# Patient Record
Sex: Female | Born: 1995 | Race: White | Hispanic: No | Smoking: Never smoker
Health system: Southern US, Community
[De-identification: ages and names within clinical notes are randomized; demographics above are authoritative.]

## PROBLEM LIST (undated history)

## (undated) ENCOUNTER — Inpatient Hospital Stay (HOSPITAL_COMMUNITY): Payer: Self-pay

## (undated) DIAGNOSIS — M1990 Unspecified osteoarthritis, unspecified site: Secondary | ICD-10-CM

## (undated) DIAGNOSIS — F32A Depression, unspecified: Secondary | ICD-10-CM

## (undated) DIAGNOSIS — F329 Major depressive disorder, single episode, unspecified: Secondary | ICD-10-CM

## (undated) DIAGNOSIS — F419 Anxiety disorder, unspecified: Secondary | ICD-10-CM

## (undated) DIAGNOSIS — F209 Schizophrenia, unspecified: Secondary | ICD-10-CM

## (undated) HISTORY — PX: ABDOMINAL HYSTERECTOMY: SHX81

## (undated) HISTORY — PX: TUBAL LIGATION: SHX77

## (undated) HISTORY — PX: HERNIA REPAIR: SHX51

---

## 2016-10-08 ENCOUNTER — Encounter: Payer: Self-pay | Admitting: Obstetrics and Gynecology

## 2016-10-22 ENCOUNTER — Encounter: Payer: Self-pay | Admitting: *Deleted

## 2016-10-25 ENCOUNTER — Ambulatory Visit (INDEPENDENT_AMBULATORY_CARE_PROVIDER_SITE_OTHER): Payer: Managed Care, Other (non HMO) | Admitting: Obstetrics and Gynecology

## 2016-10-25 ENCOUNTER — Encounter: Payer: Self-pay | Admitting: Obstetrics and Gynecology

## 2016-10-25 VITALS — BP 100/68 | HR 68 | Ht 60.0 in | Wt 99.2 lb

## 2016-10-25 DIAGNOSIS — G8929 Other chronic pain: Secondary | ICD-10-CM | POA: Diagnosis not present

## 2016-10-25 DIAGNOSIS — R102 Pelvic and perineal pain: Secondary | ICD-10-CM | POA: Diagnosis not present

## 2016-10-25 DIAGNOSIS — Z3202 Encounter for pregnancy test, result negative: Secondary | ICD-10-CM | POA: Diagnosis not present

## 2016-10-25 MED ORDER — METRONIDAZOLE 500 MG PO TABS: 500.0000 mg | ORAL_TABLET | Freq: Two times a day (BID) | ORAL | 0 refills | Status: AC

## 2016-10-25 MED ORDER — POLYETHYLENE GLYCOL 3350 17 GM/SCOOP PO POWD: 17.0000 g | Freq: Every day | ORAL | 99 refills | Status: DC

## 2016-10-25 NOTE — Progress Notes (Addendum)
Patient ID: Jessica Poole, female   DOB: 05/03/1995, 21 y.o.   MRN: [id number]   [facility]  @DATE @            Patient name: Jessica Poole MRN [id number]  Date of birth: 09/24/1995  CC & HPI:  Jessica Poole is a 21 y.o. female presenting today for an annual exam and gradually worsening, constant, left sided, pelvic pain for the last year. Pt also endorses abdominal cramping and dyspareunia. Her pain is different than when she first started her periods at age 21. She has one child who is almost two. She never had pain like this before and reports getting the Mirena after giving birth to her child. She had the Mirena for ~ 3 months before having it removed. She had a regular period last month and then had intermittent bleeding last week. She is currently in pain. She notes her pain with intercourse is mainly with insertion. Her pain is exacerbated closer to her menstrual cycle.   She is in the [employer] and states her pain affects her ability to complete certain tasksThat are essential to her work as an [employer]  ROS:  ROS (+) pelvic pain (+) abdominal cramping (+) dyspareunia All systems are negative except as noted in the HPI and PMH.   Pertinent History Reviewed:   Reviewed: Significant for cesarean section Medical        History reviewed. No pertinent past medical history.                            Surgical Hx:    Past Surgical History:  Procedure Laterality Date  . CESAREAN SECTION    . HERNIA REPAIR     21 yrs old   Medications: Reviewed & Updated - see associated section                       Current Outpatient Prescriptions:  .  norgestrel-ethinyl estradiol (LO/OVRAL,CRYSELLE) 0.3-30 MG-MCG tablet, Take 1 tablet by mouth daily., Disp: , Rfl:    Social History: Reviewed -  reports that she has never smoked. She has never used smokeless tobacco.  Objective Findings:  Vitals: Blood pressure 100/68, pulse 68, height 5' (1.524 m), weight 99 lb  3.2 oz (45 kg), last menstrual period 09/18/2016. Body mass index is 19.37 kg/m.   Physical Examination: General appearance - alert, well appearing, and in no distress, oriented to person, place, and time and normal appearing weight Mental status - alert, oriented to person, place, and time, normal mood, behavior, speech, dress, motor activity, and thought processes, affect appropriate to mood Abdomen - soft, nondistended, no masses or organomegaly, Pain inside her left anterior superior iliac crest upon palpation there is tenderness that's at least several centimeters above the incision. It is hard for me to believe that the the surgical scar from the old C-section reach this far up on the fascia Pelvic - normal external genitalia, vulva, vagina, cervix, uterus and adnexa,  VULVA: normal appearing vulva with no masses, tenderness or lesions,  VAGINA: normal appearing vagina with normal color and discharge, no lesions,  CERVIX: normal appearing cervix with moderate amount of white ,"bubbly" secretions, wet prep and KOH were done UTERUS: uterus is normal size, shape, consistency and nontender, very anteflexed uterus, mobile the pain was significant on initial exam but the uterus gradually eased into a is uncomfortable position during the  exam ADNEXA: normal adnexa in size, right nontender and no masses, left adnexa tenderness  Positive whiff test. Negative trichomoniasis and negative yeast.   Assessment & Plan:   A:  1. Chronic pelvic pain  2. BV mild  P:  1. Pt is to keep a pain journal  2. Will discuss further options after monitoring pain 3. She is to take a stool softener and use MiraLAX 4. Was given an ACOG brochure on pelvic pain 5. Antibiotic metronidazole oral 7 days 6 patient may be considered for laparoscopy  By signing my name below, I, Diona Browner, attest that this documentation has been prepared under the direction and in the presence of Tilda Burrow,  MD. Electronically Signed: Diona Browner, Medical Scribe. 10/25/16. 2:02 PM.  I personally performed the services described in this documentation, which was SCRIBED in my presence. The recorded information has been reviewed and considered accurate. It has been edited as necessary during review. Tilda Burrow, MD

## 2016-10-27 DIAGNOSIS — R102 Pelvic and perineal pain: Principal | ICD-10-CM

## 2016-10-27 DIAGNOSIS — G8929 Other chronic pain: Secondary | ICD-10-CM | POA: Insufficient documentation

## 2016-10-27 LAB — POCT URINE PREGNANCY: PREG TEST UR: NEGATIVE

## 2016-11-22 ENCOUNTER — Ambulatory Visit (INDEPENDENT_AMBULATORY_CARE_PROVIDER_SITE_OTHER): Payer: Managed Care, Other (non HMO) | Admitting: Obstetrics and Gynecology

## 2016-11-22 ENCOUNTER — Encounter: Payer: Self-pay | Admitting: Obstetrics and Gynecology

## 2016-11-22 VITALS — BP 90/48 | HR 86 | Ht 60.0 in | Wt 100.6 lb

## 2016-11-22 DIAGNOSIS — G8929 Other chronic pain: Secondary | ICD-10-CM

## 2016-11-22 DIAGNOSIS — R102 Pelvic and perineal pain: Secondary | ICD-10-CM | POA: Diagnosis not present

## 2016-11-22 NOTE — Progress Notes (Addendum)
[facility] Visit  11/22/2016            Patient name: Jessica Poole MRN [id number]  Date of birth: Mar 22, 1995  CC & HPI:  Jessica Poole is a 21 y.o. female presenting today for f/u of chronic left-sided  pelvic pain occurring for 1 year. Pt has associated symptoms of abdominal cramping and dyspareunia (per chart review). Pt was seen in office for initial visit on 10/25/16, and advised to keep a pain journal, begin taking MiraLAX, and tx for mild BV: metronidazole. PT has taken ibuprofen with no relief. Pt kept pain journal for 2 weeks, beginning on 10/25/16. Pain Journal Entries: On 9/12, pain increased by urination and abd. wall tightening, pain goes into the night 15-17 minimal pain, returned upon awakening on 9/18 9/20 pt felt pulling sensation on left side  Pt rates pain 10/10, and says she was unable to leave her couch on the first day of her period due to the pain. The pain is the same during her period as when she  is not on her period. Pt says her period has been very heavy, which is unusual for her. Note patient on oral contraceptive  She went through 18 tampons in 3 days. Today her pain is mild. Her bowel movements ave improved, and increased in frequency (once a day since beginning MiraLAX). Pt denies diarrhea, and says her BM comes out easily.   Pt had Mirena inserted 10 wks after giving birth, and noticed her pain right after. IUD was in for 3 months before being removed.  Pt takes Cryselle for birth control. She has tried taking active pills for 4 weeks, but states she still bled heavily. Her last menstrual period began on 11/09/16.  Pt states the pain is still affecting her work with the [employer]. 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 Patient has an evasive tendency with regards to answering questions that is difficult to interpret All systems are negative except as noted in the  HPI and PMH.    Pertinent History Reviewed:   Reviewed: Significant for C-section, right sided inguinal hernia repair Medical        History reviewed. No pertinent past medical history.                            Surgical Hx:    Past Surgical History:  Procedure Laterality Date  . CESAREAN SECTION    . HERNIA REPAIR     21 yrs old   Medications: Reviewed & Updated - see associated section                       Current Outpatient Prescriptions:  .  polyethylene glycol powder (MIRALAX) powder, Take 17 g by mouth daily. To prevent constipation (Patient taking differently: Take 17 g by mouth as needed. To prevent constipation), Disp: 255 g, Rfl: prn   Social History: Reviewed -  reports that she has never smoked. She has never used smokeless tobacco.  Objective Findings:  Vitals: Blood pressure (!) 90/48, pulse 86, height 5' (1.524 m), weight 100 lb 9.6 oz (45.6 kg).  Physical Examination: General appearance - alert, well appearing, and in no distressMultiple tattoos, facial piercings. Patient has a nervous habit of chewing on her lip rings Mental status - alert, oriented to person, place, and  time Abdomen soft no masses or hernias appreciable  Pelvic -  VULVA: normal appearing vulva with no masses, tenderness or lesions, piercings with ring in place  VAGINA: normal appearing vagina with normal color and discharge, no lesions,  CERVIX: normal appearing cervix without discharge or lesions,  UTERUS: anteverted, mobile Careful bimanual exam to sort out her pain describes discomfort of vaginal sidewall contact as "normal " discomfort of GYN exam, bladder pressure unremarkable uterus anteflexed adnexa without masses ADNEXA: normal adnexa in size, nontender and no masses  Probably totally normal, worried well   Assessment & Plan:   A:  1. Chronic pelvic pain poorly defined by pt  P:  1. Pt to consider  alternatives, including continuous OCP at 30 mcg level discussedTo address  alleged heavy periods, and does decide how much of a workup she feels is appropriate. Given the vague nature of her complaints and the fact that they do not have a cyclic component that responds to her menstrual cycle, and that she is on oral contraceptives are not sure there is a GYN component to this chronic discomfort, but I do not wish to ignore her complaints or minimize them. the patient to consider options and return in 1 week 2. Return in 1 week for decision on laparoscopy  Note: Pt given Laparoscopy brochure The provider spent over 25 minutes with the visit , including previsit review, and documentation,with >than 50% spent in counseling and coordination of care.    By signing my name below, I, Izna Ahmed, attest that this documentation has been prepared under the direction and in the presence of Tilda Burrow, MD. Electronically Signed: Redge Gainer, Medical Scribe. 11/22/16. 4:49 PM.  I personally performed the services described in this documentation, which was SCRIBED in my presence. The recorded information has been reviewed and considered accurate. It has been edited as necessary during review. Tilda Burrow, MD

## 2016-12-01 ENCOUNTER — Ambulatory Visit (INDEPENDENT_AMBULATORY_CARE_PROVIDER_SITE_OTHER): Payer: Managed Care, Other (non HMO) | Admitting: Obstetrics and Gynecology

## 2016-12-01 ENCOUNTER — Encounter: Payer: Self-pay | Admitting: Obstetrics and Gynecology

## 2016-12-01 VITALS — BP 100/80 | HR 74 | Wt 103.6 lb

## 2016-12-01 DIAGNOSIS — R1032 Left lower quadrant pain: Secondary | ICD-10-CM

## 2016-12-01 DIAGNOSIS — G8929 Other chronic pain: Secondary | ICD-10-CM | POA: Diagnosis not present

## 2016-12-01 NOTE — Progress Notes (Signed)
Patient ID: Jessica Poole, female   DOB: 02/21/1995, 21 y.o.   MRN: [id number] Preoperative History and Physical  Jessica Poole is a 21 y.o. G1P1001 here for surgical management  of chronic left-sided pelvic pain occurring for 1 year. Pt has associated symptoms of abdominal cramping and dyspareunia (per chart review). She was last seen 11/22/2016 for same symptoms. She states that palpation to the area will increase her discomfort. 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 No significant preoperative concerns.  Proposed surgery: diagnostic laparoscopy, possible laparoscopic salpingo-oophorectomy (left)  History reviewed. No pertinent past medical history. Past Surgical History:  Procedure Laterality Date  . CESAREAN SECTION    . HERNIA REPAIR     21 yrs old   OB History  Gravida Para Term Preterm AB Living  1 1 1  0 0 1  SAB TAB Ectopic Multiple Live Births          1    # Outcome Date GA Lbr Len/2nd Weight Sex Delivery Anes PTL Lv  1 Term     M CS-LTranv   LIV    Patient denies any other pertinent gynecologic issues.   Current Outpatient Prescriptions on File Prior to Visit  Medication Sig Dispense Refill  . polyethylene glycol powder (MIRALAX) powder Take 17 g by mouth daily. To prevent constipation (Patient not taking: Reported on 12/01/2016) 255 g prn   No current facility-administered medications on file prior to visit.    Allergies  Allergen Reactions  . Latex Rash    Social History:   reports that she has never smoked. She has never used smokeless tobacco. She reports that she drinks alcohol. She reports that she does not use drugs.  Family History  Problem Relation Age of Onset  . Heart disease Paternal Grandfather   . Depression Maternal Grandmother   . Drug abuse Father   . Hypertension Father     Review of Systems: Noncontributory  PHYSICAL EXAM: Blood pressure 100/80, pulse 74, weight 103 lb 9.6 oz  (47 kg), last menstrual period 11/09/2016. General appearance - alert, well appearing, and in no distress, multiple nervous ticks, somewhat poor hygiene  Chest - clear to auscultation, no wheezes, rales or rhonchi, symmetric air entry Heart - normal rate and regular rhythm Abdomen - soft, nontender, nondistended, no masses or organomegaly Pelvic - examination: small 1.5 - 2 cm firm mass causing severe pain Extremities - peripheral pulses normal, no pedal edema, no clubbing or cyanosis  Labs: No results found for this or any previous visit (from the past 336 hour(s)).  Imaging Studies: No results found.  Assessment: Patient Active Problem List   Diagnosis Date Noted  . Chronic pelvic pain in female 10/27/2016  . Pregnancy examination or test, negative result 10/25/2016    Plan: Patient will undergo surgical management with diagnostic laparoscopy, possible laparoscopic left salpingo-oophorectomy   .mec 12/01/2016 3:20 PM  By signing my name below, I, Diona BrownerJennifer Gorman, attest that this documentation has been prepared under the direction and in the presence of Tilda BurrowFerguson, Levorn Oleski V, MD. Electronically Signed: Diona BrownerJennifer Gorman, Medical Scribe. 12/01/16. 3:20 PM.  I personally performed the services described in this documentation, which was SCRIBED in my presence. The recorded information has been reviewed and considered accurate. It has been edited as necessary during review. Tilda BurrowFERGUSON,Aviyon Hocevar V, MD

## 2016-12-13 NOTE — Patient Instructions (Signed)
Jessica Poole  12/13/2016     @PREFPERIOPPHARMACY @   Your procedure is scheduled on  12/21/2016 .  Report to [name] at  715   A.M.  Call this number if you have problems the morning of surgery:  [phone]   Remember:  Do not eat food or drink liquids after midnight.  Take these medicines the morning of surgery with A SIP OF WATER None   Do not wear jewelry, make-up or nail polish.  Do not wear lotions, powders, or perfumes, or deoderant.  Do not shave 48 hours prior to surgery.  Men may shave face and neck.  Do not bring valuables to the hospital.  [facility] is not responsible for any belongings or valuables.  Contacts, dentures or bridgework may not be worn into surgery.  Leave your suitcase in the car.  After surgery it may be brought to your room.  For patients admitted to the hospital, discharge time will be determined by your treatment team.  Patients discharged the day of surgery will not be allowed to drive home.   Name and phone number of your driver:   family Special instructions:  None  Please read over the following fact sheets that you were given. Anesthesia Post-op Instructions and Care and Recovery After Surgery       Unilateral Salpingo-Oophorectomy Unilateral salpingo-oophorectomy is the surgical removal of one fallopian tube and ovary. The ovaries are small organs that produce eggs in women. The fallopian tubes transport the egg from the ovary to the womb (uterus). A unilateral salpingo-oophorectomy may be done for various reasons, including:  Infection in the fallopian tube and ovary.  Scar tissue in the fallopian tube and ovary (adhesions).  A cyst or tumor on the ovary.  A need to remove the fallopian tube and ovary when removing the uterus.  Cancer of the fallopian tube or ovary.  The removal of one fallopian tube and ovary will not prevent you from becoming pregnant, put you into menopause, or cause problems with  your menstrual periods or sex drive. Tell a health care provider about:  Any allergies you have.  All medicines you are taking, including vitamins, herbs, eye drops, creams, and over-the-counter medicines.  Previous problems you or family members have had with the use of anesthetics.  Any blood disorders you have.  Previous surgeries you have had.  Any medical conditions you have. What are the risks? Generally, this is a safe procedure. However, as with any procedure, complications can occur. Possible complications include:  Injury to surrounding organs.  Bleeding.  Infection.  Blood clots in the legs or lungs.  Problems related to anesthesia.  What happens before the procedure?  Ask your health care provider about changing or stopping your regular medicines. You may need to stop taking certain medicines, such as aspirin or blood thinners, at least 1 week before the surgery.  Do not eat or drink anything for at least 8 hours before the surgery.  If you smoke, do not smoke for at least 2 weeks before the surgery.  Make plans to have someone drive you home after the procedure or after your hospital stay. Also arrange for someone to help you with activities during recovery. What happens during the procedure?  You will be given medicine to help you relax before the procedure (sedative). You will then be given medicine to make you sleep through the procedure (general anesthetic). These medicines will  be given through an IV access tube that is put into one of your veins.  Once you are asleep, your lower abdomen will be shaved and cleaned. A thin, flexible tube (catheter) will be placed in your bladder.  The surgeon may use a laparoscopic, robotic, or open technique for this surgery: ? In the laparoscopic technique, the surgery is done through two small cuts (incisions) in the abdomen. A thin, lighted tube with a tiny camera on the end (laparoscope) is inserted into one of the  incisions. The tools needed for the procedure are put through the other incision. ? A robotic technique may be chosen to perform complex surgery in a small space. In the robotic technique, small incisions are made. A camera and surgical instruments are passed through the incisions. Surgical instruments are controlled with the help of a robotic arm. ? In the open technique, the surgery is done through one large incision in the abdomen.  Using any of these techniques, the surgeon will remove the fallopian tube and ovary. The blood vessels will be clamped and tied.  The surgeon will then use staples or stitches to close the incision or incisions. What happens after the procedure?  You will be taken to a recovery area where your progress will be monitored for 1-3 hours. Your blood pressure, pulse, and temperature will be checked often. You will remain in the recovery area until you are stable and waking up.  If the laparoscopic technique was used, you may be allowed to go home after several hours. You may have some shoulder pain. This is normal and usually goes away in a day or two.  If the open technique was used, you will be admitted to the hospital for a couple of days.  You will be given pain medicine as necessary.  The IV tube and catheter will be removed before you are discharged. This information is not intended to replace advice given to you by your health care provider. Make sure you discuss any questions you have with your health care provider. Document Released: 11/29/2008 Document Revised: 01/02/2016 Document Reviewed: 07/26/2012 Elsevier Interactive Patient Education  2018 [employer] Unilateral Salpingo-Oophorectomy, Care After Refer to this sheet in the next few weeks. These instructions provide you with information on caring for yourself after your procedure. Your health care provider may also give you more specific instructions. Your treatment has been planned according to  current medical practices, but problems sometimes occur. Call your health care provider if you have any problems or questions after your procedure. What can I expect after the procedure? After the procedure, it is typical to have the following:  Abdominal pain that can be controlled with pain medicine.  Vaginal spotting.  Constipation.  Follow these instructions at home:  Get plenty of rest and sleep.  Only take over-the-counter or prescription medicines as directed by your health care provider. Do not take aspirin. It can cause bleeding.  Keep incision areas clean and dry. Remove or change any bandages (dressings) only as directed by your health care provider.  Follow your health care provider's advice regarding diet.  Drink enough fluids to keep your urine clear or pale yellow.  Limit exercise and activities as directed by your health care provider. Do not lift anything heavier than 5 pounds (2.3 kg) until your health care provider approves.  Do not drive until your health care provider approves.  Do not drink alcohol until your health care provider approves.  Do not have sexual intercourse  until your health care provider says it is OK.  Take your temperature twice a day and write it down.  If you become constipated, you may: ? Ask your health care provider about taking a mild laxative. ? Add more fruit and bran to your diet. ? Drink more fluids.  Follow up with your health care provider as directed. Contact a health care provider if:  You have swelling or redness in the incision area.  You develop a rash.  You feel lightheaded.  You have pain that is not controlled with medicine.  You have pain, swelling, or redness where the IV access tube was placed. Get help right away if:  You have a fever.  You develop increasing abdominal pain.  You see pus coming out of the incision, or the incision is separating.  You notice a bad smell coming from the wound or  dressing.  You have excessive vaginal bleeding.  You feel sick to your stomach (nauseous) and vomit.  You have leg or chest pain.  You have pain when you urinate.  You develop shortness of breath.  You pass out. This information is not intended to replace advice given to you by your health care provider. Make sure you discuss any questions you have with your health care provider. Document Released: 11/28/2008 Document Revised: 01/02/2016 Document Reviewed: 07/26/2012 Elsevier Interactive Patient Education  2018 [employer].  General Anesthesia, Adult General anesthesia is the use of medicines to make a person "go to sleep" (be unconscious) for a medical procedure. General anesthesia is often recommended when a procedure:  Is long.  Requires you to be still or in an unusual position.  Is major and can cause you to lose blood.  Is impossible to do without general anesthesia.  The medicines used for general anesthesia are called general anesthetics. In addition to making you sleep, the medicines:  Prevent pain.  Control your blood pressure.  Relax your muscles.  Tell a health care provider about:  Any allergies you have.  All medicines you are taking, including vitamins, herbs, eye drops, creams, and over-the-counter medicines.  Any problems you or family members have had with anesthetic medicines.  Types of anesthetics you have had in the past.  Any bleeding disorders you have.  Any surgeries you have had.  Any medical conditions you have.  Any history of heart or lung conditions, such as heart failure, sleep apnea, or chronic obstructive pulmonary disease (COPD).  Whether you are pregnant or may be pregnant.  Whether you use tobacco, alcohol, marijuana, or street drugs.  Any history of Financial [employer].  Any history of depression or anxiety. What are the risks? Generally, this is a safe procedure. However, problems may occur, including:  Allergic  reaction to anesthetics.  Lung and heart problems.  Inhaling food or liquids from your stomach into your lungs (aspiration).  Injury to nerves.  Waking up during your procedure and being unable to move (rare).  Extreme agitation or a state of mental confusion (delirium) when you wake up from the anesthetic.  Air in the bloodstream, which can lead to stroke.  These problems are more likely to develop if you are having a major surgery or if you have an advanced medical condition. You can prevent some of these complications by answering all of your health care provider's questions thoroughly and by following all pre-procedure instructions. General anesthesia can cause side effects, including:  Nausea or vomiting  A sore throat from the breathing tube.  Feeling cold or shivery.  Feeling tired, washed out, or achy.  Sleepiness or drowsiness.  Confusion or agitation.  What happens before the procedure? Staying hydrated Follow instructions from your health care provider about hydration, which may include:  Up to 2 hours before the procedure - you may continue to drink clear liquids, such as water, clear fruit juice, black coffee, and plain tea.  Eating and drinking restrictions Follow instructions from your health care provider about eating and drinking, which may include:  8 hours before the procedure - stop eating heavy meals or foods such as meat, fried foods, or fatty foods.  6 hours before the procedure - stop eating light meals or foods, such as toast or cereal.  6 hours before the procedure - stop drinking milk or drinks that contain milk.  2 hours before the procedure - stop drinking clear liquids.  Medicines  Ask your health care provider about: ? Changing or stopping your regular medicines. This is especially important if you are taking diabetes medicines or blood thinners. ? Taking medicines such as aspirin and ibuprofen. These medicines can thin your blood. Do  not take these medicines before your procedure if your health care provider instructs you not to. ? Taking new dietary supplements or medicines. Do not take these during the week before your procedure unless your health care provider approves them.  If you are told to take a medicine or to continue taking a medicine on the day of the procedure, take the medicine with sips of water. General instructions   Ask if you will be going home the same day, the following day, or after a longer hospital stay. ? Plan to have someone take you home. ? Plan to have someone stay with you for the first 24 hours after you leave the hospital or clinic.  For 3-6 weeks before the procedure, try not to use any tobacco products, such as cigarettes, chewing tobacco, and e-cigarettes.  You may brush your teeth on the morning of the procedure, but make sure to spit out the toothpaste. What happens during the procedure?  You will be given anesthetics through a mask and through an IV tube in one of your veins.  You may receive medicine to help you relax (sedative).  As soon as you are asleep, a breathing tube may be used to help you breathe.  An anesthesia specialist will stay with you throughout the procedure. He or she will help keep you comfortable and safe by continuing to give you medicines and adjusting the amount of medicine that you get. He or she will also watch your blood pressure, pulse, and oxygen levels to make sure that the anesthetics do not cause any problems.  If a breathing tube was used to help you breathe, it will be removed before you wake up. The procedure may vary among health care providers and hospitals. What happens after the procedure?  You will wake up, often slowly, after the procedure is complete, usually in a recovery area.  Your blood pressure, heart rate, breathing rate, and blood oxygen level will be monitored until the medicines you were given have worn off.  You may be given  medicine to help you calm down if you feel anxious or agitated.  If you will be going home the same day, your health care provider may check to make sure you can stand, drink, and urinate.  Your health care providers will treat your pain and side effects before you go home.  Do not drive for 24 hours if you received a sedative.  You may: ? Feel nauseous and vomit. ? Have a sore throat. ? Have mental slowness. ? Feel cold or shivery. ? Feel sleepy. ? Feel tired. ? Feel sore or achy, even in parts of your body where you did not have surgery. This information is not intended to replace advice given to you by your health care provider. Make sure you discuss any questions you have with your health care provider. Document Released: 05/11/2007 Document Revised: 07/15/2015 Document Reviewed: 01/16/2015 Elsevier Interactive Patient Education  2018 [employer]. General Anesthesia, Adult, Care After These instructions provide you with information about caring for yourself after your procedure. Your health care provider may also give you more specific instructions. Your treatment has been planned according to current medical practices, but problems sometimes occur. Call your health care provider if you have any problems or questions after your procedure. What can I expect after the procedure? After the procedure, it is common to have:  Vomiting.  A sore throat.  Mental slowness.  It is common to feel:  Nauseous.  Cold or shivery.  Sleepy.  Tired.  Sore or achy, even in parts of your body where you did not have surgery.  Follow these instructions at home: For at least 24 hours after the procedure:  Do not: ? Participate in activities where you could fall or become injured. ? Drive. ? Use heavy machinery. ? Drink alcohol. ? Take sleeping pills or medicines that cause drowsiness. ? Make important decisions or sign legal documents. ? Take care of children on your  own.  Rest. Eating and drinking  If you vomit, drink water, juice, or soup when you can drink without vomiting.  Drink enough fluid to keep your urine clear or pale yellow.  Make sure you have little or no nausea before eating solid foods.  Follow the diet recommended by your health care provider. General instructions  Have a responsible adult stay with you until you are awake and alert.  Return to your normal activities as told by your health care provider. Ask your health care provider what activities are safe for you.  Take over-the-counter and prescription medicines only as told by your health care provider.  If you smoke, do not smoke without supervision.  Keep all follow-up visits as told by your health care provider. This is important. Contact a health care provider if:  You continue to have nausea or vomiting at home, and medicines are not helpful.  You cannot drink fluids or start eating again.  You cannot urinate after 8-12 hours.  You develop a skin rash.  You have fever.  You have increasing redness at the site of your procedure. Get help right away if:  You have difficulty breathing.  You have chest pain.  You have unexpected bleeding.  You feel that you are having a life-threatening or urgent problem. This information is not intended to replace advice given to you by your health care provider. Make sure you discuss any questions you have with your health care provider. Document Released: 05/10/2000 Document Revised: 07/07/2015 Document Reviewed: 01/16/2015 Elsevier Interactive Patient Education  [employer].

## 2016-12-15 ENCOUNTER — Encounter (HOSPITAL_COMMUNITY)
Admission: RE | Admit: 2016-12-15 | Discharge: 2016-12-15 | Disposition: A | Discharge status: Home / Self Care | Payer: 59 | Source: Ambulatory Visit | Attending: Obstetrics and Gynecology | Admitting: Obstetrics and Gynecology

## 2016-12-15 ENCOUNTER — Encounter (HOSPITAL_COMMUNITY): Payer: Self-pay

## 2016-12-15 ENCOUNTER — Other Ambulatory Visit: Payer: Self-pay | Admitting: Obstetrics and Gynecology

## 2016-12-15 DIAGNOSIS — Z01818 Encounter for other preprocedural examination: Secondary | ICD-10-CM | POA: Diagnosis present

## 2016-12-15 HISTORY — DX: Depression, unspecified: F32.A

## 2016-12-15 HISTORY — DX: Major depressive disorder, single episode, unspecified: F32.9

## 2016-12-15 HISTORY — DX: Unspecified osteoarthritis, unspecified site: M19.90

## 2016-12-15 HISTORY — DX: Anxiety disorder, unspecified: F41.9

## 2016-12-15 LAB — CBC
HEMATOCRIT: 41 % (ref 36.0–46.0)
Hemoglobin: 14 g/dL (ref 12.0–15.0)
MCH: 32.3 pg (ref 26.0–34.0)
MCHC: 34.1 g/dL (ref 30.0–36.0)
MCV: 94.5 fL (ref 78.0–100.0)
PLATELETS: 191 10*3/uL (ref 150–400)
RBC: 4.34 MIL/uL (ref 3.87–5.11)
RDW: 12.3 % (ref 11.5–15.5)
WBC: 5.7 10*3/uL (ref 4.0–10.5)

## 2016-12-15 LAB — COMPREHENSIVE METABOLIC PANEL
ALBUMIN: 4.6 g/dL (ref 3.5–5.0)
ALT: 16 U/L (ref 14–54)
ANION GAP: 10 (ref 5–15)
AST: 26 U/L (ref 15–41)
Alkaline Phosphatase: 72 U/L (ref 38–126)
BILIRUBIN TOTAL: 0.6 mg/dL (ref 0.3–1.2)
BUN: 15 mg/dL (ref 6–20)
CHLORIDE: 106 mmol/L (ref 101–111)
CO2: 23 mmol/L (ref 22–32)
Calcium: 9.2 mg/dL (ref 8.9–10.3)
Creatinine, Ser: 0.67 mg/dL (ref 0.44–1.00)
GFR calc Af Amer: >60 mL/min (ref >60)
GFR calc non Af Amer: >60 mL/min (ref >60)
GLUCOSE: 77 mg/dL (ref 65–99)
POTASSIUM: 4 mmol/L (ref 3.5–5.1)
Sodium: 139 mmol/L (ref 135–145)
TOTAL PROTEIN: 7.9 g/dL (ref 6.5–8.1)

## 2016-12-15 NOTE — Progress Notes (Unsigned)
Preoperative History and Physical  Jessica CotaSamantha B Jones is a 21 y.o. G1P1001 here for surgical management of chronic left-sided pelvic pain occurring for 1 year. Pt has associated symptoms of abdominal cramping and dyspareunia (per chart review). She was last seen 11/22/2016 for same symptoms. She states that palpation to the area will increase her discomfort. 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 No significant preoperative concerns.  Proposed surgery: diagnostic laparoscopy, possible laparoscopic salpingo-oophorectomy (left)  History reviewed. No pertinent past medical history.      Past Surgical History:  Procedure Laterality Date  . CESAREAN SECTION    . HERNIA REPAIR     21 yrs old                   OB History  Gravida Para Term Preterm AB Living  1 1 1  0 0 1  SAB TAB Ectopic Multiple Live Births          1    # Outcome Date GA Lbr Len/2nd Weight Sex Delivery Anes PTL Lv  1 Term     M CS-LTranv   LIV    Patient denies any other pertinent gynecologic issues.         Current Outpatient Prescriptions on File Prior to Visit  Medication Sig Dispense Refill  . polyethylene glycol powder (MIRALAX) powder Take 17 g by mouth daily. To prevent constipation (Patient not taking: Reported on 12/01/2016) 255 g prn   No current facility-administered medications on file prior to visit.        Allergies  Allergen Reactions  . Latex Rash    Social History:   reports that she has never smoked. She has never used smokeless tobacco. She reports that she drinks alcohol. She reports that she does not use drugs.       Family History  Problem Relation Age of Onset  . Heart disease Paternal Grandfather   . Depression Maternal Grandmother   . Drug abuse Father   . Hypertension Father     Review of Systems: Noncontributory  PHYSICAL EXAM: Blood pressure 100/80, pulse 74, weight 103 lb 9.6 oz (47 kg),  last menstrual period 11/09/2016. General appearance - alert, well appearing, and in no distress, multiple nervous ticks, somewhat poor hygiene  Chest - clear to auscultation, no wheezes, rales or rhonchi, symmetric air entry Heart - normal rate and regular rhythm Abdomen - soft, nontender, nondistended, no masses or organomegaly Pelvic - examination: small 1.5 - 2 cm firm mass causing severe pain Extremities - peripheral pulses normal, no pedal edema, no clubbing or cyanosis  Labs: No results found for this or any previous visit (from the past 336 hour(s)).  Imaging Studies: ImagingResults  No results found.    Assessment:     Patient Active Problem List   Diagnosis Date Noted  . Chronic pelvic pain in female 10/27/2016  . Pregnancy examination or test, negative result 10/25/2016    Plan: Patient will undergo surgical management with diagnostic laparoscopy, possible laparoscopic left salpingo-oophorectomy   .mec 12/01/2016 3:20 PM  By signing my name below, I, Diona BrownerJennifer Gorman, attest that this documentation has been prepared under the direction and in the presence of Tilda BurrowFerguson, Shayra Anton V, MD. Electronically Signed: Diona BrownerJennifer Gorman, Medical Scribe. 12/01/16. 3:20 PM.  I personally performed the services described in this documentation, which was SCRIBED in my presence. The recorded information has been reviewed and considered accurate. It has been  edited as necessary during review. Tilda Burrow, MD

## 2016-12-21 ENCOUNTER — Ambulatory Visit (HOSPITAL_COMMUNITY): Payer: 59 | Admitting: Anesthesiology

## 2016-12-21 ENCOUNTER — Ambulatory Visit (HOSPITAL_COMMUNITY)
Admission: RE | Admit: 2016-12-21 | Discharge: 2016-12-21 | Disposition: A | Discharge status: Home / Self Care | Payer: 59 | Source: Ambulatory Visit | Attending: Obstetrics and Gynecology | Admitting: Obstetrics and Gynecology

## 2016-12-21 ENCOUNTER — Encounter (HOSPITAL_COMMUNITY): Payer: Self-pay | Admitting: *Deleted

## 2016-12-21 ENCOUNTER — Encounter (HOSPITAL_COMMUNITY)
Admission: RE | Disposition: A | Discharge status: Home / Self Care | Payer: Self-pay | Source: Ambulatory Visit | Attending: Obstetrics and Gynecology

## 2016-12-21 DIAGNOSIS — N994 Postprocedural pelvic peritoneal adhesions: Secondary | ICD-10-CM | POA: Diagnosis not present

## 2016-12-21 DIAGNOSIS — N83202 Unspecified ovarian cyst, left side: Secondary | ICD-10-CM | POA: Insufficient documentation

## 2016-12-21 DIAGNOSIS — N736 Female pelvic peritoneal adhesions (postinfective): Secondary | ICD-10-CM | POA: Diagnosis present

## 2016-12-21 DIAGNOSIS — R1032 Left lower quadrant pain: Secondary | ICD-10-CM | POA: Diagnosis not present

## 2016-12-21 HISTORY — PX: LAPAROSCOPIC SALPINGO OOPHERECTOMY: SHX5927

## 2016-12-21 HISTORY — PX: LAPAROSCOPIC LYSIS OF ADHESIONS: SHX5905

## 2016-12-21 HISTORY — PX: LAPAROSCOPY: SHX197

## 2016-12-21 LAB — PREGNANCY, URINE: Preg Test, Ur: NEGATIVE

## 2016-12-21 SURGERY — SALPINGO-OOPHORECTOMY, LAPAROSCOPIC
Anesthesia: General | Laterality: Left

## 2016-12-21 MED ORDER — ONDANSETRON HCL 4 MG/2ML IJ SOLN
INTRAMUSCULAR | Status: DC | PRN
  Administered 2016-12-21: 4 mg via INTRAVENOUS

## 2016-12-21 MED ORDER — 0.9 % SODIUM CHLORIDE (POUR BTL) OPTIME
TOPICAL | Status: DC | PRN
  Administered 2016-12-21: 1000 mL

## 2016-12-21 MED ORDER — FENTANYL CITRATE (PF) 100 MCG/2ML IJ SOLN: 25.0000 ug | INTRAMUSCULAR | Status: DC | PRN

## 2016-12-21 MED ORDER — GLYCOPYRROLATE 0.2 MG/ML IJ SOLN
INTRAMUSCULAR | Status: AC
  Filled 2016-12-21: qty 1

## 2016-12-21 MED ORDER — OXYCODONE-ACETAMINOPHEN 5-325 MG PO TABS: 1.0000 | ORAL_TABLET | ORAL | 0 refills | Status: DC | PRN

## 2016-12-21 MED ORDER — FENTANYL CITRATE (PF) 100 MCG/2ML IJ SOLN
INTRAMUSCULAR | Status: DC | PRN
  Administered 2016-12-21: 25 ug via INTRAVENOUS
  Administered 2016-12-21: 50 ug via INTRAVENOUS
  Administered 2016-12-21: 100 ug via INTRAVENOUS
  Administered 2016-12-21: 50 ug via INTRAVENOUS
  Administered 2016-12-21: 25 ug via INTRAVENOUS

## 2016-12-21 MED ORDER — NEOSTIGMINE METHYLSULFATE 10 MG/10ML IV SOLN
INTRAVENOUS | Status: AC
  Filled 2016-12-21: qty 1

## 2016-12-21 MED ORDER — OXYCODONE HCL 5 MG PO TABS
5.0000 mg | ORAL_TABLET | Freq: Once | ORAL | Status: AC | PRN
  Administered 2016-12-21: 5 mg via ORAL
  Filled 2016-12-21: qty 1

## 2016-12-21 MED ORDER — GLYCOPYRROLATE 0.2 MG/ML IJ SOLN
INTRAMUSCULAR | Status: DC | PRN
  Administered 2016-12-21: 0.6 mg via INTRAVENOUS

## 2016-12-21 MED ORDER — ROCURONIUM BROMIDE 100 MG/10ML IV SOLN
INTRAVENOUS | Status: DC | PRN
  Administered 2016-12-21: 35 mg via INTRAVENOUS

## 2016-12-21 MED ORDER — FENTANYL CITRATE (PF) 250 MCG/5ML IJ SOLN
INTRAMUSCULAR | Status: AC
  Filled 2016-12-21: qty 5

## 2016-12-21 MED ORDER — PROPOFOL 10 MG/ML IV BOLUS
INTRAVENOUS | Status: DC | PRN
  Administered 2016-12-21: 150 mg via INTRAVENOUS

## 2016-12-21 MED ORDER — BUPIVACAINE HCL (PF) 0.5 % IJ SOLN
INTRAMUSCULAR | Status: AC
  Filled 2016-12-21: qty 30

## 2016-12-21 MED ORDER — GLYCOPYRROLATE 0.2 MG/ML IJ SOLN
INTRAMUSCULAR | Status: AC
  Filled 2016-12-21: qty 5

## 2016-12-21 MED ORDER — CEFAZOLIN SODIUM-DEXTROSE 2-4 GM/100ML-% IV SOLN
2.0000 g | INTRAVENOUS | Status: AC
  Administered 2016-12-21: 2 g via INTRAVENOUS
  Filled 2016-12-21: qty 100

## 2016-12-21 MED ORDER — ROCURONIUM BROMIDE 50 MG/5ML IV SOLN
INTRAVENOUS | Status: AC
  Filled 2016-12-21: qty 1

## 2016-12-21 MED ORDER — LACTATED RINGERS IV SOLN
INTRAVENOUS | Status: DC
  Administered 2016-12-21: 08:00:00 via INTRAVENOUS

## 2016-12-21 MED ORDER — NEOSTIGMINE METHYLSULFATE 10 MG/10ML IV SOLN
INTRAVENOUS | Status: DC | PRN
  Administered 2016-12-21: 3 mg via INTRAVENOUS

## 2016-12-21 MED ORDER — LIDOCAINE HCL (PF) 1 % IJ SOLN
INTRAMUSCULAR | Status: AC
  Filled 2016-12-21: qty 5

## 2016-12-21 MED ORDER — IBUPROFEN 600 MG PO TABS: 600.0000 mg | ORAL_TABLET | Freq: Four times a day (QID) | ORAL | 1 refills | Status: DC | PRN

## 2016-12-21 MED ORDER — PROPOFOL 10 MG/ML IV BOLUS
INTRAVENOUS | Status: AC
  Filled 2016-12-21: qty 20

## 2016-12-21 MED ORDER — OXYCODONE HCL 5 MG/5ML PO SOLN: 5.0000 mg | Freq: Once | ORAL | Status: DC | PRN

## 2016-12-21 MED ORDER — BUPIVACAINE HCL (PF) 0.5 % IJ SOLN
INTRAMUSCULAR | Status: DC | PRN
  Administered 2016-12-21: 10 mL

## 2016-12-21 SURGICAL SUPPLY — 52 items
BAG HAMPER (MISCELLANEOUS) ×4 IMPLANT
BAG RETRIEVAL 10MM (BASKET) ×1
BANDAGE STRIP 1X3 FLEXIBLE (GAUZE/BANDAGES/DRESSINGS) ×16 IMPLANT
BLADE SURG SZ11 CARB STEEL (BLADE) ×4 IMPLANT
CLOSURE WOUND 1/4 X3 (GAUZE/BANDAGES/DRESSINGS) ×2
CLOTH BEACON ORANGE TIMEOUT ST (SAFETY) ×4 IMPLANT
COVER LIGHT HANDLE STERIS (MISCELLANEOUS) ×8 IMPLANT
DURAPREP 26ML APPLICATOR (WOUND CARE) ×4 IMPLANT
ELECT REM PT RETURN 9FT ADLT (ELECTROSURGICAL) ×4
ELECTRODE REM PT RTRN 9FT ADLT (ELECTROSURGICAL) ×2 IMPLANT
FILTER SMOKE EVAC LAPAROSHD (FILTER) ×4 IMPLANT
FORMALIN 10 PREFIL 120ML (MISCELLANEOUS) ×4 IMPLANT
GAUZE SPONGE 4X4 12PLY STRL (GAUZE/BANDAGES/DRESSINGS) ×4 IMPLANT
GLOVE BIOGEL PI IND STRL 7.0 (GLOVE) ×2 IMPLANT
GLOVE BIOGEL PI IND STRL 9 (GLOVE) ×2 IMPLANT
GLOVE BIOGEL PI INDICATOR 7.0 (GLOVE) ×2
GLOVE BIOGEL PI INDICATOR 9 (GLOVE) ×2
GLOVE ECLIPSE 9.0 STRL (GLOVE) ×4 IMPLANT
GLOVE SS PI 9.0 STRL (GLOVE) ×4 IMPLANT
GLOVE SURG SS PI 6.5 STRL IVOR (GLOVE) ×4 IMPLANT
GOWN SPEC L3 XXLG W/TWL (GOWN DISPOSABLE) ×8 IMPLANT
GOWN STRL REUS W/TWL LRG LVL3 (GOWN DISPOSABLE) ×4 IMPLANT
INST SET LAPROSCOPIC GYN AP (KITS) ×4 IMPLANT
IV NS IRRIG 3000ML ARTHROMATIC (IV SOLUTION) IMPLANT
KIT ROOM TURNOVER AP CYSTO (KITS) ×4 IMPLANT
KIT ROOM TURNOVER APOR (KITS) ×4 IMPLANT
MANIFOLD NEPTUNE II (INSTRUMENTS) ×4 IMPLANT
NEEDLE HYPO 25X1 1.5 SAFETY (NEEDLE) ×4 IMPLANT
NEEDLE INSUFFLATION 120MM (ENDOMECHANICALS) ×4 IMPLANT
NEEDLE INSUFFLATION 14GA 120MM (NEEDLE) ×4 IMPLANT
NS IRRIG 1000ML POUR BTL (IV SOLUTION) ×4 IMPLANT
PACK PERI GYN (CUSTOM PROCEDURE TRAY) ×4 IMPLANT
PAD ARMBOARD 7.5X6 YLW CONV (MISCELLANEOUS) ×4 IMPLANT
SET BASIN LINEN APH (SET/KITS/TRAYS/PACK) ×4 IMPLANT
SET TUBE IRRIG SUCTION NO TIP (IRRIGATION / IRRIGATOR) ×4 IMPLANT
SHEARS HARMONIC ACE PLUS 36CM (ENDOMECHANICALS) ×4 IMPLANT
SLEEVE ENDOPATH XCEL 5M (ENDOMECHANICALS) ×8 IMPLANT
SOLUTION ANTI FOG 6CC (MISCELLANEOUS) ×4 IMPLANT
STRIP CLOSURE SKIN 1/4X3 (GAUZE/BANDAGES/DRESSINGS) ×6 IMPLANT
SUT VIC AB 4-0 PS2 27 (SUTURE) ×4 IMPLANT
SUT VICRYL 0 UR6 27IN ABS (SUTURE) ×4 IMPLANT
SYR BULB IRRIGATION 50ML (SYRINGE) ×4 IMPLANT
SYRINGE 10CC LL (SYRINGE) ×4 IMPLANT
SYS BAG RETRIEVAL 10MM (BASKET) ×3
SYSTEM BAG RETRIEVAL 10MM (BASKET) ×2 IMPLANT
TRAY FOLEY BAG SILVER LF 16FR (SET/KITS/TRAYS/PACK) ×4 IMPLANT
TROCAR ENDO BLADELESS 11MM (ENDOMECHANICALS) ×4 IMPLANT
TROCAR XCEL NON-BLD 5MMX100MML (ENDOMECHANICALS) ×4 IMPLANT
TROCAR Z-THAD FIOS HNDL 12X100 (TROCAR) IMPLANT
TUBING INSUFFLATION (TUBING) ×4 IMPLANT
TUBING INSUFFLATION 10FT LAP (TUBING) ×4 IMPLANT
WARMER LAPAROSCOPE (MISCELLANEOUS) ×4 IMPLANT

## 2016-12-21 NOTE — Anesthesia Procedure Notes (Signed)
Procedure Name: Intubation Date/Time: 12/21/2016 9:22 AM Performed by: Vista Deck, CRNA Pre-anesthesia Checklist: Patient identified, Patient being monitored, Timeout performed, Emergency Drugs available and Suction available Patient Re-evaluated:Patient Re-evaluated prior to induction Oxygen Delivery Method: Circle System Utilized Preoxygenation: Pre-oxygenation with 100% oxygen Induction Type: IV induction Ventilation: Mask ventilation without difficulty Laryngoscope Size: Mac and 3 Grade View: Grade II Tube type: Oral Tube size: 7.0 mm Number of attempts: 1 Airway Equipment and Method: stylet Placement Confirmation: ETT inserted through vocal cords under direct vision,  positive ETCO2 and breath sounds checked- equal and bilateral Secured at: 21 cm Tube secured with: Tape Dental Injury: Teeth and Oropharynx as per pre-operative assessment  Comments: Plastic tongue piercing intact post intubation.

## 2016-12-21 NOTE — Anesthesia Postprocedure Evaluation (Signed)
Anesthesia Post Note  Patient: Jessica Poole  Procedure(s) Performed: LAPAROSCOPIC LEFT SALPINGO OOPHORECTOMY (Left ) LAPAROSCOPIC LYSIS OF ADHESIONS LAPAROSCOPY DIAGNOSTIC  Patient location during evaluation: PACU Anesthesia Type: General Level of consciousness: awake and alert Pain management: satisfactory to patient Vital Signs Assessment: post-procedure vital signs reviewed and stable Respiratory status: spontaneous breathing Cardiovascular status: stable Postop Assessment: no apparent nausea or vomiting and adequate PO intake Anesthetic complications: no     Last Vitals:  Vitals:   12/21/16 1046 12/21/16 1100  BP: (!) 129/98 (!) 130/94  Pulse: 85 81  Resp: 16 16  Temp: 36.5 C   SpO2: 100% 100%    Last Pain:  Vitals:   12/21/16 1100  TempSrc:   PainSc: 6                  Lovinia Snare

## 2016-12-21 NOTE — Transfer of Care (Signed)
Immediate Anesthesia Transfer of Care Note  Patient: Jessica Poole  Procedure(s) Performed: LAPAROSCOPIC LEFT SALPINGO OOPHORECTOMY (Left ) LAPAROSCOPIC LYSIS OF ADHESIONS LAPAROSCOPY DIAGNOSTIC  Patient Location: PACU  Anesthesia Type:General  Level of Consciousness: awake, alert  and oriented  Airway & Oxygen Therapy: Patient Spontanous Breathing  Post-op Assessment: Report given to RN and Post -op Vital signs reviewed and stable  Post vital signs: Reviewed and stable  Last Vitals:  Vitals:   12/21/16 0845 12/21/16 0900  BP: 112/68 105/75  Resp: 19 (!) 21  Temp:    SpO2: 98% 98%    Last Pain:  Vitals:   12/21/16 0727  TempSrc: Oral  PainSc: 8       Patients Stated Pain Goal: 8 (12/21/16 0727)  Complications: No apparent anesthesia complications

## 2016-12-21 NOTE — Discharge Instructions (Signed)
Diagnostic Laparoscopy A diagnostic laparoscopy is a procedure to diagnose diseases in the abdomen. During the procedure, a thin, lighted, pencil-sized instrument called a laparoscope is inserted into the abdomen through an incision. The laparoscope allows your health care provider to look at the organs inside your body. Tell a health care provider about:  Any allergies you have.  All medicines you are taking, including vitamins, herbs, eye drops, creams, and over-the-counter medicines.  Any problems you or family members have had with anesthetic medicines.  Any blood disorders you have.  Any surgeries you have had.  Any medical conditions you have. What are the risks? Generally, this is a safe procedure. However, problems can occur, which may include:  Infection.  Bleeding.  Damage to other organs.  Allergic reaction to the anesthetics used during the procedure.  What happens before the procedure?  Do not eat or drink anything after midnight on the night before the procedure or as directed by your health care provider.  Ask your health care provider about: ? Changing or stopping your regular medicines. ? Taking medicines such as aspirin and ibuprofen. These medicines can thin your blood. Do not take these medicines before your procedure if your health care provider instructs you not to.  Plan to have someone take you home after the procedure. What happens during the procedure?  You may be given a medicine to help you relax (sedative).  You will be given a medicine to make you sleep (general anesthetic).  Your abdomen will be inflated with a gas. This will make your organs easier to see.  Small incisions will be made in your abdomen.  A laparoscope and other small instruments will be inserted into the abdomen through the incisions.  A tissue sample may be removed from an organ in the abdomen for examination.  The instruments will be removed from the abdomen.  The  gas will be released.  The incisions will be closed with stitches (sutures). What happens after the procedure? Your blood pressure, heart rate, breathing rate, and blood oxygen level will be monitored often until the medicines you were given have worn off. This information is not intended to replace advice given to you by your health care provider. Make sure you discuss any questions you have with your health care provider. Document Released: 05/10/2000 Document Revised: 06/12/2015 Document Reviewed: 09/14/2013 Elsevier Interactive Patient Education  2018 [employer]  PATIENT INSTRUCTIONS POST-ANESTHESIA  IMMEDIATELY FOLLOWING SURGERY:  Do not drive or operate machinery for the first twenty four hours after surgery.  Do not make any important decisions for twenty four hours after surgery or while taking narcotic pain medications or sedatives.  If you develop intractable nausea and vomiting or a severe headache please notify your doctor immediately.  FOLLOW-UP:  Please make an appointment with your surgeon as instructed. You do not need to follow up with anesthesia unless specifically instructed to do so.  WOUND CARE INSTRUCTIONS (if applicable):  Keep a dry clean dressing on the anesthesia/puncture wound site if there is drainage.  Once the wound has quit draining you may leave it open to air.  Generally you should leave the bandage intact for twenty four hours unless there is drainage.  If the epidural site drains for more than 36-48 hours please call the anesthesia department.  QUESTIONS?:  Please feel free to call your physician or the hospital operator if you have any questions, and they will be happy to assist you.     Return to  Work Clydell HakimSamantha Poole was treated at our facility. I Return to work  Employee may return to work on Wednesday, December 29, 2016 Show this Return to Work statement to Proofreaderyour supervisor at work as soon as possible. Your employer should be aware of your condition  and can help with the necessary work activity restrictions. If you wish to return to work sooner than the date that is listed above, or if you have further problems that make it difficult for you to return at that time, please call our clinic or your health care provider. Dr. Emelda FearFerguson Health Care Provider Name (printed)   12/21/2016 Date This information is not intended to replace advice given to you by your health care provider. Make sure you discuss any questions you have with your health care provider. Document Released: 02/01/2005 Document Revised: 01/16/2016 Document Reviewed: 08/31/2013 Elsevier Interactive Patient Education  [employer]2018 [employer]vier Inc.

## 2016-12-21 NOTE — H&P (Signed)
Patient ID: Jessica Poole, female   DOB: 04/06/1995, 21 y.o.   MRN: [id number] Preoperative History and Physical  Jessica Poole is a 21 y.o. G1P1001 here for surgical management of chronic left-sided pelvic pain occurring for 1 year. Pt has associated symptoms of abdominal cramping and dyspareunia (per chart review). She was last seen 11/22/2016 for same symptoms. She states that palpation to the area will increase her discomfort. 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 No significant preoperative concerns.  Proposed surgery: diagnostic laparoscopy, possible laparoscopic salpingo-oophorectomy (left)  History reviewed. No pertinent past medical history.      Past Surgical History:  Procedure Laterality Date  . CESAREAN SECTION    . HERNIA REPAIR     21 yrs old                   OB History  Gravida Para Term Preterm AB Living  1 1 1  0 0 1  SAB TAB Ectopic Multiple Live Births          1    # Outcome Date GA Lbr Len/2nd Weight Sex Delivery Anes PTL Lv  1 Term     M CS-LTranv   LIV    Patient denies any other pertinent gynecologic issues.         Current Outpatient Prescriptions on File Prior to Visit  Medication Sig Dispense Refill  . polyethylene glycol powder (MIRALAX) powder Take 17 g by mouth daily. To prevent constipation (Patient not taking: Reported on 12/01/2016) 255 g prn   No current facility-administered medications on file prior to visit.        Allergies  Allergen Reactions  . Latex Rash    Social History:   reports that she has never smoked. She has never used smokeless tobacco. She reports that she drinks alcohol. She reports that she does not use drugs.  Family History  Problem Relation Age of Onset  . Heart disease Paternal Grandfather   . Depression Maternal Grandmother   . Drug abuse Father   . Hypertension Father     Review of Systems:  Noncontributory  PHYSICAL EXAM: Blood pressure 100/80, pulse 74, weight 103 lb 9.6 oz (47 kg), last menstrual period 11/09/2016. General appearance - alert, well appearing, and in no distress, multiple nervous ticks, somewhat poor hygiene  Chest - clear to auscultation, no wheezes, rales or rhonchi, symmetric air entry Heart - normal rate and regular rhythm Abdomen - soft, nontender, nondistended, no masses or organomegaly Pelvic - examination: small 1.5 - 2 cm firm mass in the LLQ  causing severe pain felt to represent the left ovary. Extremities - peripheral pulses normal, no pedal edema, no clubbing or cyanosis  Labs: CBC    Component Value Date/Time   WBC 5.7 12/15/2016 0803   RBC 4.34 12/15/2016 0803   HGB 14.0 12/15/2016 0803   HCT 41.0 12/15/2016 0803   PLT 191 12/15/2016 0803   MCV 94.5 12/15/2016 0803   MCH 32.3 12/15/2016 0803   MCHC 34.1 12/15/2016 0803   RDW 12.3 12/15/2016 0803   BMET    Component Value Date/Time   NA 139 12/15/2016 0803   K 4.0 12/15/2016 0803   CL 106 12/15/2016 0803   CO2 23 12/15/2016 0803   GLUCOSE 77 12/15/2016 0803   BUN 15 12/15/2016 0803   CREATININE 0.67 12/15/2016 0803   CALCIUM 9.2 12/15/2016 0803   GFRNONAA >60  12/15/2016 0803   GFRAA >60 12/15/2016 0803     Imaging Studies: ImagingResults  No results found.    Assessment:     Patient Active Problem List   Diagnosis Date Noted  . Chronic pelvic pain in female 10/27/2016  . Pregnancy examination or test, negative result 10/25/2016    Plan: Patient will undergo surgical management with diagnostic laparoscopy, possible laparoscopic left salpingo-oophorectomy. In final confirmatory discussions the day of surgery, the patient requests that the left tube and ovary be removed even if visibly normal, due to the severity of disability that she has experienced.   .mec 12/01/2016 3:20 PM  By signing my name below, I, Diona BrownerJennifer Gorman, attest that this documentation  has been prepared under the direction and in the presence of Tilda BurrowFerguson, Zebediah Beezley V, MD. Electronically Signed: Diona BrownerJennifer Gorman, Medical Scribe. 12/01/16. 3:20 PM.  I personally performed the services described in this documentation, which was SCRIBED in my presence. The recorded information has been reviewed and considered accurate. It has been edited as necessary during review. Tilda BurrowFERGUSON,Ainslee Sou V, MD

## 2016-12-21 NOTE — Op Note (Signed)
Please see the brief operative note for surgical details 

## 2016-12-21 NOTE — Op Note (Signed)
12/21/2016  2:40 PM  PATIENT:  Jessica CotaSamantha B Jones  21 y.o. female  PRE-OPERATIVE DIAGNOSIS:  chronic left lower quadrant pain  POST-OPERATIVE DIAGNOSIS:  chronic left lower quadrant pain, pelvic adhesions status post cesarean section  PROCEDURE:  Procedure(s): LAPAROSCOPIC LEFT SALPINGO OOPHORECTOMY (Left) LAPAROSCOPIC LYSIS OF ADHESIONS LAPAROSCOPY DIAGNOSTIC  SURGEON:  Surgeon(s) and Role:    Tilda Burrow* Latonja Bobeck V, MD - Primary  PHYSICIAN ASSISTANT:   ASSISTANTS: none   ANESTHESIA:   general  EBL:  0 mL   BLOOD ADMINISTERED:none  DRAINS: none   LOCAL MEDICATIONS USED:  MARCAINE     SPECIMEN:  Source of Specimen:  Tube and ovary left  DISPOSITION OF SPECIMEN:  PATHOLOGY  COUNTS:  YES  TOURNIQUET:  * No tourniquets in log *  DICTATION: .Dragon Dictation  PLAN OF CARE: Discharge to home after PACU  PATIENT DISPOSITION:  PACU - hemodynamically stable.   Delay start of Pharmacological VTE agent (>24hrs) due to surgical blood loss or risk of bleeding: yes Details of procedure patient was taken operating room prepped and draped for lower abdominal surgery. An infraumbilical vertical 1 Simmons measures skin incision was made as well as a transverse suprapubic 5 mm incision. Veress needle was used being particularly careful to oriented toward the pelvis while elevating the abdominal wall and intraperitoneal insufflation was performed under 4 mmHg pressure. Water droplet technique and then used to confirm intraperitoneal location. The 10 mm trocar was inserted through the umbilicus with care, identified extensive omental adhesions to the anterior abdominal wall the uterus and actually had more adhesions around the right adnexa than the left. Review trocar was placed and right lower quadrant and then also with suprapubic and then we used Harmonic Ace 7 scalpel to dissect all the omental adhesions from the anterior abdominal wall which gave dramatically improved visibility. Left adnexa  was relatively in remarkable. The adhesions were minimal. Lopid tube was normal in external appearance. The right side the tube was distorted and adherent to the upper site and side of the right round ligament but this could be easily taken down with harmonic K7 scalpel this resulted in reestablishing normal pelvic anatomy. As per patient request then oophorectomy and salpingectomy on the left side were performed. The tube and ovary were a little bit large but grossly normal in appearance otherwise. The tube could be elevated and then with the 5 mm Harmonic Ace 7 device through the right lower quadrant trocar site we were able to dissect across the proximal stump, the the left utero-ovarian ligament, and shell off the. We also went laterally and crossclamped and coagulated the left infundibulopelvic ligament closely adherent to the tube and ovary, well away from the retroperitoneum after we had specifically identified the left ureter with its peristalsis well away from the area of surgical concern. Once the tube and ovary were taken off and placed in front of the uterus and Endo Catch bag placed through the 10 mm umbilical site with a 5 mm camera used through one of the side port to maintain visibility. The tube and ovary could be then placed in specimen bag and extracted. This was accomplished quite easily. Pelvis was inspected and confirmed as hemostatic. Photos documenting this were taken. This time laparoscopic instruments were removed. The 3 trocar sites, left lower quadrant suprapubic and right lower quadrant were all 5 mm trocar sites and required suture subcuticular closure. The umbilical site was a 10-11 mm trocar and the edges of the fascia were grasped with  Allis clamps, elevated and sutures of 0 Vicryl used to close the fascia supraumbilical site. Subcuticular 4-0 Vicryl was then used to close the skin. Sponge and needle counts were correct

## 2016-12-21 NOTE — Progress Notes (Addendum)
Please excuse Jessica Poole from work until Wednesday November 14th 2018. She had a procedure at [facility] today.

## 2016-12-21 NOTE — Anesthesia Preprocedure Evaluation (Addendum)
Anesthesia Evaluation  Patient identified by MRN, date of birth, ID band Patient awake    Airway Mallampati: I       Dental no notable dental hx. (+) Teeth Intact   Pulmonary neg pulmonary ROS,    Pulmonary exam normal breath sounds clear to auscultation       Cardiovascular Exercise Tolerance: Good  Rhythm:Regular Rate:Normal     Neuro/Psych Anxiety    GI/Hepatic negative GI ROS, Neg liver ROS,   Endo/Other  negative endocrine ROS  Renal/GU negative Renal ROS     Musculoskeletal  (+) Arthritis ,   Abdominal Normal abdominal exam  (+)   Peds  Hematology   Anesthesia Other Findings Results for Andrey CotaJONES, Berdie B (MRN [id number]) as of 12/21/2016 08:32  12/21/2016 07:15 Preg Test, Ur: NEGATIVE   Reproductive/Obstetrics                           Anesthesia Physical Anesthesia Plan  ASA: II  Anesthesia Plan: General   Post-op Pain Management:    Induction:   PONV Risk Score and Plan: 3  Airway Management Planned: Oral ETT  Additional Equipment:   Intra-op Plan:   Post-operative Plan:   Informed Consent: I have reviewed the patients History and Physical, chart, labs and discussed the procedure including the risks, benefits and alternatives for the proposed anesthesia with the patient or authorized representative who has indicated his/her understanding and acceptance.     Plan Discussed with: CRNA  Anesthesia Plan Comments:         Anesthesia Quick Evaluation

## 2016-12-22 ENCOUNTER — Encounter (HOSPITAL_COMMUNITY): Payer: Self-pay | Admitting: Obstetrics and Gynecology

## 2016-12-29 ENCOUNTER — Ambulatory Visit (INDEPENDENT_AMBULATORY_CARE_PROVIDER_SITE_OTHER): Payer: 59 | Admitting: Obstetrics and Gynecology

## 2016-12-29 ENCOUNTER — Encounter: Payer: Self-pay | Admitting: Obstetrics and Gynecology

## 2016-12-29 VITALS — BP 110/80 | HR 96 | Wt 99.2 lb

## 2016-12-29 DIAGNOSIS — Z09 Encounter for follow-up examination after completed treatment for conditions other than malignant neoplasm: Secondary | ICD-10-CM

## 2016-12-29 DIAGNOSIS — Z9889 Other specified postprocedural states: Secondary | ICD-10-CM

## 2016-12-29 NOTE — Progress Notes (Signed)
   Subjective:  Jessica Poole is a 21 y.o. female now 1 weeks status post . Laparoscopic left salpingo-oophorectomywith lysis of adhesions. She's had some moderate discomfort postoperatively tolerating diet remained afebrile. She feels she is improving. On postop day 1 she was markedly uncomfortable   she's been afebrile and bowel function is normal Review of Systems Negative except    Diet:   regular   Bowel movements : normal.  Pain is controlled with current analgesics. Medications being used: narcotic analgesics including Vicodin.  Objective:  BP 110/80 (BP Location: Right Arm, Patient Position: Sitting, Cuff Size: Small)   Pulse 96   Wt 99 lb 3.2 oz (45 kg)   LMP 12/09/2016   BMI 19.37 kg/m  General:Well developed, well nourished.  No acute distress. Abdomen: Bowel sounds normal, soft, non-tender.well-healed abdominal scars Pelvic Exam:    External Genitalia:  Normal.    Vagina: Normal    Cervix: Normalpelvic  exam deferred    Uterus: Normal    Adnexa/Bimanual:   Incision(s):   Healing well from laparoscopic sites, no drainage, no erythema, no hernia, no swelling, no dehiscence,     Assessment:  Post-Op 1 weeks s/p laparoscopic left salpingo-oophorectomy with lysis of adhesions   stable postoperative course, low pain tolerance is Patient remained stable postoperatively.   Plan:  1.Wound care discussed   2. . current medications.refill Vicodin 3. Activity restrictions:   4. return to work: not applicable. 5. Follow up in 4 weeks.

## 2017-02-15 NOTE — L&D Delivery Note (Signed)
Operative Delivery Note At 7:02 PM a viable female was delivered via VBAC, Vacuum Assisted.  Presentation: vertex; Position: Occiput,; Station: +3.  Verbal consent: obtained from patient.  Risks and benefits discussed in detail.  Risks include, but are not limited to the risks of anesthesia, bleeding, infection, damage to maternal tissues, fetal cephalhematoma.  There is also the risk of inability to effect vaginal delivery of the head, or shoulder dystocia that cannot be resolved by established maneuvers, leading to the need for emergency cesarean section.  APGAR: 8, 8; weight 4 lb 6.6 oz (2000 g).   Placenta status: , .   Cord:  with the following complications: .  Cord pH: 7.12  Anesthesia:   Instruments: 3 different types of vacuum were attempted WITHOUT suction as neither would work due to the excessive amount of amniotic fluid. The bell suction was finally used with 2 pulls and no pop offs to provide a successful delviery     Episiotomy: Median Lacerations: 2nd degree;Perineal Suture Repair: 3.0 vicryl Est. Blood Loss (mL): 132  Mom to postpartum.  Baby to NICU. The baby will be transferred to another facility after being stabilized due to a preexisting anomaly.    Willodean RosenthalCarolyn Harraway-Smith 11/08/2017, 5:10 PM

## 2017-03-11 ENCOUNTER — Encounter: Payer: Self-pay | Admitting: Obstetrics and Gynecology

## 2017-03-11 ENCOUNTER — Ambulatory Visit (INDEPENDENT_AMBULATORY_CARE_PROVIDER_SITE_OTHER): Payer: 59 | Admitting: Obstetrics and Gynecology

## 2017-03-11 VITALS — BP 90/70 | HR 75 | Ht 60.0 in | Wt 104.6 lb

## 2017-03-11 DIAGNOSIS — R102 Pelvic and perineal pain: Secondary | ICD-10-CM

## 2017-03-11 DIAGNOSIS — Z309 Encounter for contraceptive management, unspecified: Secondary | ICD-10-CM

## 2017-03-11 DIAGNOSIS — N941 Unspecified dyspareunia: Secondary | ICD-10-CM

## 2017-03-11 DIAGNOSIS — Z3202 Encounter for pregnancy test, result negative: Secondary | ICD-10-CM

## 2017-03-11 LAB — POCT URINE PREGNANCY: Preg Test, Ur: NEGATIVE

## 2017-03-11 MED ORDER — NORETHIN ACE-ETH ESTRAD-FE 1-20 MG-MCG PO TABS: 1.0000 | ORAL_TABLET | Freq: Every day | ORAL | 11 refills | Status: DC

## 2017-03-11 NOTE — Progress Notes (Signed)
[facility] Visit  03/11/2017            Patient name: Jessica Poole MRN [id number]  Date of birth: 1995/12/31  CC & HPI:  FATMA RUTTEN is a 22 y.o. female presenting today for sharp uterine pain with onset of about 2 months ago, since her surgery. She had a laparoscopic left salpingo-oophorectomy and lysis of adhesions about 2 months ago (11/18). She has associated symptoms of dyspareunia and cramping. She feels a tightening when she is urinating. She will notice the pain throughout the day, and it will occasionally strengthen enough to make her sit down. She does feel cramping after sexual activity. She denies any discharge, or any other complaints.  She wants to discuss an option of birth control to control her period flow, or to stop it completely. She was previously using Cryselle, and has tried mirena and nexplanon. Her periods have been very heavy since her surgery, and slightly irregular. She is sexually active with a regular partner.  ROS:  ROS (+) sharp pelvic/uterine pain (+) dyspareunia All systems are negative except as noted in the HPI and PMH.   Pertinent History Reviewed:   Reviewed: Significant for C/S, left salpingo oophorectomy and lysis of adhesions Medical         Past Medical History:  Diagnosis Date  . Anxiety   . Arthritis   . Depression                               Surgical Hx:    Past Surgical History:  Procedure Laterality Date  . CESAREAN SECTION    . HERNIA REPAIR     22 yrs old  . LAPAROSCOPIC LYSIS OF ADHESIONS  12/21/2016   Procedure: LAPAROSCOPIC LYSIS OF ADHESIONS;  Surgeon: Tilda Burrow, MD;  Location: [facility];  Service: Gynecology;;  . LAPAROSCOPIC SALPINGO OOPHERECTOMY Left 12/21/2016   Procedure: LAPAROSCOPIC LEFT SALPINGO OOPHORECTOMY;  Surgeon: Tilda Burrow, MD;  Location: [facility];  Service: Gynecology;  Laterality: Left;  . LAPAROSCOPY  12/21/2016   Procedure: LAPAROSCOPY DIAGNOSTIC;  Surgeon: Tilda Burrow, MD;   Location: [facility];  Service: Gynecology;;   Medications: Reviewed & Updated - see associated section                       Current Outpatient Medications:  .  polyethylene glycol powder (MIRALAX) powder, Take 17 g by mouth daily. To prevent constipation (Patient taking differently: Take 17 g by mouth as needed. To prevent constipation), Disp: 255 g, Rfl: prn   Social History: Reviewed -  reports that  has never smoked. she has never used smokeless tobacco.  Objective Findings:  Vitals: Blood pressure 90/70, pulse 75, height 5' (1.524 m), weight 104 lb 9.6 oz (47.4 kg).  Physical Examination: General appearance - alert, well appearing, and in no distress, oriented to person, place, and time and normal appearing weight Mental status - alert, oriented to person, place, and time, normal mood, behavior, speech, dress, motor activity, and thought processes  Pelvic -  VULVA: normal appearing vulva with no masses, tenderness or lesions,  VAGINA: normal appearing vagina with normal color and discharge, no lesions,  CERVIX: normal appearing cervix without discharge or lesions,  UTERUS: uterus is normal size, shape, consistency and some tenderness ADNEXA: normal adnexa in size, nontender and no masses, left ovary surgically removed   Assessment & Plan:  A:  1. Normal GYN exam 2. Dyspareunia secondary to uterine contact 3. Plan contraceptive management  P:  1. Advise pt to use ibuprofen PRN 2.  Low Loestrin 1/20, to take it is a continuous regimen and skip the placebo pills 3. F/u PRN    By signing my name below, I, Izna Ahmed, attest that this documentation has been prepared under the direction and in the presence of Tilda BurrowFerguson, Jahmir Salo V, MD. Electronically Signed: Redge GainerIzna Ahmed, Medical Scribe. 03/11/17. 12:27 PM.  I personally performed the services described in this documentation, which was SCRIBED in my presence. The recorded information has been reviewed and considered accurate. It has  been edited as necessary during review. Tilda BurrowJohn V Yannet Rincon, MD

## 2017-03-14 ENCOUNTER — Telehealth: Payer: Self-pay | Admitting: Obstetrics and Gynecology

## 2017-03-14 NOTE — Telephone Encounter (Signed)
ENTERED IN ERROR

## 2017-04-19 ENCOUNTER — Ambulatory Visit (INDEPENDENT_AMBULATORY_CARE_PROVIDER_SITE_OTHER): Payer: 59 | Admitting: Adult Health

## 2017-04-19 ENCOUNTER — Encounter: Payer: Self-pay | Admitting: Adult Health

## 2017-04-19 VITALS — BP 100/70 | HR 79 | Ht 60.0 in | Wt 108.5 lb

## 2017-04-19 DIAGNOSIS — R102 Pelvic and perineal pain: Secondary | ICD-10-CM | POA: Diagnosis not present

## 2017-04-19 DIAGNOSIS — N926 Irregular menstruation, unspecified: Secondary | ICD-10-CM

## 2017-04-19 DIAGNOSIS — Z3A01 Less than 8 weeks gestation of pregnancy: Secondary | ICD-10-CM

## 2017-04-19 DIAGNOSIS — Z3201 Encounter for pregnancy test, result positive: Secondary | ICD-10-CM | POA: Diagnosis not present

## 2017-04-19 DIAGNOSIS — O3680X0 Pregnancy with inconclusive fetal viability, not applicable or unspecified: Secondary | ICD-10-CM

## 2017-04-19 LAB — POCT URINE PREGNANCY: Preg Test, Ur: POSITIVE — AB

## 2017-04-19 NOTE — Patient Instructions (Signed)
First Trimester of Pregnancy The first trimester of pregnancy is from week 1 until the end of week 13 (months 1 through 3). A week after a sperm fertilizes an egg, the egg will implant on the wall of the uterus. This embryo will begin to develop into a baby. Genes from you and your partner will form the baby. The female genes will determine whether the baby will be a boy or a girl. At 6-8 weeks, the eyes and face will be formed, and the heartbeat can be seen on ultrasound. At the end of 12 weeks, all the baby's organs will be formed. Now that you are pregnant, you will want to do everything you can to have a healthy baby. Two of the most important things are to get good prenatal care and to follow your health care provider's instructions. Prenatal care is all the medical care you receive before the baby's birth. This care will help prevent, find, and treat any problems during the pregnancy and childbirth. Body changes during your first trimester Your body goes through many changes during pregnancy. The changes vary from woman to woman.  You may gain or lose a couple of pounds at first.  You may feel sick to your stomach (nauseous) and you may throw up (vomit). If the vomiting is uncontrollable, call your health care provider.  You may tire easily.  You may develop headaches that can be relieved by medicines. All medicines should be approved by your health care provider.  You may urinate more often. Painful urination may mean you have a bladder infection.  You may develop heartburn as a result of your pregnancy.  You may develop constipation because certain hormones are causing the muscles that push stool through your intestines to slow down.  You may develop hemorrhoids or swollen veins (varicose veins).  Your breasts may begin to grow larger and become tender. Your nipples may stick out more, and the tissue that surrounds them (areola) may become darker.  Your gums may bleed and may be  sensitive to brushing and flossing.  Dark spots or blotches (chloasma, mask of pregnancy) may develop on your face. This will likely fade after the baby is born.  Your menstrual periods will stop.  You may have a loss of appetite.  You may develop cravings for certain kinds of food.  You may have changes in your emotions from day to day, such as being excited to be pregnant or being concerned that something may go wrong with the pregnancy and baby.  You may have more vivid and strange dreams.  You may have changes in your hair. These can include thickening of your hair, rapid growth, and changes in texture. Some women also have hair loss during or after pregnancy, or hair that feels dry or thin. Your hair will most likely return to normal after your baby is born.  What to expect at prenatal visits During a routine prenatal visit:  You will be weighed to make sure you and the baby are growing normally.  Your blood pressure will be taken.  Your abdomen will be measured to track your baby's growth.  The fetal heartbeat will be listened to between weeks 10 and 14 of your pregnancy.  Test results from any previous visits will be discussed.  Your health care provider may ask you:  How you are feeling.  If you are feeling the baby move.  If you have had any abnormal symptoms, such as leaking fluid, bleeding, severe headaches,   or abdominal cramping.  If you are using any tobacco products, including cigarettes, chewing tobacco, and electronic cigarettes.  If you have any questions.  Other tests that may be performed during your first trimester include:  Blood tests to find your blood type and to check for the presence of any previous infections. The tests will also be used to check for low iron levels (anemia) and protein on red blood cells (Rh antibodies). Depending on your risk factors, or if you previously had diabetes during pregnancy, you may have tests to check for high blood  sugar that affects pregnant women (gestational diabetes).  Urine tests to check for infections, diabetes, or protein in the urine.  An ultrasound to confirm the proper growth and development of the baby.  Fetal screens for spinal cord problems (spina bifida) and Down syndrome.  HIV (human immunodeficiency virus) testing. Routine prenatal testing includes screening for HIV, unless you choose not to have this test.  You may need other tests to make sure you and the baby are doing well.  Follow these instructions at home: Medicines  Follow your health care provider's instructions regarding medicine use. Specific medicines may be either safe or unsafe to take during pregnancy.  Take a prenatal vitamin that contains at least 600 micrograms (mcg) of folic acid.  If you develop constipation, try taking a stool softener if your health care provider approves. Eating and drinking  Eat a balanced diet that includes fresh fruits and vegetables, whole grains, good sources of protein such as meat, eggs, or tofu, and low-fat dairy. Your health care provider will help you determine the amount of weight gain that is right for you.  Avoid raw meat and uncooked cheese. These carry germs that can cause birth defects in the baby.  Eating four or five small meals rather than three large meals a day may help relieve nausea and vomiting. If you start to feel nauseous, eating a few soda crackers can be helpful. Drinking liquids between meals, instead of during meals, also seems to help ease nausea and vomiting.  Limit foods that are high in fat and processed sugars, such as fried and sweet foods.  To prevent constipation: ? Eat foods that are high in fiber, such as fresh fruits and vegetables, whole grains, and beans. ? Drink enough fluid to keep your urine clear or pale yellow. Activity  Exercise only as directed by your health care provider. Most women can continue their usual exercise routine during  pregnancy. Try to exercise for 30 minutes at least 5 days a week. Exercising will help you: ? Control your weight. ? Stay in shape. ? Be prepared for labor and delivery.  Experiencing pain or cramping in the lower abdomen or lower back is a good sign that you should stop exercising. Check with your health care provider before continuing with normal exercises.  Try to avoid standing for long periods of time. Move your legs often if you must stand in one place for a long time.  Avoid heavy lifting.  Wear low-heeled shoes and practice good posture.  You may continue to have sex unless your health care provider tells you not to. Relieving pain and discomfort  Wear a good support bra to relieve breast tenderness.  Take warm sitz baths to soothe any pain or discomfort caused by hemorrhoids. Use hemorrhoid cream if your health care provider approves.  Rest with your legs elevated if you have leg cramps or low back pain.  If you develop   varicose veins in your legs, wear support hose. Elevate your feet for 15 minutes, 3-4 times a day. Limit salt in your diet. Prenatal care  Schedule your prenatal visits by the twelfth week of pregnancy. They are usually scheduled monthly at first, then more often in the last 2 months before delivery.  Write down your questions. Take them to your prenatal visits.  Keep all your prenatal visits as told by your health care provider. This is important. Safety  Wear your seat belt at all times when driving.  Make a list of emergency phone numbers, including numbers for family, friends, the hospital, and police and fire departments. General instructions  Ask your health care provider for a referral to a local prenatal education class. Begin classes no later than the beginning of month 6 of your pregnancy.  Ask for help if you have counseling or nutritional needs during pregnancy. Your health care provider can offer advice or refer you to specialists for help  with various needs.  Do not use hot tubs, steam rooms, or saunas.  Do not douche or use tampons or scented sanitary pads.  Do not cross your legs for long periods of time.  Avoid cat litter boxes and soil used by cats. These carry germs that can cause birth defects in the baby and possibly loss of the fetus by miscarriage or stillbirth.  Avoid all smoking, herbs, alcohol, and medicines not prescribed by your health care provider. Chemicals in these products affect the formation and growth of the baby.  Do not use any products that contain nicotine or tobacco, such as cigarettes and e-cigarettes. If you need help quitting, ask your health care provider. You may receive counseling support and other resources to help you quit.  Schedule a dentist appointment. At home, brush your teeth with a soft toothbrush and be gentle when you floss. Contact a health care provider if:  You have dizziness.  You have mild pelvic cramps, pelvic pressure, or nagging pain in the abdominal area.  You have persistent nausea, vomiting, or diarrhea.  You have a bad smelling vaginal discharge.  You have pain when you urinate.  You notice increased swelling in your face, hands, legs, or ankles.  You are exposed to fifth disease or chickenpox.  You are exposed to [place] measles (rubella) and have never had it. Get help right away if:  You have a fever.  You are leaking fluid from your vagina.  You have spotting or bleeding from your vagina.  You have severe abdominal cramping or pain.  You have rapid weight gain or loss.  You vomit blood or material that looks like coffee grounds.  You develop a severe headache.  You have shortness of breath.  You have any kind of trauma, such as from a fall or a car accident. Summary  The first trimester of pregnancy is from week 1 until the end of week 13 (months 1 through 3).  Your body goes through many changes during pregnancy. The changes vary from  woman to woman.  You will have routine prenatal visits. During those visits, your health care provider will examine you, discuss any test results you may have, and talk with you about how you are feeling. This information is not intended to replace advice given to you by your health care provider. Make sure you discuss any questions you have with your health care provider. Document Released: 01/26/2001 Document Revised: 01/14/2016 Document Reviewed: 01/14/2016 Elsevier Interactive Patient Education  2018 [employer]  

## 2017-04-19 NOTE — Progress Notes (Signed)
Subjective:     Patient ID: Jessica Poole, female   DOB: 10/15/1995, 22 y.o.   MRN: [id number]  HPI Jessica Poole is a 22 year old white female in for UPT, she has missed a period and had 3+ HPTs, she had lap. LSO and LOA in November. She is prior C-section but desires VBAC.  Review of Systems +missed period with 3+HPTs Mild cramps, no bleeding  Reviewed past medical,surgical, social and family history. Reviewed medications and allergies.     Objective:   Physical Exam BP 100/70 (BP Location: Left Arm, Patient Position: Sitting, Cuff Size: Normal)   Pulse 79   Ht 5' (1.524 m)   Wt 108 lb 8 oz (49.2 kg)   LMP 03/14/2017   BMI 21.19 kg/m UPT +, about 5+1 week by LMP, with EDD 12/19/17.Skin warm and dry. Neck: mid line trachea, normal thyroid, good ROM, no lymphadenopathy noted. Lungs: clear to ausculation bilaterally. Cardiovascular: regular rate and rhythm.Abdomen is soft and non tender.Has numerous piercing's and tattoos.    Assessment:     1. Pregnancy examination or test, positive result   2. Less than [redacted] weeks gestation of pregnancy   3. Encounter to determine fetal viability of pregnancy, single or unspecified fetus       Plan:     Continue PNV Return in 2 weeks for dating US Review handouts on First trimester and by [employer] often  [place]

## 2017-05-03 ENCOUNTER — Ambulatory Visit (INDEPENDENT_AMBULATORY_CARE_PROVIDER_SITE_OTHER): Payer: 59

## 2017-05-03 DIAGNOSIS — Z3A01 Less than 8 weeks gestation of pregnancy: Secondary | ICD-10-CM

## 2017-05-03 DIAGNOSIS — O3680X0 Pregnancy with inconclusive fetal viability, not applicable or unspecified: Secondary | ICD-10-CM

## 2017-05-03 NOTE — Progress Notes (Signed)
US 7+1 wks,single IUP w/ys,positive fht 147 bpm,normal right ovary,left oophorectomy,left adnexa wnl,crl 10.1 mm,EDD 12/19/2017

## 2017-05-23 ENCOUNTER — Ambulatory Visit: Payer: 59 | Admitting: *Deleted

## 2017-05-23 ENCOUNTER — Encounter: Payer: Self-pay | Admitting: Women's Health

## 2017-05-23 ENCOUNTER — Ambulatory Visit (INDEPENDENT_AMBULATORY_CARE_PROVIDER_SITE_OTHER): Payer: 59 | Admitting: Women's Health

## 2017-05-23 VITALS — BP 98/68 | HR 76 | Wt 102.0 lb

## 2017-05-23 DIAGNOSIS — O34219 Maternal care for unspecified type scar from previous cesarean delivery: Secondary | ICD-10-CM | POA: Diagnosis not present

## 2017-05-23 DIAGNOSIS — Z3A10 10 weeks gestation of pregnancy: Secondary | ICD-10-CM

## 2017-05-23 DIAGNOSIS — Z3481 Encounter for supervision of other normal pregnancy, first trimester: Secondary | ICD-10-CM

## 2017-05-23 DIAGNOSIS — O219 Vomiting of pregnancy, unspecified: Secondary | ICD-10-CM

## 2017-05-23 DIAGNOSIS — Z331 Pregnant state, incidental: Secondary | ICD-10-CM

## 2017-05-23 DIAGNOSIS — Z1389 Encounter for screening for other disorder: Secondary | ICD-10-CM

## 2017-05-23 DIAGNOSIS — O0990 Supervision of high risk pregnancy, unspecified, unspecified trimester: Secondary | ICD-10-CM | POA: Insufficient documentation

## 2017-05-23 LAB — POCT URINALYSIS DIPSTICK
GLUCOSE UA: NEGATIVE
Ketones, UA: NEGATIVE
Leukocytes, UA: NEGATIVE
Nitrite, UA: NEGATIVE
Protein, UA: NEGATIVE
RBC UA: NEGATIVE

## 2017-05-23 MED ORDER — DOXYLAMINE-PYRIDOXINE ER 20-20 MG PO TBCR: 1.0000 | EXTENDED_RELEASE_TABLET | Freq: Every day | ORAL | 8 refills | Status: DC

## 2017-05-23 NOTE — Patient Instructions (Signed)
Jessica ClontsSamantha B Dicker, I greatly value your feedback.  If you receive a survey following your visit with us today, we appreciate you taking the time to fill it out.  Thanks, Joellyn HaffKim Daylani Deblois, CNM, WHNP-BC   Nausea & Vomiting  Have saltine crackers or pretzels by your bed and eat a few bites before you raise your head out of bed in the morning  Eat small frequent meals throughout the day instead of large meals  Drink plenty of fluids throughout the day to stay hydrated, just don't drink a lot of fluids with your meals.  This can make your stomach fill up faster making you feel sick  Do not brush your teeth right after you eat  Products with real ginger are good for nausea, like ginger ale and ginger hard candy Make sure it says made with real ginger!  Sucking on sour candy like lemon heads is also good for nausea  If your prenatal vitamins make you nauseated, take them at night so you will sleep through the nausea  Sea Bands  If you feel like you need medicine for the nausea & vomiting please let us know  If you are unable to keep any fluids or food down please let us know   Constipation  Drink plenty of fluid, preferably water, throughout the day  Eat foods high in fiber such as fruits, vegetables, and grains  Exercise, such as walking, is a good way to keep your bowels regular  Drink warm fluids, especially warm prune juice, or decaf coffee  Eat a 1/2 cup of real oatmeal (not instant), 1/2 cup applesauce, and 1/2-1 cup warm prune juice every day  If needed, you may take Colace (docusate sodium) stool softener once or twice a day to help keep the stool soft. If you are pregnant, wait until you are out of your first trimester (12-14 weeks of pregnancy)  If you still are having problems with constipation, you may take Miralax once daily as needed to help keep your bowels regular.  If you are pregnant, wait until you are out of your first trimester (12-14 weeks of pregnancy)   First  Trimester of Pregnancy The first trimester of pregnancy is from week 1 until the end of week 12 (months 1 through 3). A week after a sperm fertilizes an egg, the egg will implant on the wall of the uterus. This embryo will begin to develop into a baby. Genes from you and your partner are forming the baby. The female genes determine whether the baby is a boy or a girl. At 6-8 weeks, the eyes and face are formed, and the heartbeat can be seen on ultrasound. At the end of 12 weeks, all the baby's organs are formed.  Now that you are pregnant, you will want to do everything you can to have a healthy baby. Two of the most important things are to get good prenatal care and to follow your health care provider's instructions. Prenatal care is all the medical care you receive before the baby's birth. This care will help prevent, find, and treat any problems during the pregnancy and childbirth. BODY CHANGES Your body goes through many changes during pregnancy. The changes vary from woman to woman.   You may gain or lose a couple of pounds at first.  You may feel sick to your stomach (nauseous) and throw up (vomit). If the vomiting is uncontrollable, call your health care provider.  You may tire easily.  You may develop headaches  that can be relieved by medicines approved by your health care provider.  You may urinate more often. Painful urination may mean you have a bladder infection.  You may develop heartburn as a result of your pregnancy.  You may develop constipation because certain hormones are causing the muscles that push waste through your intestines to slow down.  You may develop hemorrhoids or swollen, bulging veins (varicose veins).  Your breasts may begin to grow larger and become tender. Your nipples may stick out more, and the tissue that surrounds them (areola) may become darker.  Your gums may bleed and may be sensitive to brushing and flossing.  Dark spots or blotches (chloasma, mask  of pregnancy) may develop on your face. This will likely fade after the baby is born.  Your menstrual periods will stop.  You may have a loss of appetite.  You may develop cravings for certain kinds of food.  You may have changes in your emotions from day to day, such as being excited to be pregnant or being concerned that something may go wrong with the pregnancy and baby.  You may have more vivid and strange dreams.  You may have changes in your hair. These can include thickening of your hair, rapid growth, and changes in texture. Some women also have hair loss during or after pregnancy, or hair that feels dry or thin. Your hair will most likely return to normal after your baby is born. WHAT TO EXPECT AT YOUR PRENATAL VISITS During a routine prenatal visit:  You will be weighed to make sure you and the baby are growing normally.  Your blood pressure will be taken.  Your abdomen will be measured to track your baby's growth.  The fetal heartbeat will be listened to starting around week 10 or 12 of your pregnancy.  Test results from any previous visits will be discussed. Your health care provider may ask you:  How you are feeling.  If you are feeling the baby move.  If you have had any abnormal symptoms, such as leaking fluid, bleeding, severe headaches, or abdominal cramping.  If you have any questions. Other tests that may be performed during your first trimester include:  Blood tests to find your blood type and to check for the presence of any previous infections. They will also be used to check for low iron levels (anemia) and Rh antibodies. Later in the pregnancy, blood tests for diabetes will be done along with other tests if problems develop.  Urine tests to check for infections, diabetes, or protein in the urine.  An ultrasound to confirm the proper growth and development of the baby.  An amniocentesis to check for possible genetic problems.  Fetal screens for spina  bifida and Down syndrome.  You may need other tests to make sure you and the baby are doing well. HOME CARE INSTRUCTIONS  Medicines  Follow your health care provider's instructions regarding medicine use. Specific medicines may be either safe or unsafe to take during pregnancy.  Take your prenatal vitamins as directed.  If you develop constipation, try taking a stool softener if your health care provider approves. Diet  Eat regular, well-balanced meals. Choose a variety of foods, such as meat or vegetable-based protein, fish, milk and low-fat dairy products, vegetables, fruits, and whole grain breads and cereals. Your health care provider will help you determine the amount of weight gain that is right for you.  Avoid raw meat and uncooked cheese. These carry germs that can  cause birth defects in the baby.  Eating four or five small meals rather than three large meals a day may help relieve nausea and vomiting. If you start to feel nauseous, eating a few soda crackers can be helpful. Drinking liquids between meals instead of during meals also seems to help nausea and vomiting.  If you develop constipation, eat more high-fiber foods, such as fresh vegetables or fruit and whole grains. Drink enough fluids to keep your urine clear or pale yellow. Activity and Exercise  Exercise only as directed by your health care provider. Exercising will help you:  Control your weight.  Stay in shape.  Be prepared for labor and delivery.  Experiencing pain or cramping in the lower abdomen or low back is a good sign that you should stop exercising. Check with your health care provider before continuing normal exercises.  Try to avoid standing for long periods of time. Move your legs often if you must stand in one place for a long time.  Avoid heavy lifting.  Wear low-heeled shoes, and practice good posture.  You may continue to have sex unless your health care provider directs you  otherwise. Relief of Pain or Discomfort  Wear a good support bra for breast tenderness.   Take warm sitz baths to soothe any pain or discomfort caused by hemorrhoids. Use hemorrhoid cream if your health care provider approves.   Rest with your legs elevated if you have leg cramps or low back pain.  If you develop varicose veins in your legs, wear support hose. Elevate your feet for 15 minutes, 3-4 times a day. Limit salt in your diet. Prenatal Care  Schedule your prenatal visits by the twelfth week of pregnancy. They are usually scheduled monthly at first, then more often in the last 2 months before delivery.  Write down your questions. Take them to your prenatal visits.  Keep all your prenatal visits as directed by your health care provider. Safety  Wear your seat belt at all times when driving.  Make a list of emergency phone numbers, including numbers for family, friends, the hospital, and police and fire departments. General Tips  Ask your health care provider for a referral to a local prenatal education class. Begin classes no later than at the beginning of month 6 of your pregnancy.  Ask for help if you have counseling or nutritional needs during pregnancy. Your health care provider can offer advice or refer you to specialists for help with various needs.  Do not use hot tubs, steam rooms, or saunas.  Do not douche or use tampons or scented sanitary pads.  Do not cross your legs for long periods of time.  Avoid cat litter boxes and soil used by cats. These carry germs that can cause birth defects in the baby and possibly loss of the fetus by miscarriage or stillbirth.  Avoid all smoking, herbs, alcohol, and medicines not prescribed by your health care provider. Chemicals in these affect the formation and growth of the baby.  Schedule a dentist appointment. At home, brush your teeth with a soft toothbrush and be gentle when you floss. SEEK MEDICAL CARE IF:   You have  dizziness.  You have mild pelvic cramps, pelvic pressure, or nagging pain in the abdominal area.  You have persistent nausea, vomiting, or diarrhea.  You have a bad smelling vaginal discharge.  You have pain with urination.  You notice increased swelling in your face, hands, legs, or ankles. SEEK IMMEDIATE MEDICAL CARE IF:  You have a fever.  You are leaking fluid from your vagina.  You have spotting or bleeding from your vagina.  You have severe abdominal cramping or pain.  You have rapid weight gain or loss.  You vomit blood or material that looks like coffee grounds.  You are exposed to [place] measles and have never had them.  You are exposed to fifth disease or chickenpox.  You develop a severe headache.  You have shortness of breath.  You have any kind of trauma, such as from a fall or a car accident. Document Released: 01/26/2001 Document Revised: 06/18/2013 Document Reviewed: 12/12/2012 [facility] Patient Information 2015 [place], [place]. This information is not intended to replace advice given to you by your health care provider. Make sure you discuss any questions you have with your health care provider.

## 2017-05-23 NOTE — Progress Notes (Signed)
INITIAL OBSTETRICAL VISIT Patient name: Jessica Poole MRN [id number]  Date of birth: 03/06/1995 Chief Complaint:   Initial Prenatal Visit (nausea)  History of Present Illness:   SERGIO HOBART is a 22 y.o. G7P1001 Caucasian female at [redacted]w[redacted]d by LMP c/w 7wk u/s, with an Estimated Date of Delivery: 12/19/17 being seen today for her initial obstetrical visit.   Her obstetrical history is significant for oligo at term>IOL>C/S for FTP @ 4cm. Wants TOLAC.   Today she reports no complaints and n/v- requests medicine.  Patient's last menstrual period was 03/14/2017. Last pap 2018 at [facility]. Results were: normal Review of Systems:   Pertinent items are noted in HPI Denies cramping/contractions, leakage of fluid, vaginal bleeding, abnormal vaginal discharge w/ itching/odor/irritation, headaches, visual changes, shortness of breath, chest pain, abdominal pain, severe nausea/vomiting, or problems with urination or bowel movements unless otherwise stated above.  Pertinent History Reviewed:  Reviewed past medical,surgical, social, obstetrical and family history.  Reviewed problem list, medications and allergies. OB History  Gravida Para Term Preterm AB Living  2 1 1  0 0 1  SAB TAB Ectopic Multiple Live Births          1    # Outcome Date GA Lbr Len/2nd Weight Sex Delivery Anes PTL Lv  2 Current           1 Term 11/06/14 [redacted]w[redacted]d  7 lb 13 oz (3.544 kg) M CS-LTranv EPI N LIV     Complications: Oligohydramnios   Physical Assessment:   Vitals:   05/23/17 1529  Weight: 102 lb (46.3 kg)  Body mass index is 19.92 kg/m.       Physical Examination:  General appearance - well appearing, and in no distress  Mental status - alert, oriented to person, place, and time  Psych:  She has a normal mood and affect  Skin - warm and dry, normal color, no suspicious lesions noted  Chest - effort normal, all lung fields clear to auscultation bilaterally  Heart - normal rate and regular  rhythm  Abdomen - soft, nontender  Extremities:  No swelling or varicosities noted  Thin prep pap is not done  Fetal Heart Rate (bpm): +u/s via informal transabdominal u/s  No results found for this or any previous visit (from the past 24 hour(s)).  Assessment & Plan:  1) Low-Risk Pregnancy G2P1001 at [redacted]w[redacted]d with an Estimated Date of Delivery: 12/19/17   2) Initial OB visit  3) N/V of pregnancy> rx bonjesta to [facility], printed tips given  4)  Prev c/s> for FTP at 4cm after IOL for oligo, request records, wants TOLAC, consent given to take home and review  Meds:  Meds ordered this encounter  Medications  . Doxylamine-Pyridoxine ER (BONJESTA) 20-20 MG TBCR    Sig: Take 1 tablet by mouth at bedtime. Can add 1 tablet in the morning if needed for nausea and vomiting    Dispense:  60 tablet    Refill:  8    Order Specific Question:   Supervising Provider    Answer:   Lazaro Arms [[id number]]    Initial labs obtained Continue prenatal vitamins Reviewed n/v relief measures and warning s/s to report Reviewed recommended weight gain based on pre-gravid BMI Encouraged well-balanced diet Genetic Screening discussed Integrated Screen: declined Cystic fibrosis screening discussed declined Ultrasound discussed; fetal survey: requested CCNC completed> not applying for preg mcaid  Follow-up: Return in about 1 month (around 06/20/2017) for LROB.   Orders Placed This Encounter  Procedures  . GC/Chlamydia Probe Amp  . Urine Culture  . Obstetric Panel, Including HIV  . Urinalysis, Routine w reflex microscopic  . Pain Management Screening Profile (10S)  . POCT urinalysis dipstick    Cheral MarkerKimberly R Janis Cuffe CNM, [facility]NP[facility] 05/23/2017 4:55 PM

## 2017-05-24 ENCOUNTER — Encounter: Payer: Self-pay | Admitting: Women's Health

## 2017-05-24 LAB — OBSTETRIC PANEL, INCLUDING HIV
ANTIBODY SCREEN: NEGATIVE
BASOS ABS: 0 10*3/uL (ref 0.0–0.2)
Basos: 0 %
EOS (ABSOLUTE): 0.1 10*3/uL (ref 0.0–0.4)
Eos: 1 %
HIV SCREEN 4TH GENERATION: NONREACTIVE
Hematocrit: 41.3 % (ref 34.0–46.6)
Hemoglobin: 13.8 g/dL (ref 11.1–15.9)
Hepatitis B Surface Ag: NEGATIVE
Immature Grans (Abs): 0 10*3/uL (ref 0.0–0.1)
Immature Granulocytes: 0 %
LYMPHS ABS: 1.5 10*3/uL (ref 0.7–3.1)
Lymphs: 14 %
MCH: 31.9 pg (ref 26.6–33.0)
MCHC: 33.4 g/dL (ref 31.5–35.7)
MCV: 96 fL (ref 79–97)
MONOCYTES: 6 %
MONOS ABS: 0.6 10*3/uL (ref 0.1–0.9)
NEUTROS ABS: 8.2 10*3/uL — AB (ref 1.4–7.0)
Neutrophils: 79 %
PLATELETS: 241 10*3/uL (ref 150–379)
RBC: 4.32 x10E6/uL (ref 3.77–5.28)
RDW: 13.4 % (ref 12.3–15.4)
RPR Ser Ql: NONREACTIVE
RUBELLA: 1.88 {index} (ref >0.99)
Rh Factor: POSITIVE
WBC: 10.4 10*3/uL (ref 3.4–10.8)

## 2017-05-24 LAB — PMP SCREEN PROFILE (10S), URINE
AMPHETAMINE SCREEN URINE: NEGATIVE ng/mL
BARBITURATE SCREEN URINE: NEGATIVE ng/mL
BENZODIAZEPINE SCREEN, URINE: NEGATIVE ng/mL
CANNABINOIDS UR QL SCN: NEGATIVE ng/mL
COCAINE(METAB.)SCREEN, URINE: NEGATIVE ng/mL
CREATININE(CRT), U: 146.8 mg/dL (ref 20.0–300.0)
METHADONE SCREEN, URINE: NEGATIVE ng/mL
OXYCODONE+OXYMORPHONE UR QL SCN: NEGATIVE ng/mL
Opiate Scrn, Ur: NEGATIVE ng/mL
PH UR, DRUG SCRN: 5.6 (ref 4.5–8.9)
PHENCYCLIDINE QUANTITATIVE URINE: NEGATIVE ng/mL
PROPOXYPHENE SCREEN URINE: NEGATIVE ng/mL

## 2017-05-24 LAB — URINALYSIS, ROUTINE W REFLEX MICROSCOPIC
BILIRUBIN UA: NEGATIVE
Glucose, UA: NEGATIVE
Ketones, UA: NEGATIVE
Leukocytes, UA: NEGATIVE
Nitrite, UA: NEGATIVE
Protein, UA: NEGATIVE
RBC, UA: NEGATIVE
SPEC GRAV UA: 1.024 (ref 1.005–1.030)
Urobilinogen, Ur: 0.2 mg/dL (ref 0.2–1.0)
pH, UA: 5.5 (ref 5.0–7.5)

## 2017-05-24 LAB — MED LIST OPTION NOT SELECTED

## 2017-05-25 LAB — GC/CHLAMYDIA PROBE AMP
CHLAMYDIA, DNA PROBE: NEGATIVE
NEISSERIA GONORRHOEAE BY PCR: NEGATIVE

## 2017-05-25 LAB — URINE CULTURE

## 2017-06-20 ENCOUNTER — Encounter: Payer: Self-pay | Admitting: Women's Health

## 2017-06-20 ENCOUNTER — Ambulatory Visit (INDEPENDENT_AMBULATORY_CARE_PROVIDER_SITE_OTHER): Payer: 59 | Admitting: Women's Health

## 2017-06-20 VITALS — BP 90/64 | HR 97 | Wt 105.5 lb

## 2017-06-20 DIAGNOSIS — Z3482 Encounter for supervision of other normal pregnancy, second trimester: Secondary | ICD-10-CM

## 2017-06-20 DIAGNOSIS — Z363 Encounter for antenatal screening for malformations: Secondary | ICD-10-CM

## 2017-06-20 DIAGNOSIS — O26851 Spotting complicating pregnancy, first trimester: Secondary | ICD-10-CM

## 2017-06-20 DIAGNOSIS — Z1389 Encounter for screening for other disorder: Secondary | ICD-10-CM

## 2017-06-20 DIAGNOSIS — Z3A14 14 weeks gestation of pregnancy: Secondary | ICD-10-CM

## 2017-06-20 DIAGNOSIS — Z331 Pregnant state, incidental: Secondary | ICD-10-CM

## 2017-06-20 LAB — POCT URINALYSIS DIPSTICK
Blood, UA: NEGATIVE
GLUCOSE UA: NEGATIVE
Ketones, UA: NEGATIVE
LEUKOCYTES UA: NEGATIVE
NITRITE UA: NEGATIVE
PROTEIN UA: NEGATIVE

## 2017-06-20 NOTE — Progress Notes (Signed)
   LOW-RISK PREGNANCY VISIT Patient name: Jessica Poole MRN [id number]  Date of birth: Jun 13, 1995 Chief Complaint:   Routine Prenatal Visit  History of Present Illness:   Jessica Poole is a 22 y.o. G70P1001 female at [redacted]w[redacted]d with an Estimated Date of Delivery: 12/19/17 being seen today for ongoing management of a low-risk pregnancy.  Today she reports spotting after sex, small amt. Denies abnormal discharge, itching/odor/irritation. Went to [place] at 12wks, had blood work and [employer] and was told it was a boy.   . Vag. Bleeding: Small.   . denies leaking of fluid. Review of Systems:   Pertinent items are noted in HPI Denies abnormal vaginal discharge w/ itching/odor/irritation, headaches, visual changes, shortness of breath, chest pain, abdominal pain, severe nausea/vomiting, or problems with urination or bowel movements unless otherwise stated above. Pertinent History Reviewed:  Reviewed past medical,surgical, social, obstetrical and family history.  Reviewed problem list, medications and allergies. Physical Assessment:   Vitals:   06/20/17 1537  BP: 90/64  Pulse: 97  Weight: 105 lb 8 oz (47.9 kg)  Body mass index is 20.6 kg/m.        Physical Examination:   General appearance: Well appearing, and in no distress  Mental status: Alert, oriented to person, place, and time  Skin: Warm & dry  Cardiovascular: Normal heart rate noted  Respiratory: Normal respiratory effort, no distress  Abdomen: Soft, gravid, nontender  Pelvic: Cervical exam deferred         Extremities: Edema: Trace  Fetal Status:          Results for orders placed or performed in visit on 06/20/17 (from the past 24 hour(s))  POCT urinalysis dipstick   Collection Time: 06/20/17  3:38 PM  Result Value Ref Range   Color, UA     Clarity, UA     Glucose, UA neg    Bilirubin, UA     Ketones, UA neg    Spec Grav, UA  1.010 - 1.025   Blood, UA neg    pH, UA  5.0 - 8.0   Protein, UA neg    Urobilinogen, UA   0.2 or 1.0 E.U./dL   Nitrite, UA neg    Leukocytes, UA Negative Negative   Appearance     Odor      Assessment & Plan:  1) Low-risk pregnancy G2P1001 at [redacted]w[redacted]d with an Estimated Date of Delivery: 12/19/17   2) Spotting after sex, discussed normal during pregnancy, if develops sx of infection let us know   Meds: No orders of the defined types were placed in this encounter.  Labs/procedures today:   Plan:  Continue routine obstetrical care   Reviewed: Preterm labor symptoms and general obstetric precautions including but not limited to vaginal bleeding, contractions, leaking of fluid and fetal movement were reviewed in detail with the patient.  All questions were answered  Follow-up: Return in about 1 month (around 07/18/2017) for LROB, [id number].  Orders Placed This Encounter  Procedures  . US OB Comp + 14 Wk  . POCT urinalysis dipstick   Cheral Marker CNM, [facility] 06/20/2017 4:00 PM

## 2017-06-20 NOTE — Patient Instructions (Signed)
Jessica Poole, I greatly value your feedback.  If you receive a survey following your visit with [place] today, we appreciate you taking the time to fill it out.  Thanks, [name], CNM, WHNP-BC   Second Trimester of Pregnancy The second trimester is from week 14 through week 27 (months 4 through 6). The second trimester is often a time when you feel your best. Your body has adjusted to being pregnant, and you begin to feel better physically. Usually, morning sickness has lessened or quit completely, you may have more energy, and you may have an increase in appetite. The second trimester is also a time when the fetus is growing rapidly. At the end of the sixth month, the fetus is about 9 inches long and weighs about 1 pounds. You will likely begin to feel the baby move (quickening) between 16 and 20 weeks of pregnancy. Body changes during your second trimester Your body continues to go through many changes during your second trimester. The changes vary from woman to woman.  Your weight will continue to increase. You will notice your lower abdomen bulging out.  You may begin to get stretch marks on your hips, abdomen, and breasts.  You may develop headaches that can be relieved by medicines. The medicines should be approved by your health care provider.  You may urinate more often because the fetus is pressing on your bladder.  You may develop or continue to have heartburn as a result of your pregnancy.  You may develop constipation because certain hormones are causing the muscles that push waste through your intestines to slow down.  You may develop hemorrhoids or swollen, bulging veins (varicose veins).  You may have back pain. This is caused by: ? Weight gain. ? Pregnancy hormones that are relaxing the joints in your pelvis. ? A shift in weight and the muscles that support your balance.  Your breasts will continue to grow and they will continue to become tender.  Your gums may  bleed and may be sensitive to brushing and flossing.  Dark spots or blotches (chloasma, mask of pregnancy) may develop on your face. This will likely fade after the baby is born.  A dark line from your belly button to the pubic area (linea nigra) may appear. This will likely fade after the baby is born.  You may have changes in your hair. These can include thickening of your hair, rapid growth, and changes in texture. Some women also have hair loss during or after pregnancy, or hair that feels dry or thin. Your hair will most likely return to normal after your baby is born.  What to expect at prenatal visits During a routine prenatal visit:  You will be weighed to make sure you and the fetus are growing normally.  Your blood pressure will be taken.  Your abdomen will be measured to track your baby's growth.  The fetal heartbeat will be listened to.  Any test results from the previous visit will be discussed.  Your health care provider may ask you:  How you are feeling.  If you are feeling the baby move.  If you have had any abnormal symptoms, such as leaking fluid, bleeding, severe headaches, or abdominal cramping.  If you are using any tobacco products, including cigarettes, chewing tobacco, and electronic cigarettes.  If you have any questions.  Other tests that may be performed during your second trimester include:  Blood tests that check for: ? Low iron levels (anemia). ? High  blood sugar that affects pregnant women (gestational diabetes) between 42 and 28 weeks. ? Rh antibodies. This is to check for a protein on red blood cells (Rh factor).  Urine tests to check for infections, diabetes, or protein in the urine.  An ultrasound to confirm the proper growth and development of the baby.  An amniocentesis to check for possible genetic problems.  Fetal screens for spina bifida and Down syndrome.  HIV (human immunodeficiency virus) testing. Routine prenatal testing  includes screening for HIV, unless you choose not to have this test.  Follow these instructions at home: Medicines  Follow your health care provider's instructions regarding medicine use. Specific medicines may be either safe or unsafe to take during pregnancy.  Take a prenatal vitamin that contains at least 600 micrograms (mcg) of folic acid.  If you develop constipation, try taking a stool softener if your health care provider approves. Eating and drinking  Eat a balanced diet that includes fresh fruits and vegetables, whole grains, good sources of protein such as meat, eggs, or tofu, and low-fat dairy. Your health care provider will help you determine the amount of weight gain that is right for you.  Avoid raw meat and uncooked cheese. These carry germs that can cause birth defects in the baby.  If you have low calcium intake from food, talk to your health care provider about whether you should take a daily calcium supplement.  Limit foods that are high in fat and processed sugars, such as fried and sweet foods.  To prevent constipation: ? Drink enough fluid to keep your urine clear or pale yellow. ? Eat foods that are high in fiber, such as fresh fruits and vegetables, whole grains, and beans. Activity  Exercise only as directed by your health care provider. Most women can continue their usual exercise routine during pregnancy. Try to exercise for 30 minutes at least 5 days a week. Stop exercising if you experience uterine contractions.  Avoid heavy lifting, wear low heel shoes, and practice good posture.  A sexual relationship may be continued unless your health care provider directs you otherwise. Relieving pain and discomfort  Wear a good support bra to prevent discomfort from breast tenderness.  Take warm sitz baths to soothe any pain or discomfort caused by hemorrhoids. Use hemorrhoid cream if your health care provider approves.  Rest with your legs elevated if you have  leg cramps or low back pain.  If you develop varicose veins, wear support hose. Elevate your feet for 15 minutes, 3-4 times a day. Limit salt in your diet. Prenatal Care  Write down your questions. Take them to your prenatal visits.  Keep all your prenatal visits as told by your health care provider. This is important. Safety  Wear your seat belt at all times when driving.  Make a list of emergency phone numbers, including numbers for family, friends, the hospital, and police and fire departments. General instructions  Ask your health care provider for a referral to a local prenatal education class. Begin classes no later than the beginning of month 6 of your pregnancy.  Ask for help if you have counseling or nutritional needs during pregnancy. Your health care provider can offer advice or refer you to specialists for help with various needs.  Do not use hot tubs, steam rooms, or saunas.  Do not douche or use tampons or scented sanitary pads.  Do not cross your legs for long periods of time.  Avoid cat litter boxes and soil  used by cats. These carry germs that can cause birth defects in the baby and possibly loss of the fetus by miscarriage or stillbirth.  Avoid all smoking, herbs, alcohol, and unprescribed drugs. Chemicals in these products can affect the formation and growth of the baby.  Do not use any products that contain nicotine or tobacco, such as cigarettes and e-cigarettes. If you need help quitting, ask your health care provider.  Visit your dentist if you have not gone yet during your pregnancy. Use a soft toothbrush to brush your teeth and be gentle when you floss. Contact a health care provider if:  You have dizziness.  You have mild pelvic cramps, pelvic pressure, or nagging pain in the abdominal area.  You have persistent nausea, vomiting, or diarrhea.  You have a bad smelling vaginal discharge.  You have pain when you urinate. Get help right away if:  You  have a fever.  You are leaking fluid from your vagina.  You have spotting or bleeding from your vagina.  You have severe abdominal cramping or pain.  You have rapid weight gain or weight loss.  You have shortness of breath with chest pain.  You notice sudden or extreme swelling of your face, hands, ankles, feet, or legs.  You have not felt your baby move in over an hour.  You have severe headaches that do not go away when you take medicine.  You have vision changes. Summary  The second trimester is from week 14 through week 27 (months 4 through 6). It is also a time when the fetus is growing rapidly.  Your body goes through many changes during pregnancy. The changes vary from woman to woman.  Avoid all smoking, herbs, alcohol, and unprescribed drugs. These chemicals affect the formation and growth your baby.  Do not use any tobacco products, such as cigarettes, chewing tobacco, and e-cigarettes. If you need help quitting, ask your health care provider.  Contact your health care provider if you have any questions. Keep all prenatal visits as told by your health care provider. This is important. This information is not intended to replace advice given to you by your health care provider. Make sure you discuss any questions you have with your health care provider. Document Released: 01/26/2001 Document Revised: 07/10/2015 Document Reviewed: 04/04/2012 Elsevier Interactive Patient Education  2017 [employer].

## 2017-07-18 ENCOUNTER — Encounter: Payer: Self-pay | Admitting: Obstetrics & Gynecology

## 2017-07-18 ENCOUNTER — Ambulatory Visit (INDEPENDENT_AMBULATORY_CARE_PROVIDER_SITE_OTHER): Payer: 59

## 2017-07-18 ENCOUNTER — Ambulatory Visit (INDEPENDENT_AMBULATORY_CARE_PROVIDER_SITE_OTHER): Payer: 59 | Admitting: Obstetrics & Gynecology

## 2017-07-18 VITALS — BP 88/60 | HR 86 | Wt 108.5 lb

## 2017-07-18 DIAGNOSIS — O34219 Maternal care for unspecified type scar from previous cesarean delivery: Secondary | ICD-10-CM

## 2017-07-18 DIAGNOSIS — Z3482 Encounter for supervision of other normal pregnancy, second trimester: Secondary | ICD-10-CM

## 2017-07-18 DIAGNOSIS — Z331 Pregnant state, incidental: Secondary | ICD-10-CM

## 2017-07-18 DIAGNOSIS — Z3402 Encounter for supervision of normal first pregnancy, second trimester: Secondary | ICD-10-CM

## 2017-07-18 DIAGNOSIS — Z363 Encounter for antenatal screening for malformations: Secondary | ICD-10-CM | POA: Diagnosis not present

## 2017-07-18 DIAGNOSIS — O9989 Other specified diseases and conditions complicating pregnancy, childbirth and the puerperium: Secondary | ICD-10-CM

## 2017-07-18 DIAGNOSIS — Z3A18 18 weeks gestation of pregnancy: Secondary | ICD-10-CM

## 2017-07-18 DIAGNOSIS — Z1389 Encounter for screening for other disorder: Secondary | ICD-10-CM

## 2017-07-18 DIAGNOSIS — R102 Pelvic and perineal pain: Secondary | ICD-10-CM

## 2017-07-18 LAB — POCT URINALYSIS DIPSTICK
Blood, UA: NEGATIVE
Glucose, UA: NEGATIVE
KETONES UA: NEGATIVE
Leukocytes, UA: NEGATIVE
Nitrite, UA: NEGATIVE
PROTEIN UA: NEGATIVE

## 2017-07-18 NOTE — Progress Notes (Addendum)
US 18 wks,cephalic,anterior placenta gr 0,cervical length 3.6 cm,normal right ovary,left oophorectomy,left adnexal with normal limits,SVP of fluid 4.8 cm,LVEICF 2 mm,FHR 154 bpm,EFW 228 g 57%,anatomy complete

## 2017-07-18 NOTE — Progress Notes (Signed)
   LOW-RISK PREGNANCY VISIT Patient name: Mariea ClontsSamantha B Dudas MRN [id number]  Date of birth: 05/19/1995 Chief Complaint:   Routine Prenatal Visit (US today; + cramping)  History of Present Illness:   Mariea ClontsSamantha B Pun is a 22 y.o. 242P1001 female at 7451w0d with an Estimated Date of Delivery: 12/19/17 being seen today for ongoing management of a low-risk pregnancy.  Today she reports pelvic pressure.  . Vag. Bleeding: None.   . denies leaking of fluid. Review of Systems:   Pertinent items are noted in HPI Denies abnormal vaginal discharge w/ itching/odor/irritation, headaches, visual changes, shortness of breath, chest pain, abdominal pain, severe nausea/vomiting, or problems with urination or bowel movements unless otherwise stated above. Pertinent History Reviewed:  Reviewed past medical,surgical, social, obstetrical and family history.  Reviewed problem list, medications and allergies. Physical Assessment:   Vitals:   07/18/17 1612  BP: (!) 88/60  Pulse: 86  Weight: 108 lb 8 oz (49.2 kg)  Body mass index is 21.19 kg/m.        Physical Examination:   General appearance: Well appearing, and in no distress  Mental status: Alert, oriented to person, place, and time  Skin: Warm & dry  Cardiovascular: Normal heart rate noted  Respiratory: Normal respiratory effort, no distress  Abdomen: Soft, gravid, nontender  Pelvic: Cervical exam deferred         Extremities: Edema: Trace  Fetal Status:          Results for orders placed or performed in visit on 07/18/17 (from the past 24 hour(s))  POCT urinalysis dipstick   Collection Time: 07/18/17  4:08 PM  Result Value Ref Range   Color, UA     Clarity, UA     Glucose, UA Negative Negative   Bilirubin, UA     Ketones, UA neg    Spec Grav, UA  1.010 - 1.025   Blood, UA neg    pH, UA  5.0 - 8.0   Protein, UA Negative Negative   Urobilinogen, UA  0.2 or 1.0 E.U./dL   Nitrite, UA neg    Leukocytes, UA Negative Negative   Appearance       Odor      Assessment & Plan:  1) Low-risk pregnancy G2P1001 at 3251w0d with an Estimated Date of Delivery: 12/19/17   2) LV EICF, isolated   Meds: No orders of the defined types were placed in this encounter.  Labs/procedures today:   Plan:  Continue routine obstetrical care   Reviewed:Pre Term labor symptoms and general obstetric precautions including but not limited to vaginal bleeding, contractions, leaking of fluid and fetal movement were reviewed in detail with the patient.  All questions were answered  Follow-up: Return in about 1 month (around 08/15/2017) for LROB.  Orders Placed This Encounter  Procedures  . POCT urinalysis dipstick   Lazaro ArmsLuther H Eure  07/18/2017 5:05 PM

## 2017-08-08 ENCOUNTER — Telehealth: Payer: Self-pay | Admitting: *Deleted

## 2017-08-08 NOTE — Telephone Encounter (Signed)
Pt called c/o cramps last night that woke her up and she has had some throughout the day. She states that she is staying well hydrated. No c/o bleeding, leaking of fluid, and baby is moving well. Advised to try taking Tylenol to see if that will help alleviate them. Advised that her uterus is growing and stretching and some cramping can be normal. Advised to push fluids and call back if any bleeding, leaking occurs or if baby movement decreases. Pt verbalized understanding.

## 2017-08-15 ENCOUNTER — Other Ambulatory Visit: Payer: Self-pay

## 2017-08-15 ENCOUNTER — Ambulatory Visit (INDEPENDENT_AMBULATORY_CARE_PROVIDER_SITE_OTHER): Payer: 59 | Admitting: Obstetrics & Gynecology

## 2017-08-15 ENCOUNTER — Encounter: Payer: Self-pay | Admitting: Obstetrics & Gynecology

## 2017-08-15 VITALS — BP 114/76 | HR 91 | Wt 117.0 lb

## 2017-08-15 DIAGNOSIS — Z3482 Encounter for supervision of other normal pregnancy, second trimester: Secondary | ICD-10-CM

## 2017-08-15 DIAGNOSIS — Z3A22 22 weeks gestation of pregnancy: Secondary | ICD-10-CM

## 2017-08-15 DIAGNOSIS — Z1389 Encounter for screening for other disorder: Secondary | ICD-10-CM

## 2017-08-15 DIAGNOSIS — Z331 Pregnant state, incidental: Secondary | ICD-10-CM

## 2017-08-15 LAB — POCT URINALYSIS DIPSTICK
Glucose, UA: NEGATIVE
KETONES UA: NEGATIVE
Leukocytes, UA: NEGATIVE
Nitrite, UA: NEGATIVE
Protein, UA: NEGATIVE
RBC UA: NEGATIVE

## 2017-08-15 NOTE — Progress Notes (Signed)
G2P1001 8422w0d Estimated Date of Delivery: 12/19/17  Blood pressure 114/76, pulse 91, weight 117 lb (53.1 kg), last menstrual period 03/14/2017.   BP weight and urine results all reviewed and noted.  Please refer to the obstetrical flow sheet for the fundal height and fetal heart rate documentation:  Patient reports good fetal movement, denies any bleeding and no rupture of membranes symptoms or regular contractions. Patient is without complaints. All questions were answered.  Orders Placed This Encounter  Procedures  . POCT urinalysis dipstick    Plan:  Continued routine obstetrical care, wet prep is negative for yeast, WBC, trichomonas or BV, normal WBC infiltrate  Return in about 1 month (around 09/12/2017) for LROB.

## 2017-08-18 ENCOUNTER — Encounter (HOSPITAL_COMMUNITY): Payer: Self-pay

## 2017-08-18 ENCOUNTER — Inpatient Hospital Stay (HOSPITAL_COMMUNITY)
Admission: AD | Admit: 2017-08-18 | Discharge: 2017-08-19 | Disposition: A | Discharge status: Home / Self Care | Payer: Managed Care, Other (non HMO) | Source: Ambulatory Visit | Attending: Obstetrics and Gynecology | Admitting: Obstetrics and Gynecology

## 2017-08-18 DIAGNOSIS — F419 Anxiety disorder, unspecified: Secondary | ICD-10-CM | POA: Insufficient documentation

## 2017-08-18 DIAGNOSIS — F329 Major depressive disorder, single episode, unspecified: Secondary | ICD-10-CM | POA: Diagnosis not present

## 2017-08-18 DIAGNOSIS — Z8249 Family history of ischemic heart disease and other diseases of the circulatory system: Secondary | ICD-10-CM | POA: Diagnosis not present

## 2017-08-18 DIAGNOSIS — O99342 Other mental disorders complicating pregnancy, second trimester: Secondary | ICD-10-CM | POA: Diagnosis not present

## 2017-08-18 DIAGNOSIS — O4692 Antepartum hemorrhage, unspecified, second trimester: Secondary | ICD-10-CM | POA: Insufficient documentation

## 2017-08-18 DIAGNOSIS — Z3482 Encounter for supervision of other normal pregnancy, second trimester: Secondary | ICD-10-CM

## 2017-08-18 DIAGNOSIS — Z91040 Latex allergy status: Secondary | ICD-10-CM | POA: Diagnosis not present

## 2017-08-18 DIAGNOSIS — Z6790 Unspecified blood type, Rh positive: Secondary | ICD-10-CM | POA: Diagnosis not present

## 2017-08-18 DIAGNOSIS — Z3A22 22 weeks gestation of pregnancy: Secondary | ICD-10-CM | POA: Diagnosis not present

## 2017-08-18 DIAGNOSIS — O34219 Maternal care for unspecified type scar from previous cesarean delivery: Secondary | ICD-10-CM | POA: Diagnosis not present

## 2017-08-18 DIAGNOSIS — O368120 Decreased fetal movements, second trimester, not applicable or unspecified: Secondary | ICD-10-CM | POA: Diagnosis not present

## 2017-08-18 NOTE — MAU Note (Signed)
Pt. Presents to MAU stating baby has not moved as usual today. Pt. States she has seen a pink tinged discharge today.

## 2017-08-18 NOTE — MAU Note (Signed)
Pt reports some vaginal bleeding when she wiped tonight. Pt denies recent intercourse, pelvic exam, etc. Pt also reports decreased fetal movement stating she last felt baby move about 1 hour ago.

## 2017-08-18 NOTE — MAU Provider Note (Signed)
History     CSN: [id number]  Arrival date and time: 08/18/17 2338   Chief Complaint  Patient presents with  . Decreased Fetal Movement  . Vaginal Bleeding   G2P1001 @22 .4 wks here with decreased FM and spotting. She is feeling FM but less today. States she didn't feel 10 mov/2 hrs and was instructed to come in. Spotting happened today. Saw pink on the toilet paper twice. No recent IC. No vaginal discharge. She is eating and drinking well. Denies abd pain or cramping.    OB History    Gravida  2   Para  1   Term  1   Preterm  0   AB  0   Living  1     SAB      TAB      Ectopic      Multiple      Live Births  1           Past Medical History:  Diagnosis Date  . Anxiety   . Arthritis   . Depression     Past Surgical History:  Procedure Laterality Date  . CESAREAN SECTION    . HERNIA REPAIR     22 yrs old  . LAPAROSCOPIC LYSIS OF ADHESIONS  12/21/2016   Procedure: LAPAROSCOPIC LYSIS OF ADHESIONS;  Surgeon: Tilda Burrow, MD;  Location: [facility];  Service: Gynecology;;  . LAPAROSCOPIC SALPINGO OOPHERECTOMY Left 12/21/2016   Procedure: LAPAROSCOPIC LEFT SALPINGO OOPHORECTOMY;  Surgeon: Tilda Burrow, MD;  Location: [facility];  Service: Gynecology;  Laterality: Left;  . LAPAROSCOPY  12/21/2016   Procedure: LAPAROSCOPY DIAGNOSTIC;  Surgeon: Tilda Burrow, MD;  Location: [facility];  Service: Gynecology;;    Family History  Problem Relation Age of Onset  . Heart disease Paternal Grandfather   . Depression Maternal Grandmother   . Drug abuse Father   . Hypertension Father   . Hypertension Paternal Aunt     Social History   Tobacco Use  . Smoking status: Never Smoker  . Smokeless tobacco: Never Used  Substance Use Topics  . Alcohol use: Not Currently    Frequency: Never    Comment: occ; not now  . Drug use: No    Allergies:  Allergies  Allergen Reactions  . Latex Rash    Medications Prior to Admission  Medication Sig Dispense Refill Last  Dose  . Prenatal Vit-Fe Fumarate-FA (PRENATAL VITAMIN PO) Take by mouth daily.   08/18/2017 at Unknown time    Review of Systems  Gastrointestinal: Negative for abdominal pain.  Genitourinary: Positive for vaginal bleeding. Negative for vaginal discharge.   Physical Exam   Blood pressure 105/67, pulse 81, temperature 98.7 F (37.1 C), resp. rate 16, height 5' (1.524 m), weight 117 lb (53.1 kg), last menstrual period 03/14/2017, SpO2 100 %.  Physical Exam  Constitutional: She is oriented to person, place, and time. She appears well-developed and well-nourished. No distress.  HENT:  Head: Normocephalic and atraumatic.  Neck: Normal range of motion.  Cardiovascular: Normal rate.  Respiratory: Effort normal. No respiratory distress.  GI: Soft. She exhibits no distension. There is no tenderness.  gravid  Genitourinary:  Genitourinary Comments: External: no lesions or erythema Vagina: rugated, pink, moist, thick creamy discharge, no blood Cervix closed/long, ectropian present   Musculoskeletal: Normal range of motion.  Neurological: She is alert and oriented to person, place, and time.  Skin: Skin is warm and dry.  Psychiatric: She has a normal mood and affect.  FHT 152 Limited bedside US: viable, ++active fetus, +cardiac activity, subj. nml AFV  Results for orders placed or performed during the hospital encounter of 08/18/17 (from the past 24 hour(s))  Urinalysis, Routine w reflex microscopic     Status: Abnormal   Collection Time: 08/19/17 12:02 AM  Result Value Ref Range   Color, Urine YELLOW YELLOW   APPearance HAZY (A) CLEAR   Specific Gravity, Urine 1.020 1.005 - 1.030   pH 6.0 5.0 - 8.0   Glucose, UA NEGATIVE NEGATIVE mg/dL   Hgb urine dipstick NEGATIVE NEGATIVE   Bilirubin Urine NEGATIVE NEGATIVE   Ketones, ur NEGATIVE NEGATIVE mg/dL   Protein, ur NEGATIVE NEGATIVE mg/dL   Nitrite NEGATIVE NEGATIVE   Leukocytes, UA NEGATIVE NEGATIVE  Wet prep, genital     Status:  Abnormal   Collection Time: 08/19/17 12:17 AM  Result Value Ref Range   Yeast Wet Prep HPF POC NONE SEEN NONE SEEN   Trich, Wet Prep NONE SEEN NONE SEEN   Clue Cells Wet Prep HPF POC NONE SEEN NONE SEEN   WBC, Wet Prep HPF POC MODERATE (A) NONE SEEN   Sperm NONE SEEN    MAU Course  Procedures  MDM Prenatal records reviewed. Pregnancy is complicated by depression/anxiety not on meds, previous CS, and left EICF. Labs ordered and reviewed. FM seen on US, pt reassured. Discussed FM not consistent at this GA. No VB identified. Stable for discharge home.   Assessment and Plan   1. [redacted] weeks gestation of pregnancy   2. Encounter for supervision of other normal pregnancy in second trimester   3. Decreased fetal movements in second trimester, single or unspecified fetus   4. Blood type, Rh positive    Discharge home Follow up in OB office as scheduled PTL precautions Bleeding precautions  Allergies as of 08/19/2017      Reactions   Latex Rash      Medication List    TAKE these medications   PRENATAL VITAMIN PO Take by mouth daily.      Donette LarryMelanie Verna Desrocher, CNM 08/19/2017, 12:56 AM

## 2017-08-19 ENCOUNTER — Other Ambulatory Visit: Payer: Self-pay

## 2017-08-19 LAB — URINALYSIS, ROUTINE W REFLEX MICROSCOPIC
BILIRUBIN URINE: NEGATIVE
Glucose, UA: NEGATIVE mg/dL
HGB URINE DIPSTICK: NEGATIVE
KETONES UR: NEGATIVE mg/dL
Leukocytes, UA: NEGATIVE
NITRITE: NEGATIVE
Protein, ur: NEGATIVE mg/dL
Specific Gravity, Urine: 1.02 (ref 1.005–1.030)
pH: 6 (ref 5.0–8.0)

## 2017-08-19 LAB — WET PREP, GENITAL
Clue Cells Wet Prep HPF POC: NONE SEEN
Sperm: NONE SEEN
TRICH WET PREP: NONE SEEN
Yeast Wet Prep HPF POC: NONE SEEN

## 2017-08-19 NOTE — Discharge Instructions (Signed)
Vaginal Bleeding During Pregnancy, Second Trimester A small amount of bleeding (spotting) from the vagina is common in pregnancy. Sometimes the bleeding is normal and is not a problem, and sometimes it is a sign of something serious. Be sure to tell your doctor about any bleeding from your vagina right away. Follow these instructions at home:  Watch your condition for any changes.  Follow your doctor's instructions about how active you can be.  If you are on bed rest: ? You may need to stay in bed and only get up to use the bathroom. ? You may be allowed to do some activities. ? If you need help, make plans for someone to help you.  Write down: ? The number of pads you use each day. ? How often you change pads. ? How soaked (saturated) your pads are.  Do not use tampons.  Do not douche.  Do not have sex or orgasms until your doctor says it is okay.  If you pass any tissue from your vagina, save the tissue so you can show it to your doctor.  Only take medicines as told by your doctor.  Do not take aspirin because it can make you bleed.  Do not exercise, lift heavy weights, or do any activities that take a lot of energy and effort unless your doctor says it is okay.  Keep all follow-up visits as told by your doctor. Contact a doctor if:  You bleed from your vagina.  You have cramps.  You have labor pains.  You have a fever that does not go away after you take medicine. Get help right away if:  You have very bad cramps in your back or belly (abdomen).  You have contractions.  You have chills.  You pass large clots or tissue from your vagina.  You bleed more.  You feel light-headed or weak.  You pass out (faint).  You are leaking fluid or have a gush of fluid from your vagina. This information is not intended to replace advice given to you by your health care provider. Make sure you discuss any questions you have with your health care provider. Document  Released: 06/18/2013 Document Revised: 07/10/2015 Document Reviewed: 10/09/2012 Elsevier Interactive Patient Education  2018 [employer].  

## 2017-08-22 LAB — GC/CHLAMYDIA PROBE AMP (~~LOC~~) NOT AT ARMC
Chlamydia: NEGATIVE
NEISSERIA GONORRHEA: NEGATIVE

## 2017-09-19 ENCOUNTER — Encounter: Payer: Self-pay | Admitting: Women's Health

## 2017-09-19 ENCOUNTER — Ambulatory Visit (INDEPENDENT_AMBULATORY_CARE_PROVIDER_SITE_OTHER): Payer: 59 | Admitting: Women's Health

## 2017-09-19 VITALS — BP 101/72 | HR 60 | Wt 125.0 lb

## 2017-09-19 DIAGNOSIS — O34219 Maternal care for unspecified type scar from previous cesarean delivery: Secondary | ICD-10-CM

## 2017-09-19 DIAGNOSIS — Z3A27 27 weeks gestation of pregnancy: Secondary | ICD-10-CM

## 2017-09-19 DIAGNOSIS — Z331 Pregnant state, incidental: Secondary | ICD-10-CM

## 2017-09-19 DIAGNOSIS — Z23 Encounter for immunization: Secondary | ICD-10-CM | POA: Diagnosis not present

## 2017-09-19 DIAGNOSIS — Z3482 Encounter for supervision of other normal pregnancy, second trimester: Secondary | ICD-10-CM

## 2017-09-19 DIAGNOSIS — Z1389 Encounter for screening for other disorder: Secondary | ICD-10-CM

## 2017-09-19 LAB — POCT URINALYSIS DIPSTICK OB
Glucose, UA: NEGATIVE — AB
Ketones, UA: NEGATIVE
LEUKOCYTES UA: NEGATIVE
NITRITE UA: NEGATIVE
PROTEIN: NEGATIVE
RBC UA: NEGATIVE

## 2017-09-19 NOTE — Progress Notes (Signed)
   LOW-RISK PREGNANCY VISIT Patient name: Jessica ClontsSamantha B Toth MRN [id number]  Date of birth: 01/19/1996 Chief Complaint:   Routine Prenatal Visit  History of Present Illness:   Jessica ClontsSamantha B Stille is a 22 y.o. 252P1001 female at 2775w0d with an Estimated Date of Delivery: 12/19/17 being seen today for ongoing management of a low-risk pregnancy.  Today she reports some sob, belly pulling around umbilicus. Wants BTL.  Contractions: Not present. Vag. Bleeding: None.  Movement: Present. denies leaking of fluid. Review of Systems:   Pertinent items are noted in HPI Denies abnormal vaginal discharge w/ itching/odor/irritation, headaches, visual changes, shortness of breath, chest pain, abdominal pain, severe nausea/vomiting, or problems with urination or bowel movements unless otherwise stated above. Pertinent History Reviewed:  Reviewed past medical,surgical, social, obstetrical and family history.  Reviewed problem list, medications and allergies. Physical Assessment:   Vitals:   09/19/17 0924  BP: 101/72  Pulse: 60  Weight: 125 lb (56.7 kg)  Body mass index is 24.41 kg/m.        Physical Examination:   General appearance: Well appearing, and in no distress  Mental status: Alert, oriented to person, place, and time  Skin: Warm & dry  Cardiovascular: Normal heart rate noted  Respiratory: Normal respiratory effort, no distress  Abdomen: Soft, gravid, nontender  Pelvic: Cervical exam deferred         Extremities: Edema: Trace  Fetal Status: Fetal Heart Rate (bpm): 153 Fundal Height: 28 cm Movement: Present    Results for orders placed or performed in visit on 09/19/17 (from the past 24 hour(s))  POC Urinalysis Dipstick OB   Collection Time: 09/19/17  9:29 AM  Result Value Ref Range   Color, UA     Clarity, UA     Glucose, UA Negative (A) (none)   Bilirubin, UA     Ketones, UA neg    Spec Grav, UA  1.010 - 1.025   Blood, UA neg    pH, UA  5.0 - 8.0   POC Protein UA Negative  Negative, Trace   Urobilinogen, UA  0.2 or 1.0 E.U./dL   Nitrite, UA neg    Leukocytes, UA Negative Negative   Appearance     Odor      Assessment & Plan:  1) Low-risk pregnancy G2P1001 at 9275w0d with an Estimated Date of Delivery: 12/19/17   2) Physiological SOB, discussed relief measures, positioning for providing more space, reasons to seek care  3) Wants BTL, 22yo, discussed high incidence of regret <30yo, LARCs just as effective, adamant- no mcaid,    Meds: No orders of the defined types were placed in this encounter.  Labs/procedures today: tdap  Plan:  Continue routine obstetrical care   Reviewed: Preterm labor symptoms and general obstetric precautions including but not limited to vaginal bleeding, contractions, leaking of fluid and fetal movement were reviewed in detail with the patient.  All questions were answered  Follow-up: Return for this week or next for pn2 (no visit), then 3wks for LROB.  Orders Placed This Encounter  Procedures  . POC Urinalysis Dipstick OB   Cheral MarkerKimberly R Jakub Debold CNM, [facility]NP-BC 09/19/2017 10:11 AM

## 2017-09-19 NOTE — Patient Instructions (Addendum)
Jessica Poole, I greatly value your feedback.  If you receive a survey following your visit with [place] today, we appreciate you taking the time to fill it out.  Thanks, [name], CNM, WHNP-BC  You will have your sugar test next visit.  Please do not eat or drink anything after midnight the night before you come, not even water.  You will be here for at least two hours.      Call the office [phone]) or go to [facility] if:  You begin to have strong, frequent contractions  Your water breaks.  Sometimes it is a big gush of fluid, sometimes it is just a trickle that keeps getting your panties wet or running down your legs  You have vaginal bleeding.  It is normal to have a small amount of spotting if your cervix was checked.   You don't feel your baby moving like normal.  If you don't, get you something to eat and drink and lay down and focus on feeling your baby move.  You should feel at least 10 movements in 2 hours.  If you don't, you should call the office or go to [facility].    Tdap Vaccine  It is recommended that you get the Tdap vaccine during the third trimester of EACH pregnancy to help protect your baby from getting pertussis (whooping cough)  27-36 weeks is the BEST time to do this so that you can pass the protection on to your baby. During pregnancy is better than after pregnancy, but if you are unable to get it during pregnancy it will be offered at the hospital.   You can get this vaccine with [place], at the health department, your family doctor, or some local pharmacies  Everyone who will be around your baby should also be up-to-date on their vaccines before the baby comes. Adults (who are not pregnant) only need 1 dose of Tdap during adulthood.   Third Trimester of Pregnancy The third trimester is from week 29 through week 42, months 7 through 9. The third trimester is a time when the fetus is growing rapidly. At the end of the ninth month, the fetus is about 20  inches in length and weighs 6-10 pounds.  BODY CHANGES Your body goes through many changes during pregnancy. The changes vary from woman to woman.   Your weight will continue to increase. You can expect to gain 25-35 pounds (11-16 kg) by the end of the pregnancy.  You may begin to get stretch marks on your hips, abdomen, and breasts.  You may urinate more often because the fetus is moving lower into your pelvis and pressing on your bladder.  You may develop or continue to have heartburn as a result of your pregnancy.  You may develop constipation because certain hormones are causing the muscles that push waste through your intestines to slow down.  You may develop hemorrhoids or swollen, bulging veins (varicose veins).  You may have pelvic pain because of the weight gain and pregnancy hormones relaxing your joints between the bones in your pelvis. Backaches may result from overexertion of the muscles supporting your posture.  You may have changes in your hair. These can include thickening of your hair, rapid growth, and changes in texture. Some women also have hair loss during or after pregnancy, or hair that feels dry or thin. Your hair will most likely return to normal after your baby is born.  Your breasts will continue to grow and be  tender. A yellow discharge may leak from your breasts called colostrum.  Your belly button may stick out.  You may feel short of breath because of your expanding uterus.  You may notice the fetus "dropping," or moving lower in your abdomen.  You may have a bloody mucus discharge. This usually occurs a few days to a week before labor begins.  Your cervix becomes thin and soft (effaced) near your due date. WHAT TO EXPECT AT YOUR PRENATAL EXAMS  You will have prenatal exams every 2 weeks until week 36. Then, you will have weekly prenatal exams. During a routine prenatal visit:  You will be weighed to make sure you and the fetus are growing  normally.  Your blood pressure is taken.  Your abdomen will be measured to track your baby's growth.  The fetal heartbeat will be listened to.  Any test results from the previous visit will be discussed.  You may have a cervical check near your due date to see if you have effaced. At around 36 weeks, your caregiver will check your cervix. At the same time, your caregiver will also perform a test on the secretions of the vaginal tissue. This test is to determine if a type of bacteria, Group B streptococcus, is present. Your caregiver will explain this further. Your caregiver may ask you:  What your birth plan is.  How you are feeling.  If you are feeling the baby move.  If you have had any abnormal symptoms, such as leaking fluid, bleeding, severe headaches, or abdominal cramping.  If you have any questions. Other tests or screenings that may be performed during your third trimester include:  Blood tests that check for low iron levels (anemia).  Fetal testing to check the health, activity level, and growth of the fetus. Testing is done if you have certain medical conditions or if there are problems during the pregnancy. FALSE LABOR You may feel small, irregular contractions that eventually go away. These are called Braxton Hicks contractions, or false labor. Contractions may last for hours, days, or even weeks before true labor sets in. If contractions come at regular intervals, intensify, or become painful, it is best to be seen by your caregiver.  SIGNS OF LABOR   Menstrual-like cramps.  Contractions that are 5 minutes apart or less.  Contractions that start on the top of the uterus and spread down to the lower abdomen and back.  A sense of increased pelvic pressure or back pain.  A watery or bloody mucus discharge that comes from the vagina. If you have any of these signs before the 37th week of pregnancy, call your caregiver right away. You need to go to the hospital to  get checked immediately. HOME CARE INSTRUCTIONS   Avoid all smoking, herbs, alcohol, and unprescribed drugs. These chemicals affect the formation and growth of the baby.  Follow your caregiver's instructions regarding medicine use. There are medicines that are either safe or unsafe to take during pregnancy.  Exercise only as directed by your caregiver. Experiencing uterine cramps is a good sign to stop exercising.  Continue to eat regular, healthy meals.  Wear a good support bra for breast tenderness.  Do not use hot tubs, steam rooms, or saunas.  Wear your seat belt at all times when driving.  Avoid raw meat, uncooked cheese, cat litter boxes, and soil used by cats. These carry germs that can cause birth defects in the baby.  Take your prenatal vitamins.  Try taking a  stool softener (if your caregiver approves) if you develop constipation. Eat more high-fiber foods, such as fresh vegetables or fruit and whole grains. Drink plenty of fluids to keep your urine clear or pale yellow.  Take warm sitz baths to soothe any pain or discomfort caused by hemorrhoids. Use hemorrhoid cream if your caregiver approves.  If you develop varicose veins, wear support hose. Elevate your feet for 15 minutes, 3-4 times a day. Limit salt in your diet.  Avoid heavy lifting, wear low heal shoes, and practice good posture.  Rest a lot with your legs elevated if you have leg cramps or low back pain.  Visit your dentist if you have not gone during your pregnancy. Use a soft toothbrush to brush your teeth and be gentle when you floss.  A sexual relationship may be continued unless your caregiver directs you otherwise.  Do not travel far distances unless it is absolutely necessary and only with the approval of your caregiver.  Take prenatal classes to understand, practice, and ask questions about the labor and delivery.  Make a trial run to the hospital.  Pack your hospital bag.  Prepare the baby's  nursery.  Continue to go to all your prenatal visits as directed by your caregiver. SEEK MEDICAL CARE IF:  You are unsure if you are in labor or if your water has broken.  You have dizziness.  You have mild pelvic cramps, pelvic pressure, or nagging pain in your abdominal area.  You have persistent nausea, vomiting, or diarrhea.  You have a bad smelling vaginal discharge.  You have pain with urination. SEEK IMMEDIATE MEDICAL CARE IF:   You have a fever.  You are leaking fluid from your vagina.  You have spotting or bleeding from your vagina.  You have severe abdominal cramping or pain.  You have rapid weight loss or gain.  You have shortness of breath with chest pain.  You notice sudden or extreme swelling of your face, hands, ankles, feet, or legs.  You have not felt your baby move in over an hour.  You have severe headaches that do not go away with medicine.  You have vision changes. Document Released: 01/26/2001 Document Revised: 02/06/2013 Document Reviewed: 04/04/2012 [facility] Patient Information 2015 [place] [place]. This information is not intended to replace advice given to you by your health care provider. Make sure you discuss any questions you have with your health care provider.

## 2017-09-21 ENCOUNTER — Other Ambulatory Visit: Payer: 59

## 2017-09-21 DIAGNOSIS — Z131 Encounter for screening for diabetes mellitus: Secondary | ICD-10-CM

## 2017-09-21 DIAGNOSIS — Z3482 Encounter for supervision of other normal pregnancy, second trimester: Secondary | ICD-10-CM

## 2017-09-22 LAB — CBC
Hematocrit: 41.1 % (ref 34.0–46.6)
Hemoglobin: 13.8 g/dL (ref 11.1–15.9)
MCH: 33.5 pg — AB (ref 26.6–33.0)
MCHC: 33.6 g/dL (ref 31.5–35.7)
MCV: 100 fL — ABNORMAL HIGH (ref 79–97)
PLATELETS: 173 10*3/uL (ref 150–450)
RBC: 4.12 x10E6/uL (ref 3.77–5.28)
RDW: 13 % (ref 12.3–15.4)
WBC: 9.8 10*3/uL (ref 3.4–10.8)

## 2017-09-22 LAB — ANTIBODY SCREEN: Antibody Screen: NEGATIVE

## 2017-09-22 LAB — HIV ANTIBODY (ROUTINE TESTING W REFLEX): HIV Screen 4th Generation wRfx: NONREACTIVE

## 2017-09-22 LAB — GLUCOSE TOLERANCE, 2 HOURS W/ 1HR
Glucose, 1 hour: 87 mg/dL (ref 65–179)
Glucose, 2 hour: 77 mg/dL (ref 65–152)
Glucose, Fasting: 72 mg/dL (ref 65–91)

## 2017-09-22 LAB — RPR: RPR Ser Ql: NONREACTIVE

## 2017-10-01 ENCOUNTER — Encounter (HOSPITAL_COMMUNITY): Payer: Self-pay

## 2017-10-01 ENCOUNTER — Other Ambulatory Visit: Payer: Self-pay

## 2017-10-01 ENCOUNTER — Inpatient Hospital Stay (HOSPITAL_COMMUNITY)
Admission: AD | Admit: 2017-10-01 | Discharge: 2017-10-01 | Disposition: A | Discharge status: Home / Self Care | Payer: 59 | Source: Ambulatory Visit | Attending: Family Medicine | Admitting: Family Medicine

## 2017-10-01 DIAGNOSIS — Z3A28 28 weeks gestation of pregnancy: Secondary | ICD-10-CM | POA: Diagnosis not present

## 2017-10-01 DIAGNOSIS — Z3482 Encounter for supervision of other normal pregnancy, second trimester: Secondary | ICD-10-CM

## 2017-10-01 DIAGNOSIS — M549 Dorsalgia, unspecified: Secondary | ICD-10-CM

## 2017-10-01 DIAGNOSIS — N949 Unspecified condition associated with female genital organs and menstrual cycle: Secondary | ICD-10-CM

## 2017-10-01 DIAGNOSIS — O9989 Other specified diseases and conditions complicating pregnancy, childbirth and the puerperium: Secondary | ICD-10-CM

## 2017-10-01 DIAGNOSIS — O4703 False labor before 37 completed weeks of gestation, third trimester: Secondary | ICD-10-CM | POA: Diagnosis not present

## 2017-10-01 DIAGNOSIS — O479 False labor, unspecified: Secondary | ICD-10-CM

## 2017-10-01 LAB — URINALYSIS, ROUTINE W REFLEX MICROSCOPIC
BILIRUBIN URINE: NEGATIVE
GLUCOSE, UA: NEGATIVE mg/dL
Hgb urine dipstick: NEGATIVE
KETONES UR: NEGATIVE mg/dL
Leukocytes, UA: NEGATIVE
Nitrite: NEGATIVE
PH: 9 — AB (ref 5.0–8.0)
Protein, ur: NEGATIVE mg/dL
Specific Gravity, Urine: 1.005 (ref 1.005–1.030)

## 2017-10-01 LAB — CBC WITH DIFFERENTIAL/PLATELET
BASOS ABS: 0 10*3/uL (ref 0.0–0.1)
Basophils Relative: 0 %
EOS PCT: 0 %
Eosinophils Absolute: 0.1 10*3/uL (ref 0.0–0.7)
HEMATOCRIT: 36.4 % (ref 36.0–46.0)
HEMOGLOBIN: 12.9 g/dL (ref 12.0–15.0)
LYMPHS PCT: 4 %
Lymphs Abs: 0.6 10*3/uL — ABNORMAL LOW (ref 0.7–4.0)
MCH: 33.4 pg (ref 26.0–34.0)
MCHC: 35.4 g/dL (ref 30.0–36.0)
MCV: 94.3 fL (ref 78.0–100.0)
Monocytes Absolute: 0.5 10*3/uL (ref 0.1–1.0)
Monocytes Relative: 3 %
NEUTROS ABS: 13 10*3/uL — AB (ref 1.7–7.7)
NEUTROS PCT: 93 %
PLATELETS: 141 10*3/uL — AB (ref 150–400)
RBC: 3.86 MIL/uL — AB (ref 3.87–5.11)
RDW: 12.3 % (ref 11.5–15.5)
WBC: 14.1 10*3/uL — ABNORMAL HIGH (ref 4.0–10.5)

## 2017-10-01 LAB — COMPREHENSIVE METABOLIC PANEL
ALT: 10 U/L (ref 0–44)
AST: 17 U/L (ref 15–41)
Albumin: 3.2 g/dL — ABNORMAL LOW (ref 3.5–5.0)
Alkaline Phosphatase: 73 U/L (ref 38–126)
Anion gap: 11 (ref 5–15)
BUN: 7 mg/dL (ref 6–20)
CHLORIDE: 105 mmol/L (ref 98–111)
CO2: 18 mmol/L — ABNORMAL LOW (ref 22–32)
CREATININE: 0.49 mg/dL (ref 0.44–1.00)
Calcium: 8.9 mg/dL (ref 8.9–10.3)
GFR calc Af Amer: >60 mL/min (ref >60)
Glucose, Bld: 88 mg/dL (ref 70–99)
Potassium: 3.4 mmol/L — ABNORMAL LOW (ref 3.5–5.1)
Sodium: 134 mmol/L — ABNORMAL LOW (ref 135–145)
Total Bilirubin: 0.6 mg/dL (ref 0.3–1.2)
Total Protein: 6.1 g/dL — ABNORMAL LOW (ref 6.5–8.1)

## 2017-10-01 LAB — RAPID URINE DRUG SCREEN, HOSP PERFORMED
Amphetamines: NOT DETECTED
BARBITURATES: NOT DETECTED
Benzodiazepines: NOT DETECTED
Cocaine: NOT DETECTED
Opiates: NOT DETECTED
Tetrahydrocannabinol: NOT DETECTED

## 2017-10-01 MED ORDER — NIFEDIPINE 10 MG PO CAPS
10.0000 mg | ORAL_CAPSULE | ORAL | Status: AC
  Administered 2017-10-01 (×3): 10 mg via ORAL
  Filled 2017-10-01 (×3): qty 1

## 2017-10-01 MED ORDER — LACTATED RINGERS IV BOLUS
1000.0000 mL | Freq: Once | INTRAVENOUS | Status: AC
  Administered 2017-10-01: 1000 mL via INTRAVENOUS

## 2017-10-01 NOTE — MAU Provider Note (Signed)
History     CSN: [id number]  Arrival date and time: 10/01/17 [id number]   First Provider Initiated Contact with Patient 10/01/17 2010      Chief Complaint  Patient presents with  . Abdominal Pain  . Contractions   Jessica Poole is a 22 y.o. G2P1001 at 6758w5d who presents today with contractions. She states that they started around 10:00 today, and are occurring about every 4 mins. She rates her pain 10/10. She denies any VB or LOF. She reports normal fetal movements. She was seen at [facility] earlier today around 1500. She states that they did a test and told her that the baby would not come in the next 2 weeks. She states that since she left the pain has worsened. She denies any complications with this pregnancy.    OB History    Gravida  2   Para  1   Term  1   Preterm  0   AB  0   Living  1     SAB      TAB      Ectopic      Multiple      Live Births  1           Past Medical History:  Diagnosis Date  . Anxiety   . Arthritis   . Depression     Past Surgical History:  Procedure Laterality Date  . CESAREAN SECTION    . HERNIA REPAIR     22 yrs old  . LAPAROSCOPIC LYSIS OF ADHESIONS  12/21/2016   Procedure: LAPAROSCOPIC LYSIS OF ADHESIONS;  Surgeon: Tilda BurrowFerguson, John V, MD;  Location: [facility];  Service: Gynecology;;  . LAPAROSCOPIC SALPINGO OOPHERECTOMY Left 12/21/2016   Procedure: LAPAROSCOPIC LEFT SALPINGO OOPHORECTOMY;  Surgeon: Tilda BurrowFerguson, John V, MD;  Location: [facility];  Service: Gynecology;  Laterality: Left;  . LAPAROSCOPY  12/21/2016   Procedure: LAPAROSCOPY DIAGNOSTIC;  Surgeon: Tilda BurrowFerguson, John V, MD;  Location: [facility];  Service: Gynecology;;    Family History  Problem Relation Age of Onset  . Heart disease Paternal Grandfather   . Depression Maternal Grandmother   . Drug abuse Father   . Hypertension Father   . Hypertension Paternal Aunt     Social History   Tobacco Use  . Smoking status: Never Smoker  . Smokeless tobacco: Never Used   Substance Use Topics  . Alcohol use: Not Currently    Frequency: Never    Comment: occ; not now  . Drug use: No    Allergies:  Allergies  Allergen Reactions  . Latex Rash    Medications Prior to Admission  Medication Sig Dispense Refill Last Dose  . Prenatal Vit-Fe Fumarate-FA (PRENATAL VITAMIN PO) Take by mouth daily.   09/30/2017 at Unknown time    Review of Systems Physical Exam   Blood pressure 114/84, pulse (!) 112, temperature 99.5 F (37.5 C), temperature source Oral, resp. rate 18, height 5' (1.524 m), weight 57.2 kg, last menstrual period 03/14/2017.  Physical Exam  Nursing note and vitals reviewed. Constitutional: She is oriented to person, place, and time. She appears well-developed and well-nourished. No distress.  HENT:  Head: Normocephalic.  Cardiovascular: Normal rate.  Respiratory: Effort normal.  GI: Soft. There is no tenderness. There is no rebound.  Genitourinary:  Genitourinary Comments: Cervix: closed/thick/ballotable   Neurological: She is alert and oriented to person, place, and time.  Skin: Skin is warm and dry.  Psychiatric: She has a normal mood and affect.  NST:  Baseline: 155 Variability: moderate Accels: 15x15 Decels: none Toco: about every 8+    Results for orders placed or performed during the hospital encounter of 10/01/17 (from the past 24 hour(s))  Urinalysis, Routine w reflex microscopic     Status: Abnormal   Collection Time: 10/01/17  9:52 PM  Result Value Ref Range   Color, Urine STRAW (A) YELLOW   APPearance HAZY (A) CLEAR   Specific Gravity, Urine 1.005 1.005 - 1.030   pH 9.0 (H) 5.0 - 8.0   Glucose, UA NEGATIVE NEGATIVE mg/dL   Hgb urine dipstick NEGATIVE NEGATIVE   Bilirubin Urine NEGATIVE NEGATIVE   Ketones, ur NEGATIVE NEGATIVE mg/dL   Protein, ur NEGATIVE NEGATIVE mg/dL   Nitrite NEGATIVE NEGATIVE   Leukocytes, UA NEGATIVE NEGATIVE  Urine rapid drug screen (hosp performed)     Status: None   Collection  Time: 10/01/17  9:53 PM  Result Value Ref Range   Opiates NONE DETECTED NONE DETECTED   Cocaine NONE DETECTED NONE DETECTED   Benzodiazepines NONE DETECTED NONE DETECTED   Amphetamines NONE DETECTED NONE DETECTED   Tetrahydrocannabinol NONE DETECTED NONE DETECTED   Barbiturates NONE DETECTED NONE DETECTED  CBC with Differential/Platelet     Status: Abnormal   Collection Time: 10/01/17 10:24 PM  Result Value Ref Range   WBC 14.1 (H) 4.0 - 10.5 K/uL   RBC 3.86 (L) 3.87 - 5.11 MIL/uL   Hemoglobin 12.9 12.0 - 15.0 g/dL   HCT 16.1 09.6 - 04.5 %   MCV 94.3 78.0 - 100.0 fL   MCH 33.4 26.0 - 34.0 pg   MCHC 35.4 30.0 - 36.0 g/dL   RDW 40.9 81.1 - 91.4 %   Platelets 141 (L) 150 - 400 K/uL   Neutrophils Relative % 93 %   Neutro Abs 13.0 (H) 1.7 - 7.7 K/uL   Lymphocytes Relative 4 %   Lymphs Abs 0.6 (L) 0.7 - 4.0 K/uL   Monocytes Relative 3 %   Monocytes Absolute 0.5 0.1 - 1.0 K/uL   Eosinophils Relative 0 %   Eosinophils Absolute 0.1 0.0 - 0.7 K/uL   Basophils Relative 0 %   Basophils Absolute 0.0 0.0 - 0.1 K/uL  Comprehensive metabolic panel     Status: Abnormal   Collection Time: 10/01/17 10:24 PM  Result Value Ref Range   Sodium 134 (L) 135 - 145 mmol/L   Potassium 3.4 (L) 3.5 - 5.1 mmol/L   Chloride 105 98 - 111 mmol/L   CO2 18 (L) 22 - 32 mmol/L   Glucose, Bld 88 70 - 99 mg/dL   BUN 7 6 - 20 mg/dL   Creatinine, Ser 7.82 0.44 - 1.00 mg/dL   Calcium 8.9 8.9 - 95.6 mg/dL   Total Protein 6.1 (L) 6.5 - 8.1 g/dL   Albumin 3.2 (L) 3.5 - 5.0 g/dL   AST 17 15 - 41 U/L   ALT 10 0 - 44 U/L   Alkaline Phosphatase 73 38 - 126 U/L   Total Bilirubin 0.6 0.3 - 1.2 mg/dL   GFR calc non Af Amer >60 >60 mL/min   GFR calc Af Amer >60 >60 mL/min   Anion gap 11 5 - 15    MAU Course  Procedures  MDM Patient has had 3 doses of procardia. Contractions have spaced on the monitor. Cervix has remained closed, and per patient report she was closed with a negative FFN when she was at [facility]  today. Patient offered medication for back pain. She  refuses. Will DC home with return precautions.   Assessment and Plan   1. Threatened premature labor in third trimester   2. Encounter for supervision of other normal pregnancy in second trimester   3. Braxton Hicks contractions   4. Back pain in pregnancy   5. Round ligament pain   6. [redacted] weeks gestation of pregnancy    DC home Comfort measures reviewed  3rd Trimester precautions  PTL precautions  Fetal kick counts RX: none  Return to MAU as needed FU with OB as planned  Follow-up Information    [facility] Follow up.   Specialty:  Obstetrics and Gynecology Contact information: [address] [place] [id number] [phone]           Thressa ShellerHeather Salimah Martinovich 10/01/2017, 8:12 PM

## 2017-10-01 NOTE — MAU Note (Signed)
States she was at [facility] several hours ago and was contracting 4 min apart. Had a US and was not dilated. Now here as she states the pain is a lot worse. Rates pain 9/10 in abdomen describes as pulled muscle. States ctx are stronger but still 4 mins apart. Feels +FM. Denies leaking and leaking.

## 2017-10-01 NOTE — Discharge Instructions (Signed)

## 2017-10-03 ENCOUNTER — Telehealth: Payer: Self-pay | Admitting: *Deleted

## 2017-10-03 NOTE — Telephone Encounter (Signed)
LMOVM that some pressure is normal in which some patients need a maternity support band.  Encouraged to push fluids and if contractions return, to let us know.

## 2017-10-10 ENCOUNTER — Ambulatory Visit (INDEPENDENT_AMBULATORY_CARE_PROVIDER_SITE_OTHER): Payer: 59 | Admitting: Women's Health

## 2017-10-10 ENCOUNTER — Encounter: Payer: Self-pay | Admitting: Women's Health

## 2017-10-10 VITALS — BP 98/69 | HR 85 | Wt 130.0 lb

## 2017-10-10 DIAGNOSIS — Z3483 Encounter for supervision of other normal pregnancy, third trimester: Secondary | ICD-10-CM

## 2017-10-10 DIAGNOSIS — O403XX0 Polyhydramnios, third trimester, not applicable or unspecified: Secondary | ICD-10-CM

## 2017-10-10 DIAGNOSIS — Z1389 Encounter for screening for other disorder: Secondary | ICD-10-CM

## 2017-10-10 DIAGNOSIS — Z3A30 30 weeks gestation of pregnancy: Secondary | ICD-10-CM

## 2017-10-10 DIAGNOSIS — Z331 Pregnant state, incidental: Secondary | ICD-10-CM

## 2017-10-10 LAB — POCT URINALYSIS DIPSTICK OB
Blood, UA: NEGATIVE
Glucose, UA: NEGATIVE — AB
Ketones, UA: NEGATIVE
LEUKOCYTES UA: NEGATIVE
NITRITE UA: NEGATIVE
PROTEIN: NEGATIVE

## 2017-10-10 NOTE — Progress Notes (Signed)
   LOW-RISK PREGNANCY VISIT Patient name: Jessica ClontsSamantha B Coy MRN [id number]  Date of birth: 03/01/1995 Chief Complaint:   Routine Prenatal Visit  History of Present Illness:   Jessica Poole is a 22 y.o. 502P1001 female at 6369w0d with an Estimated Date of Delivery: 12/19/17 being seen today for ongoing management of a low-risk pregnancy.  Today she reports had to go to hospital for uc's, [facility], had u/s that showed 'too much fluid' but everything was ok; [facility] 3 doses po procardia, no cervical change. Occ uc's since, nothing like they were.  . Vag. Bleeding: None.  Movement: Present. denies leaking of fluid. Review of Systems:   Pertinent items are noted in HPI Denies abnormal vaginal discharge w/ itching/odor/irritation, headaches, visual changes, shortness of breath, chest pain, abdominal pain, severe nausea/vomiting, or problems with urination or bowel movements unless otherwise stated above. Pertinent History Reviewed:  Reviewed past medical,surgical, social, obstetrical and family history.  Reviewed problem list, medications and allergies. Physical Assessment:   Vitals:   10/10/17 1344  BP: 98/69  Pulse: 85  Weight: 130 lb (59 kg)  Body mass index is 25.39 kg/m.        Physical Examination:   General appearance: Well appearing, and in no distress  Mental status: Alert, oriented to person, place, and time  Skin: Warm & dry  Cardiovascular: Normal heart rate noted  Respiratory: Normal respiratory effort, no distress  Abdomen: Soft, gravid, nontender  Pelvic: Cervical exam deferred         Extremities: Edema: Trace  Fetal Status: Fetal Heart Rate (bpm): 144 Fundal Height: 32 cm Movement: Present    Results for orders placed or performed in visit on 10/10/17 (from the past 24 hour(s))  POC Urinalysis Dipstick OB   Collection Time: 10/10/17  1:46 PM  Result Value Ref Range   Color, UA     Clarity, UA     Glucose, UA Negative (A) (none)     Bilirubin, UA     Ketones, UA neg    Spec Grav, UA     Blood, UA neg    pH, UA     POC Protein UA Negative Negative, Trace   Urobilinogen, UA     Nitrite, UA neg    Leukocytes, UA Negative Negative   Appearance     Odor      Assessment & Plan:  1) Low-risk pregnancy G2P1001 at 2769w0d with an Estimated Date of Delivery: 12/19/17   2) ?Poly, per pt report from u/s at [facility]> get records, we will get efw/afi u/s (per front office, 1st available u/s here not for 2wks, will get scheduled w/ mfm)  3) Preterm uc's> po hydration, discussed ptl s/s, reasons to seek care   Meds: No orders of the defined types were placed in this encounter.  Labs/procedures today: none  Plan:  Continue routine obstetrical care   Reviewed: Preterm labor symptoms and general obstetric precautions including but not limited to vaginal bleeding, contractions, leaking of fluid and fetal movement were reviewed in detail with the patient.  All questions were answered  Follow-up: Return for asap for efw/afi u/s (no visit), get records from [facility]; then 2wks for LROB.  Orders Placed This Encounter  Procedures  . US MFM OB FOLLOW UP  . POC Urinalysis Dipstick OB   Cheral MarkerKimberly R Booker CNM, [facility]-BC 10/10/2017 1:59 PM

## 2017-10-10 NOTE — Patient Instructions (Signed)
Jessica Poole, I greatly value your feedback.  If you receive a survey following your visit with us today, we appreciate you taking the time to fill it out.  Thanks, [name], CNM, WHNP-BC   Call the office [phone]) or go to [facility] if:  You begin to have strong, frequent contractions  Your water breaks.  Sometimes it is a big gush of fluid, sometimes it is just a trickle that keeps getting your panties wet or running down your legs  You have vaginal bleeding.  It is normal to have a small amount of spotting if your cervix was checked.   You don't feel your baby moving like normal.  If you don't, get you something to eat and drink and lay down and focus on feeling your baby move.  You should feel at least 10 movements in 2 hours.  If you don't, you should call the office or go to [facility].    Tdap Vaccine  It is recommended that you get the Tdap vaccine during the third trimester of EACH pregnancy to help protect your baby from getting pertussis (whooping cough)  27-36 weeks is the BEST time to do this so that you can pass the protection on to your baby. During pregnancy is better than after pregnancy, but if you are unable to get it during pregnancy it will be offered at the hospital.   You can get this vaccine with us, at the health department, your family doctor, or some local pharmacies  Everyone who will be around your baby should also be up-to-date on their vaccines before the baby comes. Adults (who are not pregnant) only need 1 dose of Tdap during adulthood.   Third Trimester of Pregnancy The third trimester is from week 29 through week 42, months 7 through 9. The third trimester is a time when the fetus is growing rapidly. At the end of the ninth month, the fetus is about 20 inches in length and weighs 6-10 pounds.  BODY CHANGES Your body goes through many changes during pregnancy. The changes vary from woman to woman.   Your weight will continue to  increase. You can expect to gain 25-35 pounds (11-16 kg) by the end of the pregnancy.  You may begin to get stretch marks on your hips, abdomen, and breasts.  You may urinate more often because the fetus is moving lower into your pelvis and pressing on your bladder.  You may develop or continue to have heartburn as a result of your pregnancy.  You may develop constipation because certain hormones are causing the muscles that push waste through your intestines to slow down.  You may develop hemorrhoids or swollen, bulging veins (varicose veins).  You may have pelvic pain because of the weight gain and pregnancy hormones relaxing your joints between the bones in your pelvis. Backaches may result from overexertion of the muscles supporting your posture.  You may have changes in your hair. These can include thickening of your hair, rapid growth, and changes in texture. Some women also have hair loss during or after pregnancy, or hair that feels dry or thin. Your hair will most likely return to normal after your baby is born.  Your breasts will continue to grow and be tender. A yellow discharge may leak from your breasts called colostrum.  Your belly button may stick out.  You may feel short of breath because of your expanding uterus.  You may notice the fetus "dropping," or moving lower  in your abdomen.  You may have a bloody mucus discharge. This usually occurs a few days to a week before labor begins.  Your cervix becomes thin and soft (effaced) near your due date. WHAT TO EXPECT AT YOUR PRENATAL EXAMS  You will have prenatal exams every 2 weeks until week 36. Then, you will have weekly prenatal exams. During a routine prenatal visit:  You will be weighed to make sure you and the fetus are growing normally.  Your blood pressure is taken.  Your abdomen will be measured to track your baby's growth.  The fetal heartbeat will be listened to.  Any test results from the previous visit  will be discussed.  You may have a cervical check near your due date to see if you have effaced. At around 36 weeks, your caregiver will check your cervix. At the same time, your caregiver will also perform a test on the secretions of the vaginal tissue. This test is to determine if a type of bacteria, Group B streptococcus, is present. Your caregiver will explain this further. Your caregiver may ask you:  What your birth plan is.  How you are feeling.  If you are feeling the baby move.  If you have had any abnormal symptoms, such as leaking fluid, bleeding, severe headaches, or abdominal cramping.  If you have any questions. Other tests or screenings that may be performed during your third trimester include:  Blood tests that check for low iron levels (anemia).  Fetal testing to check the health, activity level, and growth of the fetus. Testing is done if you have certain medical conditions or if there are problems during the pregnancy. FALSE LABOR You may feel small, irregular contractions that eventually go away. These are called Braxton Hicks contractions, or false labor. Contractions may last for hours, days, or even weeks before true labor sets in. If contractions come at regular intervals, intensify, or become painful, it is best to be seen by your caregiver.  SIGNS OF LABOR   Menstrual-like cramps.  Contractions that are 5 minutes apart or less.  Contractions that start on the top of the uterus and spread down to the lower abdomen and back.  A sense of increased pelvic pressure or back pain.  A watery or bloody mucus discharge that comes from the vagina. If you have any of these signs before the 37th week of pregnancy, call your caregiver right away. You need to go to the hospital to get checked immediately. HOME CARE INSTRUCTIONS   Avoid all smoking, herbs, alcohol, and unprescribed drugs. These chemicals affect the formation and growth of the baby.  Follow your  caregiver's instructions regarding medicine use. There are medicines that are either safe or unsafe to take during pregnancy.  Exercise only as directed by your caregiver. Experiencing uterine cramps is a good sign to stop exercising.  Continue to eat regular, healthy meals.  Wear a good support bra for breast tenderness.  Do not use hot tubs, steam rooms, or saunas.  Wear your seat belt at all times when driving.  Avoid raw meat, uncooked cheese, cat litter boxes, and soil used by cats. These carry germs that can cause birth defects in the baby.  Take your prenatal vitamins.  Try taking a stool softener (if your caregiver approves) if you develop constipation. Eat more high-fiber foods, such as fresh vegetables or fruit and whole grains. Drink plenty of fluids to keep your urine clear or pale yellow.  Take warm sitz baths   to soothe any pain or discomfort caused by hemorrhoids. Use hemorrhoid cream if your caregiver approves.  If you develop varicose veins, wear support hose. Elevate your feet for 15 minutes, 3-4 times a day. Limit salt in your diet.  Avoid heavy lifting, wear low heal shoes, and practice good posture.  Rest a lot with your legs elevated if you have leg cramps or low back pain.  Visit your dentist if you have not gone during your pregnancy. Use a soft toothbrush to brush your teeth and be gentle when you floss.  A sexual relationship may be continued unless your caregiver directs you otherwise.  Do not travel far distances unless it is absolutely necessary and only with the approval of your caregiver.  Take prenatal classes to understand, practice, and ask questions about the labor and delivery.  Make a trial run to the hospital.  Pack your hospital bag.  Prepare the baby's nursery.  Continue to go to all your prenatal visits as directed by your caregiver. SEEK MEDICAL CARE IF:  You are unsure if you are in labor or if your water has broken.  You have  dizziness.  You have mild pelvic cramps, pelvic pressure, or nagging pain in your abdominal area.  You have persistent nausea, vomiting, or diarrhea.  You have a bad smelling vaginal discharge.  You have pain with urination. SEEK IMMEDIATE MEDICAL CARE IF:   You have a fever.  You are leaking fluid from your vagina.  You have spotting or bleeding from your vagina.  You have severe abdominal cramping or pain.  You have rapid weight loss or gain.  You have shortness of breath with chest pain.  You notice sudden or extreme swelling of your face, hands, ankles, feet, or legs.  You have not felt your baby move in over an hour.  You have severe headaches that do not go away with medicine.  You have vision changes. Document Released: 01/26/2001 Document Revised: 02/06/2013 Document Reviewed: 04/04/2012 [facility] Patient Information 2015 [place], [place]. This information is not intended to replace advice given to you by your health care provider. Make sure you discuss any questions you have with your health care provider.

## 2017-10-12 ENCOUNTER — Ambulatory Visit (INDEPENDENT_AMBULATORY_CARE_PROVIDER_SITE_OTHER): Payer: 59

## 2017-10-12 ENCOUNTER — Other Ambulatory Visit: Payer: Self-pay | Admitting: Women's Health

## 2017-10-12 DIAGNOSIS — O403XX0 Polyhydramnios, third trimester, not applicable or unspecified: Secondary | ICD-10-CM | POA: Diagnosis not present

## 2017-10-12 DIAGNOSIS — Z3483 Encounter for supervision of other normal pregnancy, third trimester: Secondary | ICD-10-CM

## 2017-10-12 NOTE — Progress Notes (Signed)
US 30+2 wks,cephalic,cx 3.4 cm,anterior placenta gr 1,bilat adnexa's wnl,polyhydramnios AFI 33.5 cm,BPP 6/8 no breathing,enlarged stomach w/double bubble sign,fhr 148 bpm,EFW 1593 g 46%

## 2017-10-13 ENCOUNTER — Other Ambulatory Visit (HOSPITAL_COMMUNITY): Payer: Self-pay

## 2017-10-13 ENCOUNTER — Telehealth: Payer: Self-pay | Admitting: Obstetrics and Gynecology

## 2017-10-13 ENCOUNTER — Other Ambulatory Visit (HOSPITAL_COMMUNITY): Payer: Self-pay | Admitting: *Deleted

## 2017-10-13 ENCOUNTER — Ambulatory Visit (HOSPITAL_COMMUNITY)
Admission: RE | Admit: 2017-10-13 | Discharge: 2017-10-13 | Disposition: A | Discharge status: Home / Self Care | Payer: 59 | Source: Ambulatory Visit | Attending: Obstetrics and Gynecology | Admitting: Obstetrics and Gynecology

## 2017-10-13 ENCOUNTER — Other Ambulatory Visit: Payer: Self-pay | Admitting: Obstetrics and Gynecology

## 2017-10-13 ENCOUNTER — Encounter (HOSPITAL_COMMUNITY): Payer: Self-pay

## 2017-10-13 ENCOUNTER — Encounter: Payer: Self-pay | Admitting: Women's Health

## 2017-10-13 DIAGNOSIS — O359XX0 Maternal care for (suspected) fetal abnormality and damage, unspecified, not applicable or unspecified: Secondary | ICD-10-CM

## 2017-10-13 DIAGNOSIS — O4703 False labor before 37 completed weeks of gestation, third trimester: Secondary | ICD-10-CM

## 2017-10-13 DIAGNOSIS — O283 Abnormal ultrasonic finding on antenatal screening of mother: Secondary | ICD-10-CM | POA: Diagnosis present

## 2017-10-13 DIAGNOSIS — Z3483 Encounter for supervision of other normal pregnancy, third trimester: Secondary | ICD-10-CM

## 2017-10-13 DIAGNOSIS — O403XX0 Polyhydramnios, third trimester, not applicable or unspecified: Secondary | ICD-10-CM

## 2017-10-13 DIAGNOSIS — O34219 Maternal care for unspecified type scar from previous cesarean delivery: Secondary | ICD-10-CM | POA: Diagnosis not present

## 2017-10-13 DIAGNOSIS — O409XX0 Polyhydramnios, unspecified trimester, not applicable or unspecified: Secondary | ICD-10-CM | POA: Insufficient documentation

## 2017-10-13 DIAGNOSIS — O358XX0 Maternal care for other (suspected) fetal abnormality and damage, not applicable or unspecified: Secondary | ICD-10-CM | POA: Insufficient documentation

## 2017-10-13 DIAGNOSIS — Z363 Encounter for antenatal screening for malformations: Secondary | ICD-10-CM | POA: Diagnosis not present

## 2017-10-13 DIAGNOSIS — O289 Unspecified abnormal findings on antenatal screening of mother: Secondary | ICD-10-CM | POA: Diagnosis not present

## 2017-10-13 DIAGNOSIS — Z3A30 30 weeks gestation of pregnancy: Secondary | ICD-10-CM | POA: Diagnosis not present

## 2017-10-13 DIAGNOSIS — O35DXX0 Maternal care for other (suspected) fetal abnormality and damage, fetal gastrointestinal anomalies, not applicable or unspecified: Secondary | ICD-10-CM

## 2017-10-13 NOTE — Progress Notes (Signed)
Pt called , she continues to have regular contractions q 5 minutes with out discharge or bleeding. Contractions resolve when pt lies down.    Case discussed with Dr Noralee Spaceavi Shankar, MFM, who will see pt and evaluate for possible amnioreduction., and detailed u/s.  Orders for consult and MFM detail u/s to be placed.

## 2017-10-13 NOTE — Telephone Encounter (Signed)
[facility].

## 2017-10-13 NOTE — Procedures (Signed)
Jessica Poole 07/08/1995 6549w3d  Fetus A Non-Stress Test Interpretation for 10/13/17  Indication: Polyhydramnios  Fetal Heart Rate A Mode: External Baseline Rate (A): 135 bpm Variability: Moderate Accelerations: 15 x 15 Decelerations: None Multiple birth?: No  Uterine Activity Mode: Toco Contraction Frequency (min): Occ UC noted Contraction Duration (sec): 40-90 Contraction Quality: Mild Resting Tone Palpated: Relaxed Resting Time: Adequate  Interpretation (Fetal Testing) Nonstress Test Interpretation: Reactive Comments: FHR tracing rev'd by Dr. Judeth CornfieldShankar

## 2017-10-14 ENCOUNTER — Other Ambulatory Visit (HOSPITAL_COMMUNITY): Payer: Self-pay | Admitting: *Deleted

## 2017-10-14 ENCOUNTER — Ambulatory Visit (INDEPENDENT_AMBULATORY_CARE_PROVIDER_SITE_OTHER): Payer: 59 | Admitting: Women's Health

## 2017-10-14 ENCOUNTER — Encounter: Payer: Self-pay | Admitting: Women's Health

## 2017-10-14 VITALS — BP 127/95 | HR 106 | Wt 132.0 lb

## 2017-10-14 DIAGNOSIS — O4700 False labor before 37 completed weeks of gestation, unspecified trimester: Secondary | ICD-10-CM

## 2017-10-14 DIAGNOSIS — O0990 Supervision of high risk pregnancy, unspecified, unspecified trimester: Secondary | ICD-10-CM

## 2017-10-14 DIAGNOSIS — O358XX0 Maternal care for other (suspected) fetal abnormality and damage, not applicable or unspecified: Secondary | ICD-10-CM

## 2017-10-14 DIAGNOSIS — Z1389 Encounter for screening for other disorder: Secondary | ICD-10-CM

## 2017-10-14 DIAGNOSIS — O479 False labor, unspecified: Secondary | ICD-10-CM | POA: Diagnosis not present

## 2017-10-14 DIAGNOSIS — O35DXX0 Maternal care for other (suspected) fetal abnormality and damage, fetal gastrointestinal anomalies, not applicable or unspecified: Secondary | ICD-10-CM

## 2017-10-14 DIAGNOSIS — O359XX0 Maternal care for (suspected) fetal abnormality and damage, unspecified, not applicable or unspecified: Secondary | ICD-10-CM

## 2017-10-14 DIAGNOSIS — Z3A30 30 weeks gestation of pregnancy: Secondary | ICD-10-CM

## 2017-10-14 DIAGNOSIS — O0993 Supervision of high risk pregnancy, unspecified, third trimester: Secondary | ICD-10-CM

## 2017-10-14 DIAGNOSIS — Z331 Pregnant state, incidental: Secondary | ICD-10-CM

## 2017-10-14 LAB — POCT URINALYSIS DIPSTICK OB
GLUCOSE, UA: NEGATIVE
Ketones, UA: NEGATIVE
LEUKOCYTES UA: NEGATIVE
NITRITE UA: NEGATIVE
PROTEIN: NEGATIVE
RBC UA: NEGATIVE

## 2017-10-14 MED ORDER — NIFEDIPINE ER OSMOTIC RELEASE 30 MG PO TB24: 30.0000 mg | ORAL_TABLET | Freq: Two times a day (BID) | ORAL | 0 refills | Status: DC | PRN

## 2017-10-14 NOTE — Patient Instructions (Signed)
Mariea ClontsSamantha B Bulkley, I greatly value your feedback.  If you receive a survey following your visit with us today, we appreciate you taking the time to fill it out.  Thanks, Joellyn HaffKim Zamaya Rapaport, CNM, WHNP-BC   Call the office [phone]) or go to [facility] if:  You begin to have strong, frequent contractions  Your water breaks.  Sometimes it is a big gush of fluid, sometimes it is just a trickle that keeps getting your panties wet or running down your legs  You have vaginal bleeding.  It is normal to have a small amount of spotting if your cervix was checked.   You don't feel your baby moving like normal.  If you don't, get you something to eat and drink and lay down and focus on feeling your baby move.  You should feel at least 10 movements in 2 hours.  If you don't, you should call the office or go to [facility].    Tdap Vaccine  It is recommended that you get the Tdap vaccine during the third trimester of EACH pregnancy to help protect your baby from getting pertussis (whooping cough)  27-36 weeks is the BEST time to do this so that you can pass the protection on to your baby. During pregnancy is better than after pregnancy, but if you are unable to get it during pregnancy it will be offered at the hospital.   You can get this vaccine with us, at the health department, your family doctor, or some local pharmacies  Everyone who will be around your baby should also be up-to-date on their vaccines before the baby comes. Adults (who are not pregnant) only need 1 dose of Tdap during adulthood.   Third Trimester of Pregnancy The third trimester is from week 29 through week 42, months 7 through 9. The third trimester is a time when the fetus is growing rapidly. At the end of the ninth month, the fetus is about 20 inches in length and weighs 6-10 pounds.  BODY CHANGES Your body goes through many changes during pregnancy. The changes vary from woman to woman.   Your weight will continue to  increase. You can expect to gain 25-35 pounds (11-16 kg) by the end of the pregnancy.  You may begin to get stretch marks on your hips, abdomen, and breasts.  You may urinate more often because the fetus is moving lower into your pelvis and pressing on your bladder.  You may develop or continue to have heartburn as a result of your pregnancy.  You may develop constipation because certain hormones are causing the muscles that push waste through your intestines to slow down.  You may develop hemorrhoids or swollen, bulging veins (varicose veins).  You may have pelvic pain because of the weight gain and pregnancy hormones relaxing your joints between the bones in your pelvis. Backaches may result from overexertion of the muscles supporting your posture.  You may have changes in your hair. These can include thickening of your hair, rapid growth, and changes in texture. Some women also have hair loss during or after pregnancy, or hair that feels dry or thin. Your hair will most likely return to normal after your baby is born.  Your breasts will continue to grow and be tender. A yellow discharge may leak from your breasts called colostrum.  Your belly button may stick out.  You may feel short of breath because of your expanding uterus.  You may notice the fetus "dropping," or moving lower  in your abdomen.  You may have a bloody mucus discharge. This usually occurs a few days to a week before labor begins.  Your cervix becomes thin and soft (effaced) near your due date. WHAT TO EXPECT AT YOUR PRENATAL EXAMS  You will have prenatal exams every 2 weeks until week 36. Then, you will have weekly prenatal exams. During a routine prenatal visit:  You will be weighed to make sure you and the fetus are growing normally.  Your blood pressure is taken.  Your abdomen will be measured to track your baby's growth.  The fetal heartbeat will be listened to.  Any test results from the previous visit  will be discussed.  You may have a cervical check near your due date to see if you have effaced. At around 36 weeks, your caregiver will check your cervix. At the same time, your caregiver will also perform a test on the secretions of the vaginal tissue. This test is to determine if a type of bacteria, Group B streptococcus, is present. Your caregiver will explain this further. Your caregiver may ask you:  What your birth plan is.  How you are feeling.  If you are feeling the baby move.  If you have had any abnormal symptoms, such as leaking fluid, bleeding, severe headaches, or abdominal cramping.  If you have any questions. Other tests or screenings that may be performed during your third trimester include:  Blood tests that check for low iron levels (anemia).  Fetal testing to check the health, activity level, and growth of the fetus. Testing is done if you have certain medical conditions or if there are problems during the pregnancy. FALSE LABOR You may feel small, irregular contractions that eventually go away. These are called Braxton Hicks contractions, or false labor. Contractions may last for hours, days, or even weeks before true labor sets in. If contractions come at regular intervals, intensify, or become painful, it is best to be seen by your caregiver.  SIGNS OF LABOR   Menstrual-like cramps.  Contractions that are 5 minutes apart or less.  Contractions that start on the top of the uterus and spread down to the lower abdomen and back.  A sense of increased pelvic pressure or back pain.  A watery or bloody mucus discharge that comes from the vagina. If you have any of these signs before the 37th week of pregnancy, call your caregiver right away. You need to go to the hospital to get checked immediately. HOME CARE INSTRUCTIONS   Avoid all smoking, herbs, alcohol, and unprescribed drugs. These chemicals affect the formation and growth of the baby.  Follow your  caregiver's instructions regarding medicine use. There are medicines that are either safe or unsafe to take during pregnancy.  Exercise only as directed by your caregiver. Experiencing uterine cramps is a good sign to stop exercising.  Continue to eat regular, healthy meals.  Wear a good support bra for breast tenderness.  Do not use hot tubs, steam rooms, or saunas.  Wear your seat belt at all times when driving.  Avoid raw meat, uncooked cheese, cat litter boxes, and soil used by cats. These carry germs that can cause birth defects in the baby.  Take your prenatal vitamins.  Try taking a stool softener (if your caregiver approves) if you develop constipation. Eat more high-fiber foods, such as fresh vegetables or fruit and whole grains. Drink plenty of fluids to keep your urine clear or pale yellow.  Take warm sitz baths   to soothe any pain or discomfort caused by hemorrhoids. Use hemorrhoid cream if your caregiver approves.  If you develop varicose veins, wear support hose. Elevate your feet for 15 minutes, 3-4 times a day. Limit salt in your diet.  Avoid heavy lifting, wear low heal shoes, and practice good posture.  Rest a lot with your legs elevated if you have leg cramps or low back pain.  Visit your dentist if you have not gone during your pregnancy. Use a soft toothbrush to brush your teeth and be gentle when you floss.  A sexual relationship may be continued unless your caregiver directs you otherwise.  Do not travel far distances unless it is absolutely necessary and only with the approval of your caregiver.  Take prenatal classes to understand, practice, and ask questions about the labor and delivery.  Make a trial run to the hospital.  Pack your hospital bag.  Prepare the baby's nursery.  Continue to go to all your prenatal visits as directed by your caregiver. SEEK MEDICAL CARE IF:  You are unsure if you are in labor or if your water has broken.  You have  dizziness.  You have mild pelvic cramps, pelvic pressure, or nagging pain in your abdominal area.  You have persistent nausea, vomiting, or diarrhea.  You have a bad smelling vaginal discharge.  You have pain with urination. SEEK IMMEDIATE MEDICAL CARE IF:   You have a fever.  You are leaking fluid from your vagina.  You have spotting or bleeding from your vagina.  You have severe abdominal cramping or pain.  You have rapid weight loss or gain.  You have shortness of breath with chest pain.  You notice sudden or extreme swelling of your face, hands, ankles, feet, or legs.  You have not felt your baby move in over an hour.  You have severe headaches that do not go away with medicine.  You have vision changes. Document Released: 01/26/2001 Document Revised: 02/06/2013 Document Reviewed: 04/04/2012 [facility] Patient Information 2015 [place] [place]. This information is not intended to replace advice given to you by your health care provider. Make sure you discuss any questions you have with your health care provider.   Preterm Labor and Birth Information The normal length of a pregnancy is 39-41 weeks. Preterm labor is when labor starts before 37 completed weeks of pregnancy. What are the risk factors for preterm labor? Preterm labor is more likely to occur in women who:  Have certain infections during pregnancy such as a bladder infection, sexually transmitted infection, or infection inside the uterus (chorioamnionitis).  Have a shorter-than-normal cervix.  Have gone into preterm labor before.  Have had surgery on their cervix.  Are younger than age 22 or older than age 22.  Are African American.  Are pregnant with twins or multiple babies (multiple gestation).  Take street drugs or smoke while pregnant.  Do not gain enough weight while pregnant.  Became pregnant shortly after having been pregnant.  What are the symptoms of preterm labor? Symptoms of preterm  labor include:  Cramps similar to those that can happen during a menstrual period. The cramps may happen with diarrhea.  Pain in the abdomen or lower back.  Regular uterine contractions that may feel like tightening of the abdomen.  A feeling of increased pressure in the pelvis.  Increased watery or bloody mucus discharge from the vagina.  Water breaking (ruptured amniotic sac).  Why is it important to recognize signs of preterm labor? It is  important to recognize signs of preterm labor because babies who are born prematurely may not be fully developed. This can put them at an increased risk for:  Long-term (chronic) heart and lung problems.  Difficulty immediately after birth with regulating body systems, including blood sugar, body temperature, heart rate, and breathing rate.  Bleeding in the brain.  Cerebral palsy.  Learning difficulties.  Death.  These risks are highest for babies who are born before 34 weeks of pregnancy. How is preterm labor treated? Treatment depends on the length of your pregnancy, your condition, and the health of your baby. It may involve:  Having a stitch (suture) placed in your cervix to prevent your cervix from opening too early (cerclage).  Taking or being given medicines, such as: ? Hormone medicines. These may be given early in pregnancy to help support the pregnancy. ? Medicine to stop contractions. ? Medicines to help mature the baby's lungs. These may be prescribed if the risk of delivery is high. ? Medicines to prevent your baby from developing cerebral palsy.  If the labor happens before 34 weeks of pregnancy, you may need to stay in the hospital. What should I do if I think I am in preterm labor? If you think that you are going into preterm labor, call your health care provider right away. How can I prevent preterm labor in future pregnancies? To increase your chance of having a full-term pregnancy:  Do not use any tobacco  products, such as cigarettes, chewing tobacco, and e-cigarettes. If you need help quitting, ask your health care provider.  Do not use street drugs or medicines that have not been prescribed to you during your pregnancy.  Talk with your health care provider before taking any herbal supplements, even if you have been taking them regularly.  Make sure you gain a healthy amount of weight during your pregnancy.  Watch for infection. If you think that you might have an infection, get it checked right away.  Make sure to tell your health care provider if you have gone into preterm labor before.  This information is not intended to replace advice given to you by your health care provider. Make sure you discuss any questions you have with your health care provider. Document Released: 04/24/2003 Document Revised: 07/15/2015 Document Reviewed: 06/25/2015 Elsevier Interactive Patient Education  2018 [employer].

## 2017-10-14 NOTE — Progress Notes (Signed)
HIGH-RISK PREGNANCY VISIT Patient name: Jessica ClontsSamantha B Jaspers MRN [id number]  Date of birth: 03/06/1995 Chief Complaint:   High Risk Gestation (NST; discuss preterm labor)  History of Present Illness:   Jessica Poole is a 22 y.o. 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  Today she reports saw MFM yesterday who confirmed dx, AFI was 41cm there, had amniocentesis w/ removal of 33ml- declined further amnioreduction. Cx was 2.8cm. BP slightly elevated today, Denies ha, visual changes, ruq/epigastric pain, n/v.   Contractions: Irregular. Vag. Bleeding: None.  Movement: Present. denies leaking of fluid.  Review of Systems:   Pertinent items are noted in HPI Denies abnormal vaginal discharge w/ itching/odor/irritation, headaches, visual changes, shortness of breath, chest pain, abdominal pain, severe nausea/vomiting, or problems with urination or bowel movements unless otherwise stated above. Pertinent History Reviewed:  Reviewed past medical,surgical, social, obstetrical and family history.  Reviewed problem list, medications and allergies. Physical Assessment:   Vitals:   10/14/17 1049  BP: (!) 127/95  Pulse: (!) 106  Weight: 132 lb (59.9 kg)  Body mass index is 25.78 kg/m.           Physical Examination:   General appearance: alert, well appearing, and in no distress  Mental status: alert, oriented to person, place, and time  Skin: warm & dry   Extremities: Edema: Trace    Cardiovascular: normal heart rate noted  Respiratory: normal respiratory effort, no distress  Abdomen: gravid, soft, non-tender  Pelvic: spec exam: cx visually closed, fFN collected, SVE: cl/20/-2 so fFN not sent         Fetal Status: Fetal Heart Rate (bpm): 135 Fundal Height: 33 cm Movement: Present    Fetal Surveillance Testing today: NST d/t UC's: FHR baseline 135  bpm, Variability: moderate, Accelerations:present, Decelerations:  Absent= Cat 1/Reactive Toco: irregular     Results for orders placed or performed in visit on 10/14/17 (from the past 24 hour(s))  POC Urinalysis Dipstick OB   Collection Time: 10/14/17 10:50 AM  Result Value Ref Range   Color, UA     Clarity, UA     Glucose, UA Negative Negative   Bilirubin, UA     Ketones, UA neg    Spec Grav, UA     Blood, UA neg    pH, UA     POC Protein UA Negative Negative, Trace   Urobilinogen, UA     Nitrite, UA neg    Leukocytes, UA Negative Negative   Appearance     Odor      Assessment & Plan:  1) High-risk pregnancy G2P1001 at 6631w4d with an Estimated Date of Delivery: 12/19/17   2) Duodenal atresia w/ polyhdramnios, saw MFM yesterday, needs delivery at tertiary facility, goes back to MFM in 2wks for repeat u/s. Had amniocentesis yesterday  3) Prev c/s, desires TOLAC  4) Preterm uc's> discussed w/ JVF, rx procardia 30 daily to BID. Discussed ptl s/s, reasons to seek care  5) Mildly elevated bp today> asymptomatic, no proteinuria, return Monday for bp check w/ nurse  Meds:  Meds ordered this encounter  Medications  . NIFEdipine (PROCARDIA XL) 30 MG 24 hr tablet    Sig: Take 1 tablet (30 mg total) by mouth 2 (two) times daily as needed (contractions).    Dispense:  60 tablet    Refill:  0    Order Specific  Question:   Supervising Provider    Answer:   Lazaro Arms [[id number]]    Labs/procedures today: nst, spec exam, sve  Treatment Plan:  Per MFM- repeat u/s in 2wks w/ them, then weekly BPP per Dr. Zannie Kehr note  Reviewed: Preterm labor symptoms and general obstetric precautions including but not limited to vaginal bleeding, contractions, leaking of fluid and fetal movement were reviewed in detail with the patient.  All questions were answered.  Follow-up: Return for Monday for bp check w/ nurse, then 2wks for HROB.  Orders Placed This Encounter  Procedures  . POC  Urinalysis Dipstick OB   Cheral Marker CNM, [facility] 10/14/2017 3:19 PM

## 2017-10-15 ENCOUNTER — Other Ambulatory Visit (HOSPITAL_COMMUNITY): Payer: Self-pay

## 2017-10-15 ENCOUNTER — Other Ambulatory Visit: Payer: Self-pay

## 2017-10-18 ENCOUNTER — Ambulatory Visit (INDEPENDENT_AMBULATORY_CARE_PROVIDER_SITE_OTHER): Payer: 59

## 2017-10-18 ENCOUNTER — Telehealth (HOSPITAL_COMMUNITY): Payer: Self-pay | Admitting: *Deleted

## 2017-10-18 VITALS — BP 109/80 | HR 93 | Ht 60.0 in | Wt 132.8 lb

## 2017-10-18 DIAGNOSIS — Z0130 Encounter for examination of blood pressure without abnormal findings: Secondary | ICD-10-CM

## 2017-10-18 DIAGNOSIS — Z1389 Encounter for screening for other disorder: Secondary | ICD-10-CM

## 2017-10-18 DIAGNOSIS — Z331 Pregnant state, incidental: Secondary | ICD-10-CM

## 2017-10-18 LAB — POCT URINALYSIS DIPSTICK OB
Glucose, UA: NEGATIVE
KETONES UA: NEGATIVE
Leukocytes, UA: NEGATIVE
NITRITE UA: NEGATIVE
POC,PROTEIN,UA: NEGATIVE
RBC UA: NEGATIVE

## 2017-10-18 NOTE — Progress Notes (Signed)
Pt here for blood pressure check 109/80 pulse 93. Spoke kim booker about result. Stated B/P look good. Keep regular appointment.Pad CMA

## 2017-10-18 NOTE — Telephone Encounter (Signed)
Pt called requesting FISH result from amnio.  Name and DOB verified, normal female by [facility] given.  Pt voiced understanding.

## 2017-10-18 NOTE — Addendum Note (Signed)
Addended by: Federico Flake A on: 10/18/2017 11:02 AM   Modules accepted: Orders

## 2017-10-20 NOTE — Progress Notes (Signed)
[facility] Pediatric General Surgery    Thank you for th referral of Ms. Esquer.  As you know, she is a 22 y.o. G2P1001 woman who is carrying a fetus at approximately [redacted] weeks gestation with duodenal atresia/stenosis. I appreciate the chance to see her in prenatal consultation, and we reviewed pertinent surgical issues in reference to prenatal, perinatal, and postnatal issues regarding this fetus.  As you know, Ms. Gates is in otherwise good health. There is no family history of any congenital anomaly. She has had an uncomplicated pregnancy to date and remains on prenatal vitamins and nifedipine. Her social history is as follows:   reports that she has never smoked. She has never used smokeless tobacco. She reports that she drank alcohol. She reports that she does not use drugs..  She underwent a fetal ultrasound at 30 weeks demonstrating a "double bubble" in the abdomen consistent with an antenatal diagnosis of duodenal atresia.  Mother refused to obtain a fetal echocardiogram.  In terms of prenatal issues, I reviewed the etiology of duodenal atresia. I reviewed the potential risk for congenital heart disease and other associated anomalies and genetic disorders. At this stage in her gestation, there are no other options for prenatal therapies and I have encouraged her to keep up with her scheduled ultrasounds.  In terms of perinatal issues, I understand Ms. Bouchard may deliver by natural birth. I informed her that approximately 50% of neonates with duodenal atresia are born prematurely. I will defer all issues regarding perinatal management to your expertise.  Finally, in terms of postnatal issues, I will be able to assist with evaluation and care of this baby in consultation with the neonatology staff after birth. The baby will be transferred to the NICU and will undergo a full assortment of supportive measures. The baby will undergo a postnatal echocardiogram to evaluate  for cardiac defects. As I discussed with mother, babies with duodenal atresia/stenosis have a 30% risk of cardiac defects. If a cardiac defect is found in this baby, he will require transfer to a children's center with anesthesiologists comfortable with anesthetizing infants with cardiac defects, and with pediatric cardiac surgery available. The baby may also undergo a chromosomal analysis. We reviewed surgical repair of the duodenal atresia along with risks of the procedure. Repair will depend on the size of the neonate and the condition of the heart. We also reviewed the post-operative course. Finally, we reviewed survival statistics and the comorbidities associated with this defect.  I think Ms. Bowley found this session quite helpful.  It has been a pleasure performing this consultation.  Please do not hesitate to call our office with questions or concerns.  Greater than 50% of the 45 minute visit was spent in counseling/coordination of care regarding prenatal diagnosis of duodenal atresia/stenosis.  Kandice Hams, MD

## 2017-10-21 ENCOUNTER — Ambulatory Visit (INDEPENDENT_AMBULATORY_CARE_PROVIDER_SITE_OTHER): Payer: 59 | Admitting: Surgery

## 2017-10-21 ENCOUNTER — Encounter (INDEPENDENT_AMBULATORY_CARE_PROVIDER_SITE_OTHER): Payer: Self-pay | Admitting: Surgery

## 2017-10-21 DIAGNOSIS — O358XX0 Maternal care for other (suspected) fetal abnormality and damage, not applicable or unspecified: Secondary | ICD-10-CM

## 2017-10-21 DIAGNOSIS — O35DXX0 Maternal care for other (suspected) fetal abnormality and damage, fetal gastrointestinal anomalies, not applicable or unspecified: Secondary | ICD-10-CM

## 2017-10-24 ENCOUNTER — Encounter: Payer: Self-pay | Admitting: Obstetrics and Gynecology

## 2017-10-24 ENCOUNTER — Telehealth: Payer: Self-pay | Admitting: Obstetrics and Gynecology

## 2017-10-24 ENCOUNTER — Ambulatory Visit (INDEPENDENT_AMBULATORY_CARE_PROVIDER_SITE_OTHER): Payer: 59 | Admitting: Obstetrics and Gynecology

## 2017-10-24 VITALS — BP 126/94 | HR 115 | Wt 135.6 lb

## 2017-10-24 DIAGNOSIS — N898 Other specified noninflammatory disorders of vagina: Secondary | ICD-10-CM | POA: Diagnosis not present

## 2017-10-24 DIAGNOSIS — O26893 Other specified pregnancy related conditions, third trimester: Secondary | ICD-10-CM | POA: Diagnosis not present

## 2017-10-24 DIAGNOSIS — Z3A32 32 weeks gestation of pregnancy: Secondary | ICD-10-CM

## 2017-10-24 DIAGNOSIS — O0990 Supervision of high risk pregnancy, unspecified, unspecified trimester: Secondary | ICD-10-CM

## 2017-10-24 DIAGNOSIS — O26892 Other specified pregnancy related conditions, second trimester: Secondary | ICD-10-CM

## 2017-10-24 DIAGNOSIS — O403XX0 Polyhydramnios, third trimester, not applicable or unspecified: Secondary | ICD-10-CM

## 2017-10-24 DIAGNOSIS — Z331 Pregnant state, incidental: Secondary | ICD-10-CM

## 2017-10-24 DIAGNOSIS — Z1389 Encounter for screening for other disorder: Secondary | ICD-10-CM

## 2017-10-24 DIAGNOSIS — Q410 Congenital absence, atresia and stenosis of duodenum: Secondary | ICD-10-CM

## 2017-10-24 LAB — POCT URINALYSIS DIPSTICK OB
Glucose, UA: NEGATIVE
LEUKOCYTES UA: NEGATIVE
NITRITE UA: NEGATIVE
POC,PROTEIN,UA: NEGATIVE
RBC UA: NEGATIVE

## 2017-10-24 LAB — POCT WET PREP WITH KOH
CLUE CELLS WET PREP PER HPF POC: NEGATIVE
TRICHOMONAS UA: NEGATIVE
YEAST WET PREP PER HPF POC: NEGATIVE

## 2017-10-24 MED ORDER — NIFEDIPINE ER OSMOTIC RELEASE 30 MG PO TB24: 30.0000 mg | ORAL_TABLET | Freq: Every day | ORAL | 1 refills | Status: DC

## 2017-10-24 NOTE — Progress Notes (Signed)
Patient ID: ARIANI SEIER, female   DOB: 1995-09-04, 22 y.o.   MRN: [id number]    [facility] PREGNANCY VISIT Patient name: LASSIE DEMOREST MRN [id number]  Date of birth: 02/09/1996 Chief Complaint:   High Risk Gestation (+ pressure, cramps and back pain; increase in discharge)  History of Present Illness:   LAMIA MARINER is a 22 y.o. 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 She chose not like to go through procedure of removing fluid around baby. Procedure of genetic amniocentesis "was very very very uncomfortable". SHaydanand husband met the pediatric surgeon and was told that they were going to look at baby's heart once he is born since baby will be born pre-mature. She woke up having a lot of pressure and contractions. When she stands she feels that the baby is going to fall out. She had sexual intercourse less than 24 hours ago and didn't have complaints of pressure. A few days ago she had very strong contractions and once she laid down they tampered off. She has noticed part of her mucous plug falling out. Has not had any issue with constipation. She works at [employer] is trying to work as long as possible but is struggling. She would like to file for  Disability related to pregnancy for work note given to pt. [facility] Today she reports pressure, cramps and back pain. Increase in discharge. Contractions: Irregular. Vag. Bleeding: None.  Movement: Present. denies leaking of any other fluid besides vaginal discharge.  Review of Systems:   Pertinent items are noted in HPI Denies abnormal vaginal discharge w/ itching/odor/irritation, headaches, visual changes, shortness of breath, chest pain, abdominal pain, severe nausea/vomiting, or problems with urination or bowel movements unless otherwise stated  above. Pertinent History Reviewed:  Reviewed past medical,surgical, social, obstetrical and family history.  Reviewed problem list, medications and allergies. Physical Assessment:   Vitals:   10/24/17 0950 10/24/17 0955  BP: (!) 144/96 (!) 126/94  Pulse: (!) 123 (!) 115  Weight: 135 lb 9.6 oz (61.5 kg)   Body mass index is 26.48 kg/m.           Physical Examination:   General appearance: alert, well appearing, and in no distress and oriented to person, place, and time  Mental status: alert, oriented to person, place, and time, normal mood, behavior, speech, dress, motor activity, and thought processes  Skin: warm & dry   Extremities: Edema: Trace    Cardiovascular: normal heart rate noted  Respiratory: normal respiratory effort, no distress  Abdomen: gravid, soft, non-tender  Pelvic: Cervical exam performed, firm posterior, 50% thinned  VAGINA: Normal vaginal secretion  WET MOUNT: neg for trick, yeast and clue         Fetal Status: Fetal Heart Rate (bpm): 140 Fundal Height: 39 cm Movement: Present    Fetal Surveillance Testing today: None  Results for orders placed or performed in visit on 10/24/17 (from the past 24 hour(s))  POC Urinalysis Dipstick OB   Collection Time: 10/24/17  9:54 AM  Result Value Ref Range   Color, UA     Clarity, UA     Glucose, UA Negative Negative   Bilirubin, UA     Ketones, UA 1+    Spec Grav, UA     Blood, UA neg    pH, UA  POC Protein UA Negative Negative, Trace   Urobilinogen, UA     Nitrite, UA neg    Leukocytes, UA Negative Negative   Appearance     Odor      Assessment & Plan:  1) High-risk pregnancy G2P1001 at 80w0dwith an Estimated Date of Delivery: 12/19/17   2) Duodeneal atresia w/ polyhydromanios , preterm uterine contractions without cervical change.   Meds: No orders of the defined types were placed in this encounter.   Labs/procedures today: FFN  Treatment Plan:   Rx Procardia 30 mg tablet bid for comfort  measure.pt to monitor for bp issues F/u 2 weeks for HROB Follow-up: No follow-ups on file.  Orders Placed This Encounter  Procedures  . POC Urinalysis Dipstick OB   By signing my name below, I, MSamul Dada attest that this documentation has been prepared under the direction and in the presence of FJonnie Kind MD. Electronically Signed: [place] 10/24/17. 10:28 AM.  I personally performed the services described in this documentation, which was SCRIBED in my presence. The recorded information has been reviewed and considered accurate. It has been edited as necessary during review. JJonnie Kind MD

## 2017-10-24 NOTE — Progress Notes (Signed)
White, watery discharge with no odor. JSY

## 2017-10-25 ENCOUNTER — Other Ambulatory Visit (HOSPITAL_COMMUNITY): Payer: Self-pay

## 2017-10-26 ENCOUNTER — Telehealth (HOSPITAL_COMMUNITY): Payer: Self-pay | Admitting: *Deleted

## 2017-10-26 NOTE — Telephone Encounter (Signed)
Name and DOB verified, calling with results of amniocentesis.  Normal female karyotype, micrarray pending.  Pt voiced understanding.

## 2017-10-27 ENCOUNTER — Encounter (HOSPITAL_COMMUNITY): Payer: Self-pay

## 2017-10-27 ENCOUNTER — Ambulatory Visit (HOSPITAL_COMMUNITY)
Admission: RE | Admit: 2017-10-27 | Discharge: 2017-10-27 | Disposition: A | Discharge status: Home / Self Care | Payer: Managed Care, Other (non HMO) | Source: Ambulatory Visit | Attending: Obstetrics and Gynecology | Admitting: Obstetrics and Gynecology

## 2017-10-27 DIAGNOSIS — Z3A32 32 weeks gestation of pregnancy: Secondary | ICD-10-CM | POA: Diagnosis not present

## 2017-10-27 DIAGNOSIS — O358XX0 Maternal care for other (suspected) fetal abnormality and damage, not applicable or unspecified: Secondary | ICD-10-CM | POA: Insufficient documentation

## 2017-10-27 DIAGNOSIS — O359XX0 Maternal care for (suspected) fetal abnormality and damage, unspecified, not applicable or unspecified: Secondary | ICD-10-CM

## 2017-10-27 DIAGNOSIS — O403XX0 Polyhydramnios, third trimester, not applicable or unspecified: Secondary | ICD-10-CM | POA: Diagnosis not present

## 2017-10-27 DIAGNOSIS — O289 Unspecified abnormal findings on antenatal screening of mother: Secondary | ICD-10-CM | POA: Diagnosis not present

## 2017-10-27 DIAGNOSIS — O35DXX0 Maternal care for other (suspected) fetal abnormality and damage, fetal gastrointestinal anomalies, not applicable or unspecified: Secondary | ICD-10-CM

## 2017-10-29 ENCOUNTER — Inpatient Hospital Stay (HOSPITAL_COMMUNITY)
Admission: AD | Admit: 2017-10-29 | Discharge: 2017-10-30 | Disposition: A | Discharge status: Home / Self Care | Payer: Managed Care, Other (non HMO) | Source: Ambulatory Visit | Attending: Obstetrics and Gynecology | Admitting: Obstetrics and Gynecology

## 2017-10-29 ENCOUNTER — Encounter (HOSPITAL_COMMUNITY): Payer: Self-pay | Admitting: *Deleted

## 2017-10-29 ENCOUNTER — Inpatient Hospital Stay (HOSPITAL_BASED_OUTPATIENT_CLINIC_OR_DEPARTMENT_OTHER): Payer: Managed Care, Other (non HMO)

## 2017-10-29 DIAGNOSIS — O368130 Decreased fetal movements, third trimester, not applicable or unspecified: Secondary | ICD-10-CM | POA: Diagnosis not present

## 2017-10-29 DIAGNOSIS — O368190 Decreased fetal movements, unspecified trimester, not applicable or unspecified: Secondary | ICD-10-CM

## 2017-10-29 DIAGNOSIS — O4703 False labor before 37 completed weeks of gestation, third trimester: Secondary | ICD-10-CM | POA: Diagnosis not present

## 2017-10-29 DIAGNOSIS — O479 False labor, unspecified: Secondary | ICD-10-CM | POA: Diagnosis not present

## 2017-10-29 DIAGNOSIS — Z91040 Latex allergy status: Secondary | ICD-10-CM | POA: Diagnosis not present

## 2017-10-29 DIAGNOSIS — Z3A32 32 weeks gestation of pregnancy: Secondary | ICD-10-CM | POA: Diagnosis not present

## 2017-10-29 DIAGNOSIS — Z90721 Acquired absence of ovaries, unilateral: Secondary | ICD-10-CM | POA: Insufficient documentation

## 2017-10-29 DIAGNOSIS — O34219 Maternal care for unspecified type scar from previous cesarean delivery: Secondary | ICD-10-CM | POA: Diagnosis not present

## 2017-10-29 DIAGNOSIS — Z8249 Family history of ischemic heart disease and other diseases of the circulatory system: Secondary | ICD-10-CM | POA: Diagnosis not present

## 2017-10-29 DIAGNOSIS — R102 Pelvic and perineal pain: Secondary | ICD-10-CM | POA: Diagnosis present

## 2017-10-29 DIAGNOSIS — O403XX0 Polyhydramnios, third trimester, not applicable or unspecified: Secondary | ICD-10-CM | POA: Insufficient documentation

## 2017-10-29 LAB — URINALYSIS, ROUTINE W REFLEX MICROSCOPIC
Bilirubin Urine: NEGATIVE
GLUCOSE, UA: NEGATIVE mg/dL
HGB URINE DIPSTICK: NEGATIVE
Ketones, ur: NEGATIVE mg/dL
Leukocytes, UA: NEGATIVE
Nitrite: NEGATIVE
PH: 6 (ref 5.0–8.0)
Protein, ur: NEGATIVE mg/dL
SPECIFIC GRAVITY, URINE: 1.013 (ref 1.005–1.030)

## 2017-10-29 MED ORDER — NIFEDIPINE 10 MG PO CAPS
10.0000 mg | ORAL_CAPSULE | Freq: Once | ORAL | Status: AC
  Administered 2017-10-29: 10 mg via ORAL
  Filled 2017-10-29: qty 1

## 2017-10-29 MED ORDER — NIFEDIPINE 10 MG PO CAPS
20.0000 mg | ORAL_CAPSULE | Freq: Once | ORAL | Status: DC
  Filled 2017-10-29: qty 2

## 2017-10-29 NOTE — MAU Note (Signed)
Ctxs since this am. Decreased FM today. Having pelvic pressure

## 2017-10-29 NOTE — MAU Provider Note (Addendum)
History     CSN: [id number]  Arrival date and time: 10/29/17 2123   First Provider Initiated Contact with Patient 10/29/17 2212      Chief Complaint  Patient presents with  . Contractions  . Pelvic Pain  . Decreased Fetal Movement   HPI   [name] is a 22 y.o. female G2P1001 at [redacted]w[redacted]d with polyhydramnios'  here in MAU with contractions and decreased fetal movement. This is a new problem. The contractions started this morning around 6 am. The contractions are coming every 3 mins. The pain comes and goes. No bleeding, no leaking. No history of preterm delivery with previous baby.  Previous C-section, plans TOLAC. Says since she arrived she has felt her baby move. Last intercourse was 11 am today. Has procardia at home however does not feel it works.   OB History    Gravida  2   Para  1   Term  1   Preterm  0   AB  0   Living  1     SAB      TAB      Ectopic      Multiple      Live Births  1           Past Medical History:  Diagnosis Date  . Anxiety   . Arthritis   . Depression     Past Surgical History:  Procedure Laterality Date  . CESAREAN SECTION    . HERNIA REPAIR     22 yrs old  . LAPAROSCOPIC LYSIS OF ADHESIONS  12/21/2016   Procedure: LAPAROSCOPIC LYSIS OF ADHESIONS;  Surgeon: Tilda Burrow, MD;  Location: [facility];  Service: Gynecology;;  . LAPAROSCOPIC SALPINGO OOPHERECTOMY Left 12/21/2016   Procedure: LAPAROSCOPIC LEFT SALPINGO OOPHORECTOMY;  Surgeon: Tilda Burrow, MD;  Location: [facility];  Service: Gynecology;  Laterality: Left;  . LAPAROSCOPY  12/21/2016   Procedure: LAPAROSCOPY DIAGNOSTIC;  Surgeon: Tilda Burrow, MD;  Location: [facility];  Service: Gynecology;;    Family History  Problem Relation Age of Onset  . Heart disease Paternal Grandfather   . Depression Maternal Grandmother   . Drug abuse Father   . Hypertension Father   . Hypertension Paternal Aunt     Social History   Tobacco Use  . Smoking status:  Never Smoker  . Smokeless tobacco: Never Used  Substance Use Topics  . Alcohol use: Not Currently    Frequency: Never    Comment: occ; not now  . Drug use: No    Allergies:  Allergies  Allergen Reactions  . Latex Rash    Medications Prior to Admission  Medication Sig Dispense Refill Last Dose  . NIFEdipine (PROCARDIA XL) 30 MG 24 hr tablet Take 1 tablet (30 mg total) by mouth 2 (two) times daily as needed (contractions). 60 tablet 0 10/29/2017 at Unknown time  . NIFEdipine (PROCARDIA XL) 30 MG 24 hr tablet Take 1 tablet (30 mg total) by mouth daily. 30 tablet 1 10/29/2017 at Unknown time  . Prenatal Vit-Fe Fumarate-FA (PRENATAL VITAMIN PO) Take by mouth daily.   10/29/2017 at Unknown time   Results for orders placed or performed during the hospital encounter of 10/29/17 (from the past 48 hour(s))  Urinalysis, Routine w reflex microscopic     Status: None   Collection Time: 10/29/17  9:53 PM  Result Value Ref Range   Color, Urine YELLOW YELLOW   APPearance CLEAR CLEAR   Specific Gravity, Urine 1.013  1.005 - 1.030   pH 6.0 5.0 - 8.0   Glucose, UA NEGATIVE NEGATIVE mg/dL   Hgb urine dipstick NEGATIVE NEGATIVE   Bilirubin Urine NEGATIVE NEGATIVE   Ketones, ur NEGATIVE NEGATIVE mg/dL   Protein, ur NEGATIVE NEGATIVE mg/dL   Nitrite NEGATIVE NEGATIVE   Leukocytes, UA NEGATIVE NEGATIVE    Comment: Performed at [facility], [address] [place], [place] [address]   Review of Systems  Gastrointestinal: Positive for abdominal pain. Negative for nausea and vomiting.  Genitourinary: Positive for pelvic pain. Negative for dysuria, vaginal bleeding and vaginal discharge.   Physical Exam   Blood pressure 125/83, pulse 79, temperature 98.3 F (36.8 C), resp. rate 18, height 5' (1.524 m), weight 63 kg, last menstrual period 03/14/2017.  Physical Exam  Constitutional: She is oriented to person, place, and time. She appears well-developed and well-nourished. No distress.  HENT:   Head: Normocephalic.  Eyes: Pupils are equal, round, and reactive to light.  Cardiovascular: Normal rate.  Respiratory: Effort normal. No respiratory distress. She has no wheezes. She has no rales.  GI: Soft. She exhibits no distension. There is no tenderness. There is no rebound.  Genitourinary:  Genitourinary Comments: Dilation: Closed Effacement (%): 50 Exam by:: Venia CarbonJennifer Cortavious Nix NP   Musculoskeletal: Normal range of motion.  Neurological: She is alert and oriented to person, place, and time.  Skin: Skin is warm. She is not diaphoretic.  Psychiatric: Her behavior is normal.   Fetal Tracing: Baseline: 130 bpm Variability: moderate  Accelerations: 15x15 Decelerations: none Toco: irregular pattern, Q 3-5  MAU Course  Procedures  None  MDM  BPP: 8/8 Procardia 20 mg PO Patient given a litter of fluid to drink PO while here  Patient declines pain medication at this time. Patient given one dose of procardia and did not want any further doses as it did not help her contractions the previous time she took them Cervix unchanged Discussed patient with Dr. Emelda FearFerguson. Ok for DC.  Patient declines pain medication prior to dc hom.   Assessment and Plan   A:  1. Braxton Hicks contractions   2. Decreased fetal movement   3. [redacted] weeks gestation of pregnancy   4. Polyhydramnios affecting pregnancy in third trimester     P:  Discharge home in stable condition Preterm labor precautions F/U with OB as scheduled or sooner if needed Kick counts  Marcene Laskowski, Harolyn RutherfordJennifer I, NP 10/30/2017 11:29 AM

## 2017-10-30 DIAGNOSIS — Z3A32 32 weeks gestation of pregnancy: Secondary | ICD-10-CM

## 2017-10-30 DIAGNOSIS — O368130 Decreased fetal movements, third trimester, not applicable or unspecified: Secondary | ICD-10-CM

## 2017-10-30 DIAGNOSIS — O479 False labor, unspecified: Secondary | ICD-10-CM

## 2017-10-30 MED ORDER — BETAMETHASONE SOD PHOS & ACET 6 (3-3) MG/ML IJ SUSP: 12.0000 mg | Freq: Once | INTRAMUSCULAR | Status: DC

## 2017-10-30 NOTE — Telephone Encounter (Signed)
Unable to document phone call at this time.

## 2017-10-30 NOTE — Discharge Instructions (Signed)

## 2017-11-02 ENCOUNTER — Telehealth: Payer: Self-pay | Admitting: *Deleted

## 2017-11-02 ENCOUNTER — Encounter: Payer: Self-pay | Admitting: Women's Health

## 2017-11-02 ENCOUNTER — Other Ambulatory Visit (HOSPITAL_COMMUNITY): Payer: Self-pay | Admitting: *Deleted

## 2017-11-02 ENCOUNTER — Ambulatory Visit (INDEPENDENT_AMBULATORY_CARE_PROVIDER_SITE_OTHER): Payer: 59 | Admitting: Women's Health

## 2017-11-02 VITALS — BP 123/93 | HR 83 | Wt 139.0 lb

## 2017-11-02 DIAGNOSIS — O403XX0 Polyhydramnios, third trimester, not applicable or unspecified: Secondary | ICD-10-CM | POA: Diagnosis not present

## 2017-11-02 DIAGNOSIS — O358XX0 Maternal care for other (suspected) fetal abnormality and damage, not applicable or unspecified: Secondary | ICD-10-CM

## 2017-11-02 DIAGNOSIS — O0993 Supervision of high risk pregnancy, unspecified, third trimester: Secondary | ICD-10-CM

## 2017-11-02 DIAGNOSIS — O139 Gestational [pregnancy-induced] hypertension without significant proteinuria, unspecified trimester: Secondary | ICD-10-CM | POA: Insufficient documentation

## 2017-11-02 DIAGNOSIS — O35DXX0 Maternal care for other (suspected) fetal abnormality and damage, fetal gastrointestinal anomalies, not applicable or unspecified: Secondary | ICD-10-CM

## 2017-11-02 DIAGNOSIS — O133 Gestational [pregnancy-induced] hypertension without significant proteinuria, third trimester: Secondary | ICD-10-CM

## 2017-11-02 DIAGNOSIS — Z1389 Encounter for screening for other disorder: Secondary | ICD-10-CM

## 2017-11-02 DIAGNOSIS — Z3A33 33 weeks gestation of pregnancy: Secondary | ICD-10-CM

## 2017-11-02 DIAGNOSIS — Z331 Pregnant state, incidental: Secondary | ICD-10-CM

## 2017-11-02 DIAGNOSIS — O0990 Supervision of high risk pregnancy, unspecified, unspecified trimester: Secondary | ICD-10-CM

## 2017-11-02 DIAGNOSIS — O368130 Decreased fetal movements, third trimester, not applicable or unspecified: Secondary | ICD-10-CM | POA: Diagnosis not present

## 2017-11-02 LAB — POCT URINALYSIS DIPSTICK OB
Blood, UA: NEGATIVE
Glucose, UA: NEGATIVE
Ketones, UA: NEGATIVE
Leukocytes, UA: NEGATIVE
Nitrite, UA: NEGATIVE
POC,PROTEIN,UA: NEGATIVE

## 2017-11-02 NOTE — Telephone Encounter (Signed)
Patient stated she started spotting around 1am this morning. Lost her mucous plug and has since been having more "watery discharge" and is cramping and hurting more. Says she is having a lot more pressure in her vaginal area to the point that she cannot close her legs.

## 2017-11-02 NOTE — Patient Instructions (Signed)
Mariea ClontsSamantha B Faro, I greatly value your feedback.  If you receive a survey following your visit with us today, we appreciate you taking the time to fill it out.  Thanks, Joellyn HaffKim Diora Bellizzi, CNM, WHNP-BC   Call the office [phone]) or go to [facility] if:  You begin to have strong, frequent contractions  Your water breaks.  Sometimes it is a big gush of fluid, sometimes it is just a trickle that keeps getting your panties wet or running down your legs  You have vaginal bleeding.  It is normal to have a small amount of spotting if your cervix was checked.   You don't feel your baby moving like normal.  If you don't, get you something to eat and drink and lay down and focus on feeling your baby move.  You should feel at least 10 movements in 2 hours.  If you don't, you should call the office or go to [facility].    Call the office [phone]) or go to [facility] for these signs of pre-eclampsia:  Severe headache that does not go away with Tylenol  Visual changes- seeing spots, double, blurred vision  Pain under your right breast or upper abdomen that does not go away with Tums or heartburn medicine  Nausea and/or vomiting  Severe swelling in your hands, feet, and face     Preterm Labor and Birth Information The normal length of a pregnancy is 39-41 weeks. Preterm labor is when labor starts before 37 completed weeks of pregnancy. What are the risk factors for preterm labor? Preterm labor is more likely to occur in women who:  Have certain infections during pregnancy such as a bladder infection, sexually transmitted infection, or infection inside the uterus (chorioamnionitis).  Have a shorter-than-normal cervix.  Have gone into preterm labor before.  Have had surgery on their cervix.  Are younger than age 22 or older than age 22.  Are African American.  Are pregnant with twins or multiple babies (multiple gestation).  Take street drugs or smoke while  pregnant.  Do not gain enough weight while pregnant.  Became pregnant shortly after having been pregnant.  What are the symptoms of preterm labor? Symptoms of preterm labor include:  Cramps similar to those that can happen during a menstrual period. The cramps may happen with diarrhea.  Pain in the abdomen or lower back.  Regular uterine contractions that may feel like tightening of the abdomen.  A feeling of increased pressure in the pelvis.  Increased watery or bloody mucus discharge from the vagina.  Water breaking (ruptured amniotic sac).  Why is it important to recognize signs of preterm labor? It is important to recognize signs of preterm labor because babies who are born prematurely may not be fully developed. This can put them at an increased risk for:  Long-term (chronic) heart and lung problems.  Difficulty immediately after birth with regulating body systems, including blood sugar, body temperature, heart rate, and breathing rate.  Bleeding in the brain.  Cerebral palsy.  Learning difficulties.  Death.  These risks are highest for babies who are born before 34 weeks of pregnancy. How is preterm labor treated? Treatment depends on the length of your pregnancy, your condition, and the health of your baby. It may involve:  Having a stitch (suture) placed in your cervix to prevent your cervix from opening too early (cerclage).  Taking or being given medicines, such as: ? Hormone medicines. These may be given early in pregnancy to help support  the pregnancy. ? Medicine to stop contractions. ? Medicines to help mature the baby's lungs. These may be prescribed if the risk of delivery is high. ? Medicines to prevent your baby from developing cerebral palsy.  If the labor happens before 34 weeks of pregnancy, you may need to stay in the hospital. What should I do if I think I am in preterm labor? If you think that you are going into preterm labor, call your health  care provider right away. How can I prevent preterm labor in future pregnancies? To increase your chance of having a full-term pregnancy:  Do not use any tobacco products, such as cigarettes, chewing tobacco, and e-cigarettes. If you need help quitting, ask your health care provider.  Do not use street drugs or medicines that have not been prescribed to you during your pregnancy.  Talk with your health care provider before taking any herbal supplements, even if you have been taking them regularly.  Make sure you gain a healthy amount of weight during your pregnancy.  Watch for infection. If you think that you might have an infection, get it checked right away.  Make sure to tell your health care provider if you have gone into preterm labor before.  This information is not intended to replace advice given to you by your health care provider. Make sure you discuss any questions you have with your health care provider. Document Released: 04/24/2003 Document Revised: 07/15/2015 Document Reviewed: 06/25/2015 Elsevier Interactive Patient Education  2018 [employer].

## 2017-11-02 NOTE — Progress Notes (Signed)
Work-in HIGH-RISK PREGNANCY VISIT Patient name: Jessica Poole MRN [id number]  Date of birth: December 25, 1995 Chief Complaint:   work-in-ob; Vaginal Bleeding; and leaking fluid  History of Present Illness:   Jessica Poole is a 22 y.o. G38P1001 female at 57w2dwith an Estimated Date of Delivery: 12/19/17 being seen today for ongoing management of a high-risk pregnancy complicated by fetal duodenal atresia w/polyhydramnios, GHTN officially dx today  Today she is being seen as a work-in for reports of spotting that started at 0100, mixed with liquidy discharge. No big gush, leaking down legs. No itching/odor/irritation. Nothing in vagina in last 24hrs. Lots of pelvic pressure, feels like she can't close her legs. Feels like she's in labor. [facility]. Cx closed/50%. Also reports decreased fm last few days. BP elevated today, has been elevated since 30.4wks. Has been having occ headaches, none right now, some blurred vision w/ headache. No ruq/epigastric pain, n/v.  Contractions: Regular.  .  Movement: (!) Decreased. reports leaking of fluid.  Review of Systems:   Pertinent items are noted in HPI Denies abnormal vaginal discharge w/ itching/odor/irritation, headaches, visual changes, shortness of breath, chest pain, abdominal pain, severe nausea/vomiting, or problems with urination or bowel movements unless otherwise stated above. Pertinent History Reviewed:  Reviewed past medical,surgical, social, obstetrical and family history.  Reviewed problem list, medications and allergies. Physical Assessment:   Vitals:   11/02/17 1050  BP: (!) 123/93  Pulse: 83  Weight: 139 lb (63 kg)  Body mass index is 27.15 kg/m.           Physical Examination:   General appearance: alert, well appearing, and in no distress  Mental status: alert, oriented to person, place, and time  Skin: warm & dry   Extremities: Edema: None    Cardiovascular: normal heart rate noted  Respiratory: normal  respiratory effort, no distress  Abdomen: gravid, soft, non-tender  Pelvic: spec exam: cx visually closed, scant amt light pink d/c, no pooling, no change w/ valsalva, fern, nitrazine & wet prep neg  Dilation: Closed Effacement (%): 50 Station: -2 x 2 exams  Fetal Status:   Fundal Height: 40 cm Movement: (!) Decreased    Fetal Surveillance Testing today: NST: FHR baseline 135 bpm, Variability: moderate, Accelerations:present, Decelerations:  Absent= Cat 1/Reactive Toco: q 1-773ms     Results for orders placed or performed in visit on 11/02/17 (from the past 24 hour(s))  POC Urinalysis Dipstick OB   Collection Time: 11/02/17 10:54 AM  Result Value Ref Range   Color, UA     Clarity, UA     Glucose, UA Negative Negative   Bilirubin, UA     Ketones, UA neg    Spec Grav, UA     Blood, UA neg    pH, UA     POC Protein UA Negative Negative, Trace   Urobilinogen, UA     Nitrite, UA neg    Leukocytes, UA Negative Negative   Appearance     Odor      Assessment & Plan:  1) High-risk pregnancy G2P1001 at 3359w2dth an Estimated Date of Delivery: 12/19/17   2) Fetal duodenal atresia w/ polyhdramnios, followed by MFM, met [facility]/ [facility] G[facility], dx today, will get pre-e labs, discussed pre-e s/s, reasons to seek care  4) Preterm contractions w/o cervical change> sve cl/50 x 2, states procardia doesn't help. Reviewed s/s ptl, reasons to seek care  Meds:  No orders of the defined types were placed in this encounter.   Labs/procedures today: wet prep, sve, nst  Treatment Plan:  Growth u/s q 3-4wks     2x/wk testing nst alt w/ bpp/dopp      IOL @ 37wks   Reviewed: Preterm labor symptoms and general obstetric precautions including but not limited to vaginal bleeding, contractions, leaking of fluid and fetal movement were reviewed in detail with the patient.  All questions were answered.  Follow-up: Return for Friday for HROB/NST.  Orders Placed This Encounter    Procedures  . CBC  . Comprehensive metabolic panel  . Protein / creatinine ratio, urine  . POC Urinalysis Dipstick OB   Roma Schanz CNM, [facility] 11/02/2017 1:10 PM

## 2017-11-03 ENCOUNTER — Inpatient Hospital Stay (HOSPITAL_COMMUNITY)
Admission: AD | Admit: 2017-11-03 | Discharge: 2017-11-04 | DRG: 805 | Disposition: A | Discharge status: Home / Self Care | Payer: Managed Care, Other (non HMO) | Attending: Obstetrics & Gynecology | Admitting: Obstetrics & Gynecology

## 2017-11-03 ENCOUNTER — Inpatient Hospital Stay (HOSPITAL_COMMUNITY): Payer: Managed Care, Other (non HMO) | Admitting: Anesthesiology

## 2017-11-03 ENCOUNTER — Encounter (HOSPITAL_COMMUNITY): Payer: Self-pay

## 2017-11-03 ENCOUNTER — Telehealth (HOSPITAL_COMMUNITY): Payer: Self-pay | Admitting: *Deleted

## 2017-11-03 ENCOUNTER — Other Ambulatory Visit: Payer: Self-pay

## 2017-11-03 DIAGNOSIS — O409XX0 Polyhydramnios, unspecified trimester, not applicable or unspecified: Secondary | ICD-10-CM | POA: Diagnosis present

## 2017-11-03 DIAGNOSIS — Z91040 Latex allergy status: Secondary | ICD-10-CM | POA: Diagnosis not present

## 2017-11-03 DIAGNOSIS — Z3A33 33 weeks gestation of pregnancy: Secondary | ICD-10-CM

## 2017-11-03 DIAGNOSIS — O42013 Preterm premature rupture of membranes, onset of labor within 24 hours of rupture, third trimester: Secondary | ICD-10-CM | POA: Diagnosis not present

## 2017-11-03 DIAGNOSIS — O139 Gestational [pregnancy-induced] hypertension without significant proteinuria, unspecified trimester: Secondary | ICD-10-CM | POA: Diagnosis present

## 2017-11-03 DIAGNOSIS — O35DXX0 Maternal care for other (suspected) fetal abnormality and damage, fetal gastrointestinal anomalies, not applicable or unspecified: Secondary | ICD-10-CM | POA: Diagnosis present

## 2017-11-03 DIAGNOSIS — O403XX0 Polyhydramnios, third trimester, not applicable or unspecified: Secondary | ICD-10-CM | POA: Diagnosis present

## 2017-11-03 DIAGNOSIS — O0990 Supervision of high risk pregnancy, unspecified, unspecified trimester: Secondary | ICD-10-CM

## 2017-11-03 DIAGNOSIS — O358XX0 Maternal care for other (suspected) fetal abnormality and damage, not applicable or unspecified: Secondary | ICD-10-CM | POA: Diagnosis present

## 2017-11-03 DIAGNOSIS — O42913 Preterm premature rupture of membranes, unspecified as to length of time between rupture and onset of labor, third trimester: Secondary | ICD-10-CM | POA: Diagnosis present

## 2017-11-03 DIAGNOSIS — O34219 Maternal care for unspecified type scar from previous cesarean delivery: Secondary | ICD-10-CM | POA: Diagnosis present

## 2017-11-03 DIAGNOSIS — O134 Gestational [pregnancy-induced] hypertension without significant proteinuria, complicating childbirth: Secondary | ICD-10-CM | POA: Diagnosis present

## 2017-11-03 DIAGNOSIS — O42919 Preterm premature rupture of membranes, unspecified as to length of time between rupture and onset of labor, unspecified trimester: Secondary | ICD-10-CM | POA: Diagnosis present

## 2017-11-03 LAB — COMPREHENSIVE METABOLIC PANEL
A/G RATIO: 1.4 (ref 1.2–2.2)
ALK PHOS: 115 IU/L (ref 39–117)
ALT: 11 IU/L (ref 0–32)
AST: 21 IU/L (ref 0–40)
Albumin: 4.3 g/dL (ref 3.5–5.5)
BILIRUBIN TOTAL: 0.4 mg/dL (ref 0.0–1.2)
BUN/Creatinine Ratio: 14 (ref 9–23)
BUN: 9 mg/dL (ref 6–20)
CHLORIDE: 103 mmol/L (ref 96–106)
CO2: 19 mmol/L — ABNORMAL LOW (ref 20–29)
Calcium: 9.7 mg/dL (ref 8.7–10.2)
Creatinine, Ser: 0.64 mg/dL (ref 0.57–1.00)
GFR calc non Af Amer: 127 mL/min/{1.73_m2} (ref >59)
GFR, EST AFRICAN AMERICAN: 146 mL/min/{1.73_m2} (ref >59)
Globulin, Total: 3 g/dL (ref 1.5–4.5)
Glucose: 72 mg/dL (ref 65–99)
POTASSIUM: 4.5 mmol/L (ref 3.5–5.2)
Sodium: 140 mmol/L (ref 134–144)
Total Protein: 7.3 g/dL (ref 6.0–8.5)

## 2017-11-03 LAB — CBC
HCT: 39.7 % (ref 36.0–46.0)
HEMATOCRIT: 42 % (ref 34.0–46.6)
HEMOGLOBIN: 14.6 g/dL (ref 11.1–15.9)
Hemoglobin: 14.2 g/dL (ref 12.0–15.0)
MCH: 33 pg (ref 26.6–33.0)
MCH: 33.5 pg (ref 26.0–34.0)
MCHC: 34.8 g/dL (ref 31.5–35.7)
MCHC: 35.8 g/dL (ref 30.0–36.0)
MCV: 95 fL (ref 79–97)
MCV: 93.6 fL (ref 78.0–100.0)
PLATELETS: 178 10*3/uL (ref 150–450)
PLATELETS: 172 10*3/uL (ref 150–400)
RBC: 4.43 x10E6/uL (ref 3.77–5.28)
RBC: 4.24 MIL/uL (ref 3.87–5.11)
RDW: 12.2 % — ABNORMAL LOW (ref 12.3–15.4)
RDW: 12.6 % (ref 11.5–15.5)
WBC: 12.3 10*3/uL — AB (ref 3.4–10.8)
WBC: 16.7 10*3/uL — ABNORMAL HIGH (ref 4.0–10.5)

## 2017-11-03 LAB — PROTEIN / CREATININE RATIO, URINE
CREATININE, UR: 85.1 mg/dL
PROTEIN UR: 9.5 mg/dL
PROTEIN/CREAT RATIO: 112 mg/g{creat} (ref 0–200)

## 2017-11-03 LAB — TYPE AND SCREEN
ABO/RH(D): B POS
Antibody Screen: NEGATIVE

## 2017-11-03 LAB — ABO/RH: ABO/RH(D): B POS

## 2017-11-03 MED ORDER — DIPHENHYDRAMINE HCL 50 MG/ML IJ SOLN: 12.5000 mg | INTRAMUSCULAR | Status: DC | PRN

## 2017-11-03 MED ORDER — LACTATED RINGERS IV SOLN: 500.0000 mL | INTRAVENOUS | Status: DC | PRN

## 2017-11-03 MED ORDER — LIDOCAINE HCL (PF) 1 % IJ SOLN
INTRAMUSCULAR | Status: DC | PRN
  Administered 2017-11-03: 8 mL via EPIDURAL

## 2017-11-03 MED ORDER — OXYTOCIN 40 UNITS IN LACTATED RINGERS INFUSION - SIMPLE MED
2.5000 [IU]/h | INTRAVENOUS | Status: DC
  Filled 2017-11-03: qty 1000

## 2017-11-03 MED ORDER — IBUPROFEN 600 MG PO TABS
600.0000 mg | ORAL_TABLET | Freq: Four times a day (QID) | ORAL | Status: DC
  Administered 2017-11-03 – 2017-11-04 (×2): 600 mg via ORAL
  Filled 2017-11-03 (×2): qty 1

## 2017-11-03 MED ORDER — ONDANSETRON HCL 4 MG/2ML IJ SOLN: 4.0000 mg | Freq: Four times a day (QID) | INTRAMUSCULAR | Status: DC | PRN

## 2017-11-03 MED ORDER — BENZOCAINE-MENTHOL 20-0.5 % EX AERO: 1.0000 "application " | INHALATION_SPRAY | CUTANEOUS | Status: DC | PRN

## 2017-11-03 MED ORDER — DOCUSATE SODIUM 100 MG PO CAPS
100.0000 mg | ORAL_CAPSULE | Freq: Every day | ORAL | Status: DC
  Filled 2017-11-03: qty 1

## 2017-11-03 MED ORDER — PHENYLEPHRINE 40 MCG/ML (10ML) SYRINGE FOR IV PUSH (FOR BLOOD PRESSURE SUPPORT)
80.0000 ug | PREFILLED_SYRINGE | INTRAVENOUS | Status: DC | PRN
  Filled 2017-11-03: qty 5

## 2017-11-03 MED ORDER — SIMETHICONE 80 MG PO CHEW: 80.0000 mg | CHEWABLE_TABLET | ORAL | Status: DC | PRN

## 2017-11-03 MED ORDER — SODIUM CHLORIDE 0.9 % IV SOLN
2.0000 g | Freq: Four times a day (QID) | INTRAVENOUS | Status: DC
  Administered 2017-11-03 (×2): 2 g via INTRAVENOUS
  Filled 2017-11-03: qty 2000
  Filled 2017-11-03: qty 2
  Filled 2017-11-03 (×2): qty 2000

## 2017-11-03 MED ORDER — EPHEDRINE 5 MG/ML INJ
10.0000 mg | INTRAVENOUS | Status: DC | PRN
  Filled 2017-11-03: qty 2

## 2017-11-03 MED ORDER — ACETAMINOPHEN 325 MG PO TABS
650.0000 mg | ORAL_TABLET | ORAL | Status: DC | PRN
  Administered 2017-11-04: 650 mg via ORAL
  Filled 2017-11-03: qty 2

## 2017-11-03 MED ORDER — OXYCODONE-ACETAMINOPHEN 5-325 MG PO TABS: 1.0000 | ORAL_TABLET | ORAL | Status: DC | PRN

## 2017-11-03 MED ORDER — DIBUCAINE 1 % RE OINT: 1.0000 "application " | TOPICAL_OINTMENT | RECTAL | Status: DC | PRN

## 2017-11-03 MED ORDER — WITCH HAZEL-GLYCERIN EX PADS: 1.0000 "application " | MEDICATED_PAD | CUTANEOUS | Status: DC | PRN

## 2017-11-03 MED ORDER — ZOLPIDEM TARTRATE 5 MG PO TABS: 5.0000 mg | ORAL_TABLET | Freq: Every evening | ORAL | Status: DC | PRN

## 2017-11-03 MED ORDER — LIDOCAINE HCL (PF) 1 % IJ SOLN
30.0000 mL | INTRAMUSCULAR | Status: DC | PRN
  Filled 2017-11-03: qty 30

## 2017-11-03 MED ORDER — OXYTOCIN BOLUS FROM INFUSION
500.0000 mL | Freq: Once | INTRAVENOUS | Status: AC
  Administered 2017-11-03: 500 mL via INTRAVENOUS

## 2017-11-03 MED ORDER — PRENATAL MULTIVITAMIN CH
1.0000 | ORAL_TABLET | Freq: Every day | ORAL | Status: DC
  Filled 2017-11-03: qty 1

## 2017-11-03 MED ORDER — LACTATED RINGERS IV SOLN: INTRAVENOUS | Status: DC

## 2017-11-03 MED ORDER — ACETAMINOPHEN 325 MG PO TABS: 650.0000 mg | ORAL_TABLET | ORAL | Status: DC | PRN

## 2017-11-03 MED ORDER — OXYCODONE-ACETAMINOPHEN 5-325 MG PO TABS: 2.0000 | ORAL_TABLET | ORAL | Status: DC | PRN

## 2017-11-03 MED ORDER — PRENATAL MULTIVITAMIN CH: 1.0000 | ORAL_TABLET | Freq: Every day | ORAL | Status: DC

## 2017-11-03 MED ORDER — SENNOSIDES-DOCUSATE SODIUM 8.6-50 MG PO TABS
2.0000 | ORAL_TABLET | ORAL | Status: DC
  Administered 2017-11-04: 2 via ORAL
  Filled 2017-11-03: qty 2

## 2017-11-03 MED ORDER — ONDANSETRON HCL 4 MG/2ML IJ SOLN: 4.0000 mg | INTRAMUSCULAR | Status: DC | PRN

## 2017-11-03 MED ORDER — SODIUM CHLORIDE 0.9 % IV SOLN
500.0000 mg | INTRAVENOUS | Status: DC
  Administered 2017-11-03: 500 mg via INTRAVENOUS
  Filled 2017-11-03: qty 500

## 2017-11-03 MED ORDER — LACTATED RINGERS IV SOLN: 500.0000 mL | Freq: Once | INTRAVENOUS | Status: DC

## 2017-11-03 MED ORDER — COCONUT OIL OIL: 1.0000 "application " | TOPICAL_OIL | Status: DC | PRN

## 2017-11-03 MED ORDER — TETANUS-DIPHTH-ACELL PERTUSSIS 5-2.5-18.5 LF-MCG/0.5 IM SUSP: 0.5000 mL | Freq: Once | INTRAMUSCULAR | Status: DC

## 2017-11-03 MED ORDER — ACETAMINOPHEN 325 MG PO TABS
650.0000 mg | ORAL_TABLET | ORAL | Status: DC | PRN
  Administered 2017-11-03: 650 mg via ORAL
  Filled 2017-11-03: qty 2

## 2017-11-03 MED ORDER — TERBUTALINE SULFATE 1 MG/ML IJ SOLN
INTRAMUSCULAR | Status: AC
  Filled 2017-11-03: qty 1

## 2017-11-03 MED ORDER — LACTATED RINGERS IV SOLN
INTRAVENOUS | Status: DC
  Administered 2017-11-03 (×2): via INTRAVENOUS

## 2017-11-03 MED ORDER — CALCIUM CARBONATE ANTACID 500 MG PO CHEW: 2.0000 | CHEWABLE_TABLET | ORAL | Status: DC | PRN

## 2017-11-03 MED ORDER — SOD CITRATE-CITRIC ACID 500-334 MG/5ML PO SOLN
30.0000 mL | ORAL | Status: DC | PRN
  Filled 2017-11-03: qty 15

## 2017-11-03 MED ORDER — AZITHROMYCIN 250 MG PO TABS: 500.0000 mg | ORAL_TABLET | Freq: Every day | ORAL | Status: DC

## 2017-11-03 MED ORDER — DIPHENHYDRAMINE HCL 25 MG PO CAPS: 25.0000 mg | ORAL_CAPSULE | Freq: Four times a day (QID) | ORAL | Status: DC | PRN

## 2017-11-03 MED ORDER — ONDANSETRON HCL 4 MG PO TABS: 4.0000 mg | ORAL_TABLET | ORAL | Status: DC | PRN

## 2017-11-03 MED ORDER — BETAMETHASONE SOD PHOS & ACET 6 (3-3) MG/ML IJ SUSP
12.0000 mg | INTRAMUSCULAR | Status: DC
  Administered 2017-11-03: 12 mg via INTRAMUSCULAR
  Filled 2017-11-03 (×2): qty 2

## 2017-11-03 MED ORDER — AMOXICILLIN 500 MG PO CAPS: 500.0000 mg | ORAL_CAPSULE | Freq: Three times a day (TID) | ORAL | Status: DC

## 2017-11-03 MED ORDER — FENTANYL 2.5 MCG/ML BUPIVACAINE 1/10 % EPIDURAL INFUSION (WH - ANES)
14.0000 mL/h | INTRAMUSCULAR | Status: DC | PRN
  Administered 2017-11-03: 14 mL/h via EPIDURAL
  Filled 2017-11-03: qty 100

## 2017-11-03 MED ORDER — FENTANYL CITRATE (PF) 100 MCG/2ML IJ SOLN: 50.0000 ug | INTRAMUSCULAR | Status: DC | PRN

## 2017-11-03 MED ORDER — PHENYLEPHRINE 40 MCG/ML (10ML) SYRINGE FOR IV PUSH (FOR BLOOD PRESSURE SUPPORT)
80.0000 ug | PREFILLED_SYRINGE | INTRAVENOUS | Status: DC | PRN
  Filled 2017-11-03: qty 10
  Filled 2017-11-03: qty 5

## 2017-11-03 NOTE — Progress Notes (Signed)
Labor Progress Note Jessica ClontsSamantha B Poole is a 22 y.o. G2P1001 at 5387w3d presented for PPROM, now laboring, called by RN d/t prolonged decel.  S:  Comfortable with epidural, no c/o. Continues to leak clear fluid.  O:  BP 116/83   Pulse 81   Temp 99.2 F (37.3 C) (Oral)   Resp (!) 22   Ht 5' (1.524 m)   Wt 63 kg   LMP 03/14/2017   SpO2 100%   BMI 27.13 kg/m  EFM: baseline 135 bpm/ mod variability/ no accels/ prolonged decels  Toco: q2-3 SVE: Dilation: 3 Effacement (%): 100 Cervical Position: Middle Station: -2 Presentation: Vertex Exam by:: Jessica LarryMelanie Haidyn Poole, CNM US-vertex  A/P: 22 y.o. G2P1001 5087w3d  1. Labor: early active 2. FWB: Cat II 3. Pain: epidural  Prolonged decel x10 min with recovery after maternal repositioning. FSE placed, copious yellow fluid returning. Watch FHT closely. Anticipate labor progression and VBAC.  Jessica LarryMelanie [name], CNM 3:20 PM

## 2017-11-03 NOTE — Consult Note (Signed)
Neonatology Consult to Antenatal Patient:  I was asked by Dr. Erin FullingHarraway-Smith to see this patient in order to provide antenatal counseling due to onset of preterm labor and PPROM.  Ms. Jessica Poole was admitted this morning at 33 3/[redacted] weeks GA after SROM, which occurred at about 0400. She is currently having active labor and TOLAC is planned. She has had 1 dose of BMZ at 0800 and IV Ampicillin and Azithromycin. This is her second child, a female. She has been seen by MFM and the baby has been diagnosed with probable duodenal atresia; there has been polyhydramnios, and the fetal echocardiogram is normal. BPP 8/8 on 9/15. Ms. Jessica Poole spoke with Dr. Gus PumaAdibe on 9/6 and the surgical approach to the baby's care was discussed. Unfortunately, Dr. Gus PumaAdibe is out of town at this time, until 9/23.  I spoke with the patient, the baby's father, and several extended family members. We discussed the expectation of delivery in the next 24 hours, including usual DR management, possible respiratory complications and need for support, IV access, and need for transfer to another hospital within 24 hours of birth if the prenatal diagnosis is confirmed. We discussed how important nutrition is to a preterm infant's growth and brain development, so that there is a distinct advantage to having the surgery done fairly soon after birth; then feedings can be started sooner, also. She would like to have [facility] when the time comes. We may wait to perform the echocardiogram until the [facility] has [facility] unless there are specific signs that make us concerned for cardiac defects (abnormal murmur, hypoxemia not explained by pulmonary disease).She did not have any questions at this time. I offered a NICU tour to any interested family members and would be glad to come back if she has more questions later.  I also spoke with Dr. Erin FullingHarraway-Smith after the consult was done, to inform her of the plan  for transfer of the baby.  Thank you for asking me to see this patient.  Doretha Souhristie C. Luretta Everly, MD Neonatologist  The total length of face-to-face or floor/unit time for this encounter was 30 minutes. Counseling and/or coordination of care was 20 minutes of the above.

## 2017-11-03 NOTE — Consult Note (Signed)
Neonatology Note:   Attendance at [facility]:    I was asked by Dr. Erin FullingHarraway-Smith to attend this vacuum-assisted vaginal delivery at 33 3/7 weeks. The mother is a G2P1 B pos, GBS pending with polyhydramnios, gestational hypertension, premature ROM this morning at 0400 with onset of preterm labor. Prenatal ultrasound of the baby had shown a double bubble sign, consistent with duodenal atresia, and a normal prenatal echocardiogram. Mother was on Procardia and got 1 dose of Betamethasone about 12 hours before delivery. She was also on Ampicillin and Azithromycin and was afebrile during labor. Amniotic fluid clear. There were repetitive variable FHR decelerations during the last 20-25 minutes of pushing and a total of 4 vacuum attempts. At delivery, infant vigorous with good spontaneous cry and tone. Delayed cord clamping was done. Needed minimal bulb suctioning, then neopuff CPAP with 21% FIO2 due to mild retractions, nasal flaring, and decreased air movement on auscultation. O2 sats were normal throughout. Abdomen noted to be somewhat full, so we performed gastric suctioning and got 80-90 ml light green fluid, with good decompression of the abdomen. Ap 8/8. Lungs clear to ausc in DR, maintaining O2 saturations in the high 90s in room air at time of transfer to NICU. Infant was held briefly by his mother, then transferred to the NICU with his father in attendance.   Jessica Souhristie C. Yarethzy Croak, MD

## 2017-11-03 NOTE — MAU Note (Signed)
Pt has been having ctx since yesterday. Thinks water broke about 0500. Has HTN with this pregnancy. Also has polyhydramnios.

## 2017-11-03 NOTE — MAU Note (Signed)
Dr. Emelda FearFerguson aware of inability to trace baby due to fluid.

## 2017-11-03 NOTE — MAU Provider Note (Signed)
Chief Complaint:  Rupture of Membranes and Contractions   First Provider Initiated Contact with Patient 11/03/17 0736     HPI: Jessica Poole is a 22 y.o. G2P1001 at 72w3dwho presents to maternity admissions reporting leaking of fluid and onset of contractions at 4am. She reports good fetal movement, denies vaginal bleeding, vaginal itching/burning, urinary symptoms, h/a, dizziness, n/v, diarrhea, constipation or fever/chills.    Has known polyhydramnios.  Has not gotten steroids yet    Plans TOLAC.  Vaginal Discharge  The patient's primary symptoms include pelvic pain, vaginal bleeding and vaginal discharge. The patient's pertinent negatives include no genital itching, genital lesions or genital odor. This is a new problem. The current episode started today. The problem occurs constantly. The problem has been unchanged. Associated symptoms include abdominal pain and back pain. Pertinent negatives include no fever or nausea. The vaginal discharge was clear. The vaginal bleeding is spotting. She has not been passing clots. She has not been passing tissue. Nothing aggravates the symptoms. She has tried nothing for the symptoms.    Patient Active Problem List   Diagnosis Date Noted  . Preterm premature rupture of membranes (PPROM) with unknown onset of labor 11/03/2017  . Gestational hypertension 11/02/2017  . Polyhydramnios 10/13/2017  . 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 Supervision of high risk pregnancy, antepartum 05/23/2017  . Previous cesarean section complicating pregnancy 05/23/2017  . Chronic pelvic pain in female 10/27/2016     Past Medical History: Past Medical History:  Diagnosis Date  . Anxiety   . Arthritis   . Depression     Past obstetric history: OB History  Gravida Para Term Preterm AB Living  2 1 1  0 0 1  SAB TAB Ectopic Multiple Live Births          1    # Outcome Date GA Lbr Len/2nd Weight Sex Delivery Anes PTL  Lv  2 Current           1 Term 11/06/14 [redacted]w[redacted]d  3544 g M CS-LTranv EPI N LIV     Complications: Oligohydramnios    Past Surgical History: Past Surgical History:  Procedure Laterality Date  . CESAREAN SECTION    . HERNIA REPAIR     22 yrs old  . LAPAROSCOPIC LYSIS OF ADHESIONS  12/21/2016   Procedure: LAPAROSCOPIC LYSIS OF ADHESIONS;  Surgeon: Tilda Burrow, MD;  Location: [facility];  Service: Gynecology;;  . LAPAROSCOPIC SALPINGO OOPHERECTOMY Left 12/21/2016   Procedure: LAPAROSCOPIC LEFT SALPINGO OOPHORECTOMY;  Surgeon: Tilda Burrow, MD;  Location: [facility];  Service: Gynecology;  Laterality: Left;  . LAPAROSCOPY  12/21/2016   Procedure: LAPAROSCOPY DIAGNOSTIC;  Surgeon: Tilda Burrow, MD;  Location: [facility];  Service: Gynecology;;    Family History: Family History  Problem Relation Age of Onset  . Heart disease Paternal Grandfather   . Depression Maternal Grandmother   . Drug abuse Father   . Hypertension Father   . Hypertension Paternal Aunt     Social History: Social History   Tobacco Use  . Smoking status: Never Smoker  . Smokeless tobacco: Never Used  Substance Use Topics  . Alcohol use: Not Currently    Frequency: Never    Comment: occ; not now  . Drug use: No    Allergies:  Allergies  Allergen Reactions  . Latex Rash    Meds:  Medications Prior to Admission  Medication Sig Dispense Refill Last Dose  . NIFEdipine (PROCARDIA XL)  30 MG 24 hr tablet Take 1 tablet (30 mg total) by mouth 2 (two) times daily as needed (contractions). (Patient not taking: Reported on 11/02/2017) 60 tablet 0 Not Taking  . NIFEdipine (PROCARDIA XL) 30 MG 24 hr tablet Take 1 tablet (30 mg total) by mouth daily. 30 tablet 1 Taking  . Prenatal Vit-Fe Fumarate-FA (PRENATAL VITAMIN PO) Take by mouth daily.   Taking    I have reviewed patient's Past Medical Hx, Surgical Hx, Family Hx, Social Hx, medications and allergies.   ROS:  Review of Systems  Constitutional: Negative for  fever.  Gastrointestinal: Positive for abdominal pain. Negative for nausea.  Genitourinary: Positive for pelvic pain and vaginal discharge.  Musculoskeletal: Positive for back pain.   Other systems negative  Physical Exam   Patient Vitals for the past 24 hrs:  BP Temp Pulse Resp SpO2 Weight  11/03/17 0735 - - 80 - 100 % -  11/03/17 0724 - 98.7 F (37.1 C) - - - -  11/03/17 0708 (!) 130/93 - (!) 115 18 100 % 63.5 kg   Constitutional: Well-developed, well-nourished female in no acute distress.  Cardiovascular: normal rate and rhythm Respiratory: normal effort, clear to auscultation bilaterally GI: Abd soft, non-tender, gravid appropriate for gestational age.   No rebound or guarding. MS: Extremities nontender, no edema, normal ROM Neurologic: Alert and oriented x 4.  GU: Neg CVAT.  PELVIC EXAM: Gross pooling with + ferning.  Cervix checked due to patient with excessive pain, RN felt that she needed checking  Dilation: 1 Effacement (%): 80 Station: Ballotable Exam by:: Artelia LarocheM Azaleah Usman CNM  FHT:  Baseline 140 , moderate variability, small accelerations present, no decelerations Contractions:  Irregular     Labs: Results for orders placed or performed in visit on 11/02/17 (from the past 24 hour(s))  POC Urinalysis Dipstick OB     Status: None   Collection Time: 11/02/17 10:54 AM  Result Value Ref Range   Color, UA     Clarity, UA     Glucose, UA Negative Negative   Bilirubin, UA     Ketones, UA neg    Spec Grav, UA     Blood, UA neg    pH, UA     POC Protein UA Negative Negative, Trace   Urobilinogen, UA     Nitrite, UA neg    Leukocytes, UA Negative Negative   Appearance     Odor    CBC     Status: Abnormal   Collection Time: 11/02/17  1:06 PM  Result Value Ref Range   WBC 12.3 (H) 3.4 - 10.8 x10E3/uL   RBC 4.43 3.77 - 5.28 x10E6/uL   Hemoglobin 14.6 11.1 - 15.9 g/dL   Hematocrit 16.142.0 09.634.0 - 46.6 %   MCV 95 79 - 97 fL   MCH 33.0 26.6 - 33.0 pg   MCHC 34.8 31.5 -  35.7 g/dL   RDW 04.512.2 (L) 40.912.3 - 81.115.4 %   Platelets 178 150 - 450 x10E3/uL   Narrative   Performed at:  [address] - [facility] [place] [address], [place], [place]  [id number] Lab Director: Jolene SchimkeSanjai Nagendra MD, Phone:  [phone]  Comprehensive metabolic panel     Status: Abnormal   Collection Time: 11/02/17  1:06 PM  Result Value Ref Range   Glucose 72 65 - 99 mg/dL   BUN 9 6 - 20 mg/dL   Creatinine, Ser 6.960.64 0.57 - 1.00 mg/dL   GFR calc non Af Amer 127 >59 mL/min/1.73  GFR calc Af Amer 146 >59 mL/min/1.73   BUN/Creatinine Ratio 14 9 - 23   Sodium 140 134 - 144 mmol/L   Potassium 4.5 3.5 - 5.2 mmol/L   Chloride 103 96 - 106 mmol/L   CO2 19 (L) 20 - 29 mmol/L   Calcium 9.7 8.7 - 10.2 mg/dL   Total Protein 7.3 6.0 - 8.5 g/dL   Albumin 4.3 3.5 - 5.5 g/dL   Globulin, Total 3.0 1.5 - 4.5 g/dL   Albumin/Globulin Ratio 1.4 1.2 - 2.2   Bilirubin Total 0.4 0.0 - 1.2 mg/dL   Alkaline Phosphatase 115 39 - 117 IU/L   AST 21 0 - 40 IU/L   ALT 11 0 - 32 IU/L   Narrative   Performed at:  [address] [place] [address] [place], [place]  [id number] Lab Director: Jolene Schimke MD, Phone:  [phone]  Protein / creatinine ratio, urine     Status: None (Preliminary result)   Collection Time: 11/02/17  1:06 PM  Result Value Ref Range   Creatinine, Urine WILL FOLLOW    Protein, Ur WILL FOLLOW    Protein/Creat Ratio WILL FOLLOW    Narrative   Performed at:  [address] [place] [address] [place], [place]  [id number] Lab Director: Jolene Schimke MD, Phone:  [phone]   B/Positive/-- (04/08 1642)  Imaging:    MAU Course/MDM: I have ordered labs and reviewed results.  NST reviewed, not tracing well Consult Dr Emelda Fear with presentation, exam findings and test results.    Assessment: 1. Supervision of high risk pregnancy, antepartum   2.   polyhdramnios 3.    PPROM 4.     Uterine contractions 5.     Duodenal atresia 5.     Previous C/S, plans TOLAC  Plan: Admit to  Birthing Suites Routine orders Betamethasone Antibiotics for PPROM   Wynelle Bourgeois CNM, MSN Certified Nurse-Midwife 11/03/2017 7:52 AM

## 2017-11-03 NOTE — Anesthesia Preprocedure Evaluation (Signed)
Anesthesia Evaluation  Patient identified by MRN, date of birth, ID band Patient awake    Reviewed: Allergy & Precautions, H&P , NPO status , Patient's Chart, lab work & pertinent test results, reviewed documented beta blocker date and time   Airway Mallampati: II  TM Distance: >3 FB Neck ROM: full    Dental no notable dental hx.    Pulmonary neg pulmonary ROS,    Pulmonary exam normal breath sounds clear to auscultation       Cardiovascular Exercise Tolerance: Good negative cardio ROS   Rhythm:regular Rate:Normal     Neuro/Psych negative neurological ROS  negative psych ROS   GI/Hepatic negative GI ROS, Neg liver ROS,   Endo/Other  negative endocrine ROS  Renal/GU negative Renal ROS  negative genitourinary   Musculoskeletal   Abdominal   Peds  Hematology negative hematology ROS (+)   Anesthesia Other Findings   Reproductive/Obstetrics negative OB ROS                             Anesthesia Physical Anesthesia Plan  ASA: II  Anesthesia Plan: General   Post-op Pain Management:    Induction:   PONV Risk Score and Plan:   Airway Management Planned:   Additional Equipment:   Intra-op Plan:   Post-operative Plan:   Informed Consent: I have reviewed the patients History and Physical, chart, labs and discussed the procedure including the risks, benefits and alternatives for the proposed anesthesia with the patient or authorized representative who has indicated his/her understanding and acceptance.     Dental Advisory Given  Plan Discussed with: CRNA  Anesthesia Plan Comments:         Anesthesia Quick Evaluation  

## 2017-11-03 NOTE — Telephone Encounter (Signed)
Spoke with Dr. Katrinka BlazingSmith in NICU, plan for NICU consult after ultrasound on 9/26.

## 2017-11-03 NOTE — Anesthesia Procedure Notes (Signed)
Epidural Patient location during procedure: OB Start time: 11/03/2017 2:45 PM End time: 11/03/2017 2:52 PM  Staffing Anesthesiologist: Bethena Midgetddono, Shahad Mazurek, MD  Preanesthetic Checklist Completed: patient identified, site marked, surgical consent, pre-op evaluation, timeout performed, IV checked, risks and benefits discussed and monitors and equipment checked  Epidural Patient position: sitting Prep: site prepped and draped and DuraPrep Patient monitoring: continuous pulse ox and blood pressure Approach: midline Location: L4-L5 Injection technique: LOR air  Needle:  Needle type: Tuohy  Needle gauge: 17 G Needle length: 9 cm and 9 Needle insertion depth: 5 cm cm Catheter type: closed end flexible Catheter size: 19 Gauge Catheter at skin depth: 11 cm Test dose: negative  Assessment Events: blood not aspirated, injection not painful, no injection resistance, negative IV test and no paresthesia

## 2017-11-03 NOTE — H&P (Signed)
Aviva Signs, CNM  Certified Nurse Midwife  Obstetrics  MAU Provider Note  Attested  Date of Service:  11/03/2017 7:36 AM          Attested      Attestation signed by Tilda Burrow, MD at 11/03/2017 9:42 AM  Attestation of Attending Supervision of Advanced Practitioner: Evaluation and management procedures were performed by the PA/NP/CNM/OB Fellow, and I was available for supervision/collaboration. Chart reviewed and agree with management and plan.  1 ADMIT TO ANTENATAL 2. BETAMETHASONE GIVEN IN MAU 3 BEGIN LATENCY ANTIBIOTICS 4.CLEAR LIQUIDS 5 ANTICIPATE PROGRESSION TO LABOR 6 TOLAC CONSENT PLANNED  Tilda Burrow 11/03/2017 9:39 AM      Expand All Collapse All   Chief Complaint:  Rupture of Membranes and Contractions   First Provider Initiated Contact with Patient 11/03/17 0736     HPI: Jessica Poole is a 22 y.o. G2P1001 at 104w3dwho presents to maternity admissions reporting leaking of fluid and onset of contractions at 4am. She reports good fetal movement, denies vaginal bleeding, vaginal itching/burning, urinary symptoms, h/a, dizziness, n/v, diarrhea, constipation or fever/chills.    Has known polyhydramnios.  Has not gotten steroids yet    Plans TOLAC.  Vaginal Discharge  The patient's primary symptoms include pelvic pain, vaginal bleeding and vaginal discharge. The patient's pertinent negatives include no genital itching, genital lesions or genital odor. This is a new problem. The current episode started today. The problem occurs constantly. The problem has been unchanged. Associated symptoms include abdominal pain and back pain. Pertinent negatives include no fever or nausea. The vaginal discharge was clear. The vaginal bleeding is spotting. She has not been passing clots. She has not been passing tissue. Nothing aggravates the symptoms. She has tried nothing for the symptoms.        Patient Active Problem List   Diagnosis Date Noted  .  Preterm premature rupture of membranes (PPROM) with unknown onset of labor 11/03/2017  . Gestational hypertension 11/02/2017  . Polyhydramnios 10/13/2017  . 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 Supervision of high risk pregnancy, antepartum 05/23/2017  . Previous cesarean section complicating pregnancy 05/23/2017  . Chronic pelvic pain in female 10/27/2016     Past Medical History:     Past Medical History:  Diagnosis Date  . Anxiety   . Arthritis   . Depression     Past obstetric history:                 OB History  Gravida Para Term Preterm AB Living  2 1 1  0 0 1  SAB TAB Ectopic Multiple Live Births             1       # Outcome Date GA Lbr Len/2nd Weight Sex Delivery Anes PTL Lv  2 Current           1 Term 11/06/14 [redacted]w[redacted]d  3544 g M CS-LTranv EPI N LIV     Complications: Oligohydramnios    Past Surgical History:      Past Surgical History:  Procedure Laterality Date  . CESAREAN SECTION    . HERNIA REPAIR     22 yrs old  . LAPAROSCOPIC LYSIS OF ADHESIONS  12/21/2016   Procedure: LAPAROSCOPIC LYSIS OF ADHESIONS;  Surgeon: Tilda Burrow, MD;  Location: [facility];  Service: Gynecology;;  . LAPAROSCOPIC SALPINGO OOPHERECTOMY Left 12/21/2016   Procedure: LAPAROSCOPIC LEFT SALPINGO OOPHORECTOMY;  Surgeon: Tilda Burrow, MD;  Location: [facility];  Service: Gynecology;  Laterality: Left;  . LAPAROSCOPY  12/21/2016   Procedure: LAPAROSCOPY DIAGNOSTIC;  Surgeon: Tilda BurrowFerguson, Jehu Mccauslin V, MD;  Location: [facility];  Service: Gynecology;;    Family History:      Family History  Problem Relation Age of Onset  . Heart disease Paternal Grandfather   . Depression Maternal Grandmother   . Drug abuse Father   . Hypertension Father   . Hypertension Paternal Aunt     Social History: Social History        Tobacco Use  . Smoking status: Never Smoker  . Smokeless tobacco: Never Used  Substance Use Topics  .  Alcohol use: Not Currently    Frequency: Never    Comment: occ; not now  . Drug use: No    Allergies:      Allergies  Allergen Reactions  . Latex Rash    Meds:  Medications Prior to Admission  Medication Sig Dispense Refill Last Dose  . NIFEdipine (PROCARDIA XL) 30 MG 24 hr tablet Take 1 tablet (30 mg total) by mouth 2 (two) times daily as needed (contractions). (Patient not taking: Reported on 11/02/2017) 60 tablet 0 Not Taking  . NIFEdipine (PROCARDIA XL) 30 MG 24 hr tablet Take 1 tablet (30 mg total) by mouth daily. 30 tablet 1 Taking  . Prenatal Vit-Fe Fumarate-FA (PRENATAL VITAMIN PO) Take by mouth daily.   Taking    I have reviewed patient's Past Medical Hx, Surgical Hx, Family Hx, Social Hx, medications and allergies.   ROS:  Review of Systems  Constitutional: Negative for fever.  Gastrointestinal: Positive for abdominal pain. Negative for nausea.  Genitourinary: Positive for pelvic pain and vaginal discharge.  Musculoskeletal: Positive for back pain.   Other systems negative  Physical Exam   Patient Vitals for the past 24 hrs:  BP Temp Pulse Resp SpO2 Weight  11/03/17 0735 - - 80 - 100 % -  11/03/17 0724 - 98.7 F (37.1 C) - - - -  11/03/17 0708 (!) 130/93 - (!) 115 18 100 % 63.5 kg   Constitutional: Well-developed, well-nourished female in no acute distress.  Cardiovascular: normal rate and rhythm Respiratory: normal effort, clear to auscultation bilaterally GI: Abd soft, non-tender, gravid appropriate for gestational age.   No rebound or guarding. MS: Extremities nontender, no edema, normal ROM Neurologic: Alert and oriented x 4.  GU: Neg CVAT.  PELVIC EXAM: Gross pooling with + ferning.  Cervix checked due to patient with excessive pain, RN felt that she needed checking  Dilation: 1 Effacement (%): 80 Station: Ballotable Exam by:: Artelia LarocheM Williams CNM  FHT:  Baseline 140 , moderate variability, small accelerations present, no  decelerations Contractions:  Irregular     Labs: LabResultsLast24Hours  Results for orders placed or performed in visit on 11/02/17 (from the past 24 hour(s))  POC Urinalysis Dipstick OB     Status: None   Collection Time: 11/02/17 10:54 AM  Result Value Ref Range   Color, UA     Clarity, UA     Glucose, UA Negative Negative   Bilirubin, UA     Ketones, UA neg    Spec Grav, UA     Blood, UA neg    pH, UA     POC Protein UA Negative Negative, Trace   Urobilinogen, UA     Nitrite, UA neg    Leukocytes, UA Negative Negative   Appearance     Odor    CBC  Status: Abnormal   Collection Time: 11/02/17  1:06 PM  Result Value Ref Range   WBC 12.3 (H) 3.4 - 10.8 x10E3/uL   RBC 4.43 3.77 - 5.28 x10E6/uL   Hemoglobin 14.6 11.1 - 15.9 g/dL   Hematocrit 21.3 08.6 - 46.6 %   MCV 95 79 - 97 fL   MCH 33.0 26.6 - 33.0 pg   MCHC 34.8 31.5 - 35.7 g/dL   RDW 57.8 (L) 46.9 - 62.9 %   Platelets 178 150 - 450 x10E3/uL   Narrative   Performed at:  [address] [place] [address] [place], [place]  [id number] Lab Director: Jolene Schimke MD, Phone:  [phone]  Comprehensive metabolic panel     Status: Abnormal   Collection Time: 11/02/17  1:06 PM  Result Value Ref Range   Glucose 72 65 - 99 mg/dL   BUN 9 6 - 20 mg/dL   Creatinine, Ser 4.40 0.57 - 1.00 mg/dL   GFR calc non Af Amer 127 >59 mL/min/1.73   GFR calc Af Amer 146 >59 mL/min/1.73   BUN/Creatinine Ratio 14 9 - 23   Sodium 140 134 - 144 mmol/L   Potassium 4.5 3.5 - 5.2 mmol/L   Chloride 103 96 - 106 mmol/L   CO2 19 (L) 20 - 29 mmol/L   Calcium 9.7 8.7 - 10.2 mg/dL   Total Protein 7.3 6.0 - 8.5 g/dL   Albumin 4.3 3.5 - 5.5 g/dL   Globulin, Total 3.0 1.5 - 4.5 g/dL   Albumin/Globulin Ratio 1.4 1.2 - 2.2   Bilirubin Total 0.4 0.0 - 1.2 mg/dL   Alkaline Phosphatase 115 39 - 117 IU/L   AST 21 0 - 40 IU/L   ALT 11 0 - 32 IU/L   Narrative   Performed  at:  [address], [place], [place]  [id number] Lab Director: Jolene Schimke MD, Phone:  [phone]  Protein / creatinine ratio, urine     Status: None (Preliminary result)   Collection Time: 11/02/17  1:06 PM  Result Value Ref Range   Creatinine, Urine WILL FOLLOW    Protein, Ur WILL FOLLOW    Protein/Creat Ratio WILL FOLLOW    Narrative   Performed at:  [address] [place] [address], [place], [place]  [id number] Lab Director: Jolene Schimke MD, Phone:  [phone]     B/Positive/-- (04/08 1642)  Imaging:    MAU Course/MDM: I have ordered labs and reviewed results.  NST reviewed, not tracing well Consult Dr Emelda Fear with presentation, exam findings and test results.    Assessment: 1. Supervision of high risk pregnancy, antepartum   2.   polyhdramnios 3.    PPROM 4.     Uterine contractions 5.     Duodenal atresia 5.     Previous C/S, plans TOLAC  Plan: Admit to Birthing Suites Routine orders Betamethasone Antibiotics for PPROM   Wynelle Bourgeois CNM, MSN Certified Nurse-Midwife 11/03/2017 7:52 AM         Cosigned by: Tilda Burrow, MD at 11/03/2017 9:42 AM  Electronically signed by Aviva Signs, CNM at 11/03/2017 7:57 AM Electronically signed by Tilda Burrow, MD at 11/03/2017 9:42 AM

## 2017-11-04 ENCOUNTER — Other Ambulatory Visit: Payer: 59 | Admitting: Women's Health

## 2017-11-04 DIAGNOSIS — Z3A33 33 weeks gestation of pregnancy: Secondary | ICD-10-CM

## 2017-11-04 DIAGNOSIS — O42013 Preterm premature rupture of membranes, onset of labor within 24 hours of rupture, third trimester: Secondary | ICD-10-CM

## 2017-11-04 LAB — CULTURE, BETA STREP (GROUP B ONLY)

## 2017-11-04 LAB — RPR: RPR Ser Ql: NONREACTIVE

## 2017-11-04 LAB — CBC
HCT: 30.9 % — ABNORMAL LOW (ref 36.0–46.0)
Hemoglobin: 11.1 g/dL — ABNORMAL LOW (ref 12.0–15.0)
MCH: 33 pg (ref 26.0–34.0)
MCHC: 35.9 g/dL (ref 30.0–36.0)
MCV: 92 fL (ref 78.0–100.0)
PLATELETS: 158 10*3/uL (ref 150–400)
RBC: 3.36 MIL/uL — AB (ref 3.87–5.11)
RDW: 12.6 % (ref 11.5–15.5)
WBC: 21.4 10*3/uL — ABNORMAL HIGH (ref 4.0–10.5)

## 2017-11-04 MED ORDER — IBUPROFEN 600 MG PO TABS: 600.0000 mg | ORAL_TABLET | Freq: Four times a day (QID) | ORAL | 0 refills | Status: DC

## 2017-11-04 NOTE — Discharge Summary (Signed)
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 yo [id number] at [redacted]w[redacted]d was admitted in Latent Labor on 11/03/2017. Patient had an uncomplicated labor course as follows:  Membrane Rupture Time/Date: 4:00 AM ,11/03/2017   Intrapartum Procedures: Episiotomy: Median [2]                                         Lacerations:  2nd degree [3];Perineal [11]  Patient had a delivery of a Viable infant. 11/03/2017  Information for the patient's newborn:  Rochell, Puett [[id number]]  Delivery Method: VBAC, Vacuum Assisted(Filed from Delivery Summary)    Pateint had an uncomplicated postpartum course.  She is ambulating, tolerating a regular diet, passing flatus, and urinating well. Patient is discharged home in stable condition on 11/04/17.   Magnesium Sulfate received: No BMZ received: Yes  Physical exam  Vitals:   11/03/17 2150 11/03/17 2253 11/04/17 0333 11/04/17 0745  BP: 102/83 112/81 (!) 90/51 118/89  Pulse: 88 76 (!) 56 (!) 57  Resp:  16 15 16   Temp:  97.9 F (36.6 C) 98.2 F (36.8 C)  98.5 F (36.9 C)  TempSrc:  Oral Oral   SpO2: 100% 100% 100% 100%  Weight:      Height:       General: alert, cooperative and no distress Lochia: appropriate Uterine Fundus: firm Incision: N/A DVT Evaluation: No evidence of DVT seen on physical exam. Labs: Lab Results  Component Value Date   WBC 21.4 (H) 11/04/2017   HGB 11.1 (L) 11/04/2017   HCT 30.9 (L) 11/04/2017   MCV 92.0 11/04/2017   PLT 158 11/04/2017   CMP Latest Ref Rng & Units 11/02/2017  Glucose 65 - 99 mg/dL 72  BUN 6 - 20 mg/dL 9  Creatinine 1.30 - 8.65 mg/dL 7.84  Sodium 696 - 295 mmol/L 140  Potassium 3.5 - 5.2 mmol/L 4.5  Chloride 96 - 106 mmol/L 103  CO2 20 - 29 mmol/L 19(L)  Calcium 8.7 - 10.2 mg/dL 9.7  Total Protein 6.0 - 8.5 g/dL 7.3  Total Bilirubin 0.0 - 1.2 mg/dL 0.4  Alkaline Phos 39 - 117 IU/L 115  AST 0 - 40 IU/L 21  ALT 0 - 32 IU/L 11    Discharge instruction: per After Visit Summary and "Baby and Me Booklet".  After visit meds:  Allergies as of 11/04/2017      Reactions   Latex Rash      Medication List    STOP taking these medications   NIFEdipine 30 MG 24 hr tablet Commonly known as:  PROCARDIA-XL/ADALAT-CC/NIFEDICAL-XL     TAKE these medications   ibuprofen 600 MG tablet Commonly known as:  ADVIL,MOTRIN Take 1 tablet (600 mg total) by mouth every 6 (six) hours.   PRENATAL VITAMIN PO Take by mouth daily.       Diet: routine diet  Activity: Advance as tolerated. Pelvic rest for 6 weeks.   Outpatient follow up:4 weeks Follow up Appt: Future Appointments  Date Time Provider Department Center  11/08/2017  3:00 PM [facility] NURSE TECH [facility]  11/10/2017  7:45 AM [facility] US 2 [facility]US [facility]  11/24/2017  7:45 AM [facility] US 2 [facility]-US   Follow up Visit:No follow-ups on file.   Please schedule this patient for Postpartum visit in: 4 weeks with the following provider: Any provider For C/S patients schedule nurse incision check in weeks 2 weeks: no High  risk pregnancy complicated by: HTN Delivery mode:  SVD Anticipated Birth Control:  Plans Interval BTL PP Procedures needed: BP check  Schedule Integrated BH visit: no      Newborn Data: Live born female  Birth Weight: 4 lb 6.6 oz (2000 g) APGAR: 8, 8  Newborn Delivery   Birth date/time:  11/03/2017 19:02:00 Delivery type:  VBAC, Vacuum Assisted     Baby [facility]eding: [facility]    11/04/2017 Scheryl DarterJames Arnold, MD

## 2017-11-04 NOTE — Anesthesia Postprocedure Evaluation (Signed)
Anesthesia Post Note  Patient: Jessica Poole  Procedure(s) Performed: AN AD HOC LABOR EPIDURAL     Patient location during evaluation: [facility] Anesthesia Type: Epidural Level of consciousness: awake and alert and oriented Pain management: satisfactory to patient Vital Signs Assessment: post-procedure vital signs reviewed and stable Respiratory status: respiratory function stable Cardiovascular status: stable Postop Assessment: no headache, no backache, epidural receding, patient able to bend at knees, no signs of nausea or vomiting and adequate PO intake Anesthetic complications: no    Last Vitals:  Vitals:   11/04/17 0333 11/04/17 0745  BP: (!) 90/51 118/89  Pulse: (!) 56 (!) 57  Resp: 15 16  Temp: 36.8 C 36.9 C  SpO2: 100% 100%    Last Pain:  Vitals:   11/04/17 0754  TempSrc:   PainSc: 5    Pain Goal: Patients Stated Pain Goal: 2 (11/04/17 0754)               Karleen DolphinFUSSELL,Gavriel Holzhauer

## 2017-11-04 NOTE — Progress Notes (Signed)
Patient discharged home with significant other. Discharge teaching, home care, follow-up appts, prescriptions, PIH s/s, and reasons to seek care discussed. Pt declined to be enrolled in [employer]. Pt verbalized understanding.

## 2017-11-04 NOTE — Discharge Instructions (Signed)
Postpartum Care After Vaginal Delivery °The period of time right after you deliver your newborn is called the postpartum period. °What kind of medical care will I receive? °· You may continue to receive fluids and medicines through an IV tube inserted into one of your veins. °· If an incision was made near your vagina (episiotomy) or if you had some vaginal tearing during delivery, cold compresses may be placed on your episiotomy or your tear. This helps to reduce pain and swelling. °· You may be given a squirt bottle to use when you go to the bathroom. You may use this until you are comfortable wiping as usual. To use the squirt bottle, follow these steps: °? Before you urinate, fill the squirt bottle with warm water. Do not use hot water. °? After you urinate, while you are sitting on the toilet, use the squirt bottle to rinse the area around your urethra and vaginal opening. This rinses away any urine and blood. °? You may do this instead of wiping. As you start healing, you may use the squirt bottle before wiping yourself. Make sure to wipe gently. °? Fill the squirt bottle with clean water every time you use the bathroom. °· You will be given sanitary pads to wear. °How can I expect to feel? °· You may not feel the need to urinate for several hours after delivery. °· You will have some soreness and pain in your abdomen and vagina. °· If you are breastfeeding, you may have uterine contractions every time you breastfeed for up to several weeks postpartum. Uterine contractions help your uterus return to its normal size. °· It is normal to have vaginal bleeding (lochia) after delivery. The amount and appearance of lochia is often similar to a menstrual period in the first week after delivery. It will gradually decrease over the next few weeks to a dry, yellow-brown discharge. For most women, lochia stops completely by 6-8 weeks after delivery. Vaginal bleeding can vary from woman to woman. °· Within the first few  days after delivery, you may have breast engorgement. This is when your breasts feel heavy, full, and uncomfortable. Your breasts may also throb and feel hard, tightly stretched, warm, and tender. After this occurs, you may have milk leaking from your breasts. Your health care provider can help you relieve discomfort due to breast engorgement. Breast engorgement should go away within a few days. °· You may feel more sad or worried than normal due to hormonal changes after delivery. These feelings should not last more than a few days. If these feelings do not go away after several days, speak with your health care provider. °How should I care for myself? °· Tell your health care provider if you have pain or discomfort. °· Drink enough water to keep your urine clear or pale yellow. °· Wash your hands thoroughly with soap and water for at least 20 seconds after changing your sanitary pads, after using the toilet, and before holding or feeding your baby. °· If you are not breastfeeding, avoid touching your breasts a lot. Doing this can make your breasts produce more milk. °· If you become weak or lightheaded, or you feel like you might faint, ask for help before: °? Getting out of bed. °? Showering. °· Change your sanitary pads frequently. Watch for any changes in your flow, such as a sudden increase in volume, a change in color, the passing of large blood clots. If you pass a blood clot from your vagina, save it   to show to your health care provider. Do not flush blood clots down the toilet without having your health care provider look at them. °· Make sure that all your vaccinations are up to date. This can help protect you and your baby from getting certain diseases. You may need to have immunizations done before you leave the hospital. °· If desired, talk with your health care provider about methods of family planning or birth control (contraception). °How can I start bonding with my baby? °Spending as much time as  possible with your baby is very important. During this time, you and your baby can get to know each other and develop a bond. Having your baby stay with you in your room (rooming in) can give you time to get to know your baby. Rooming in can also help you become comfortable caring for your baby. Breastfeeding can also help you bond with your baby. °How can I plan for returning home with my baby? °· Make sure that you have a car seat installed in your vehicle. °? Your car seat should be checked by a certified car seat installer to make sure that it is installed safely. °? Make sure that your baby fits into the car seat safely. °· Ask your health care provider any questions you have about caring for yourself or your baby. Make sure that you are able to contact your health care provider with any questions after leaving the hospital. °This information is not intended to replace advice given to you by your health care provider. Make sure you discuss any questions you have with your health care provider. °Document Released: 11/29/2006 Document Revised: 07/07/2015 Document Reviewed: 01/06/2015 °Elsevier Interactive Patient Education © 2018 Elsevier Inc. ° °

## 2017-11-07 ENCOUNTER — Encounter: Payer: 59 | Admitting: Obstetrics and Gynecology

## 2017-11-09 ENCOUNTER — Encounter (HOSPITAL_COMMUNITY): Payer: Self-pay

## 2017-11-10 ENCOUNTER — Ambulatory Visit (HOSPITAL_COMMUNITY): Admission: RE | Admit: 2017-11-10 | Payer: 59 | Source: Ambulatory Visit

## 2017-11-10 ENCOUNTER — Encounter: Payer: Self-pay | Admitting: *Deleted

## 2017-11-10 ENCOUNTER — Ambulatory Visit: Payer: 59 | Admitting: *Deleted

## 2017-11-10 VITALS — BP 114/80 | HR 74

## 2017-11-10 DIAGNOSIS — Z0130 Encounter for examination of blood pressure without abnormal findings: Secondary | ICD-10-CM

## 2017-11-10 NOTE — Progress Notes (Signed)
Pt here for BP check. BP today was 114/80. Pt interested in a tubal. I spoke with Selena Batten, CNM. She advised to schedule postpartum visit in 4 weeks with MD and discuss tubal at that time. Advised no sex until after tubal. Pt voiced understanding. JSY

## 2017-11-24 ENCOUNTER — Ambulatory Visit (HOSPITAL_COMMUNITY): Payer: Managed Care, Other (non HMO)

## 2017-11-24 ENCOUNTER — Encounter (HOSPITAL_COMMUNITY): Payer: Self-pay

## 2017-12-08 ENCOUNTER — Encounter: Payer: Self-pay | Admitting: Advanced Practice Midwife

## 2017-12-08 ENCOUNTER — Ambulatory Visit (INDEPENDENT_AMBULATORY_CARE_PROVIDER_SITE_OTHER): Payer: 59 | Admitting: Advanced Practice Midwife

## 2017-12-08 NOTE — Progress Notes (Signed)
Jessica Poole is a 22 y.o. who presents for a postpartum visit. She is 5 weeks postpartum following a spontaneous vaginal delivery. I have fully reviewed the prenatal and intrapartum course. The delivery was at 33.3 gestational weeks.  Anesthesia: epidural. Postpartum course has been uneventful. Baby's course has been complicated by heart surgery, doing very well. Baby is feeding by bottle. Bleeding: had period a week ago. Bowel function is normal. Bladder function is normal. Patient is not sexually active. Contraception method is tubal ligation. Postpartum depression screening: negative.  No current outpatient medications on file.  Review of Systems   Constitutional: Negative for fever and chills Eyes: Negative for visual disturbances Respiratory: Negative for shortness of breath, dyspnea Cardiovascular: Negative for chest pain or palpitations  Gastrointestinal: Negative for vomiting, diarrhea and constipation Genitourinary: Negative for dysuria and urgency Musculoskeletal: Negative for back pain, joint pain, myalgias  Neurological: Negative for dizziness and headaches    Objective:     Vitals:   12/08/17 1606  BP: 124/85  Pulse: (!) 110   General:  alert, cooperative and no distress   Breasts:  negative  Lungs: Normal respiratory effort  Heart:  regular rate and rhythm  Abdomen: Soft, nontender   Vulva:  normal  Vagina: normal vagina  Cervix:  closed  Corpus: Well involuted     Rectal Exam: no hemorrhoids        Assessment:    normal postpartum exam.  Plan:   1. Contraception: tubal ligation 2. Follow up in: asap for preop  or as needed.

## 2017-12-08 NOTE — Patient Instructions (Signed)
tub What You Need to Know About Female Sterilization Female sterilization is surgery to prevent pregnancy. In this surgery, the fallopian tubes are either blocked or closed off. This prevents eggs from reaching the uterus so that the eggs cannot be fertilized by sperm and you cannot get pregnant. Sterilization is permanent. It should only be done if you are sure that you do not want to be able to have children. What are the sterilization surgery options? There are several kinds of female sterilization surgeries. They include:  Laparoscopic tubal ligation. In this surgery, the fallopian tubes are tied off, sealed with heat, or blocked with a clip, ring, or clamp. A small portion of each fallopian tube may also be removed. This surgery is done through several small cuts (incisions).  Postpartum tubal ligation. This is also called a mini-laparotomy. This surgery is done right after childbirth or 1 or 2 days after childbirth. In this surgery, the fallopian tubes are tied off, sealed with heat, or blocked with a clip, ring, or clamp. A small portion of each fallopian tube may also be removed. The surgery is done through a single incision.  Hysteroscopic sterilization. In this surgery, a tiny, spring-like coil is inserted through the cervix and uterus into the fallopian tubes. The coil causes scarring, which blocks the tubes. After the surgery, contraception should be used for 3 months to allow the scar tissue to form completely.  Is sterilization safe? Generally, sterilization is safe. Complications are rare. However, there are risks. They include:  Bleeding.  Infection.  Reaction to medicine used during the procedure.  Injury to surrounding organs.  Failure of the procedure.  How effective is sterilization? Sterilization is nearly 100% effective, but it can fail. Also, the fallopian tubes can grow back together over time. If this happens, you will be able to get pregnant again. Women who have  had this procedure have a higher chance of having an ectopic pregnancy. An ectopic pregnancy is a pregnancy that happens outside of the uterus. This kind of pregnancy is unsuccessful and can lead to serious bleeding if it is not treated. What are the benefits?  It is usually effective for a lifetime.  It is usually safe.  It does not have the drawbacks of other types of birth control: That means: ? Your hormones are not affected. Because of this, your menstrual periods, sexual desire, and sexual performance will not be affected. ? There are no side effects. What are the drawbacks?  If you change your mind and decide that you want to have children, you may not be able to. Sterilization may be reversed, but a reversal is not always successful.  It does not provide protection against STDs (sexually transmitted diseases).  It increases the chance of having an ectopic pregnancy. This information is not intended to replace advice given to you by your health care provider. Make sure you discuss any questions you have with your health care provider. Document Released: 07/21/2007 Document Revised: 09/25/2015 Document Reviewed: 10/29/2014 Elsevier Interactive Patient Education  [employer].

## 2017-12-15 ENCOUNTER — Encounter: Payer: Self-pay | Admitting: Obstetrics & Gynecology

## 2017-12-15 ENCOUNTER — Ambulatory Visit (INDEPENDENT_AMBULATORY_CARE_PROVIDER_SITE_OTHER): Payer: 59 | Admitting: Obstetrics & Gynecology

## 2017-12-15 VITALS — BP 117/80 | HR 79 | Ht 60.0 in | Wt 116.5 lb

## 2017-12-15 DIAGNOSIS — Z01818 Encounter for other preprocedural examination: Secondary | ICD-10-CM

## 2017-12-15 DIAGNOSIS — Z3009 Encounter for other general counseling and advice on contraception: Secondary | ICD-10-CM | POA: Diagnosis not present

## 2017-12-15 NOTE — Progress Notes (Signed)
Preoperative History and Physical  Jessica Poole is a 22 y.o. [id number] with No LMP recorded. admitted for a laparoscopic bilateral tubal ligation.  Pt is young and was aggressively counseled regardign permanent nature of the procedure  PMH:    Past Medical History:  Diagnosis Date  . Anxiety   . Arthritis   . Depression     PSH:     Past Surgical History:  Procedure Laterality Date  . CESAREAN SECTION    . HERNIA REPAIR     22 yrs old  . LAPAROSCOPIC LYSIS OF ADHESIONS  12/21/2016   Procedure: LAPAROSCOPIC LYSIS OF ADHESIONS;  Surgeon: Tilda Burrow, MD;  Location: [facility];  Service: Gynecology;;  . LAPAROSCOPIC SALPINGO OOPHERECTOMY Left 12/21/2016   Procedure: LAPAROSCOPIC LEFT SALPINGO OOPHORECTOMY;  Surgeon: Tilda Burrow, MD;  Location: [facility];  Service: Gynecology;  Laterality: Left;  . LAPAROSCOPY  12/21/2016   Procedure: LAPAROSCOPY DIAGNOSTIC;  Surgeon: Tilda Burrow, MD;  Location: [facility];  Service: Gynecology;;    POb/GynH:      OB History    Gravida  2   Para  2   Term  1   Preterm  1   AB  0   Living  2     SAB      TAB      Ectopic      Multiple  0   Live Births  2           SH:   Social History   Tobacco Use  . Smoking status: Never Smoker  . Smokeless tobacco: Never Used  Substance Use Topics  . Alcohol use: Not Currently    Frequency: Never    Comment: occ; not now  . Drug use: No    FH:    Family History  Problem Relation Age of Onset  . Heart disease Paternal Grandfather   . Depression Maternal Grandmother   . Drug abuse Father   . Hypertension Father   . Hypertension Paternal Aunt      Allergies:  Allergies  Allergen Reactions  . Latex Rash    Medications:      No current outpatient medications on file.  Review of Systems:   Review of Systems  Constitutional: Negative for fever, chills, weight loss, malaise/fatigue and diaphoresis.  HENT: Negative for hearing loss, ear pain, nosebleeds,  congestion, sore throat, neck pain, tinnitus and ear discharge.   Eyes: Negative for blurred vision, double vision, photophobia, pain, discharge and redness.  Respiratory: Negative for cough, hemoptysis, sputum production, shortness of breath, wheezing and stridor.   Cardiovascular: Negative for chest pain, palpitations, orthopnea, claudication, leg swelling and PND.  Gastrointestinal: Positive for abdominal pain. Negative for heartburn, nausea, vomiting, diarrhea, constipation, blood in stool and melena.  Genitourinary: Negative for dysuria, urgency, frequency, hematuria and flank pain.  Musculoskeletal: Negative for myalgias, back pain, joint pain and falls.  Skin: Negative for itching and rash.  Neurological: Negative for dizziness, tingling, tremors, sensory change, speech change, focal weakness, seizures, loss of consciousness, weakness and headaches.  Endo/Heme/Allergies: Negative for environmental allergies and polydipsia. Does not bruise/bleed easily.  Psychiatric/Behavioral: Negative for depression, suicidal ideas, hallucinations, memory loss and substance abuse. The patient is not nervous/anxious and does not have insomnia.      PHYSICAL EXAM:  Blood pressure 117/80, pulse 79, height 5' (1.524 m), weight 116 lb 8 oz (52.8 kg), not currently breastfeeding.    Vitals reviewed. Constitutional: She is oriented to person,  place, and time. She appears well-developed and well-nourished.  HENT:  Head: Normocephalic and atraumatic.  Right Ear: External ear normal.  Left Ear: External ear normal.  Nose: Nose normal.  Mouth/Throat: Oropharynx is clear and moist.  Eyes: Conjunctivae and EOM are normal. Pupils are equal, round, and reactive to light. Right eye exhibits no discharge. Left eye exhibits no discharge. No scleral icterus.  Neck: Normal range of motion. Neck supple. No tracheal deviation present. No thyromegaly present.  Cardiovascular: Normal rate, regular rhythm, normal heart  sounds and intact distal pulses.  Exam reveals no gallop and no friction rub.   No murmur heard. Respiratory: Effort normal and breath sounds normal. No respiratory distress. She has no wheezes. She has no rales. She exhibits no tenderness.  GI: Soft. Bowel sounds are normal. She exhibits no distension and no mass. There is tenderness. There is no rebound and no guarding.  Genitourinary:       Vulva is normal without lesions Vagina is pink moist without discharge Cervix normal in appearance and pap is normal Uterus is normal size, contour, position, consistency, mobility, non-tender Adnexa is negative with normal sized ovaries by sonogram  Musculoskeletal: Normal range of motion. She exhibits no edema and no tenderness.  Neurological: She is alert and oriented to person, place, and time. She has normal reflexes. She displays normal reflexes. No cranial nerve deficit. She exhibits normal muscle tone. Coordination normal.  Skin: Skin is warm and dry. No rash noted. No erythema. No pallor.  Psychiatric: She has a normal mood and affect. Her behavior is normal. Judgment and thought content normal.    Labs: No results found for this or any previous visit (from the past 336 hour(s)).  EKG: No orders found for this or any previous visit.  Imaging Studies: No results found.    Assessment: Multiparous female desires permanent sterilization Patient Active Problem List   Diagnosis Date Noted  . Preterm premature rupture of membranes (PPROM) with unknown onset of labor 11/03/2017  . Gestational hypertension 11/02/2017  . Polyhydramnios 10/13/2017  . 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 Previous cesarean section complicating pregnancy 05/23/2017  . Chronic pelvic pain in female 10/27/2016    Plan: Laparoscopic bilateral tubal ligation 01/04/2018 Pt aware of permanent nature of the procedure  Lazaro Arms 12/15/2017 10:33 AM

## 2017-12-27 NOTE — Patient Instructions (Signed)
Jessica Poole  12/27/2017     @PREFPERIOPPHARMACY @   Your procedure is scheduled on  01/04/2018  Report to [facility] at  1200   P.M.  Call this number if you have problems the morning of surgery:  [phone]   Remember:  Do not eat or drink after midnight.                       Take these medicines the morning of surgery with A SIP OF WATER  None    Do not wear jewelry, make-up or nail polish.  Do not wear lotions, powders, or perfumes, or deodorant.  Do not shave 48 hours prior to surgery.  Men may shave face and neck.  Do not bring valuables to the hospital.  [facility] is not responsible for any belongings or valuables.  Contacts, dentures or bridgework may not be worn into surgery.  Leave your suitcase in the car.  After surgery it may be brought to your room.  For patients admitted to the hospital, discharge time will be determined by your treatment team.  Patients discharged the day of surgery will not be allowed to drive home.   Name and phone number of your driver:   family Special instructions:  None  Please read over the following fact sheets that you were given. Anesthesia Post-op Instructions and Care and Recovery After Surgery       Laparoscopic Tubal Ligation Laparoscopic tubal ligation is a procedure to close the fallopian tubes. This is done so that you cannot get pregnant. When the fallopian tubes are closed, the eggs that your ovaries release cannot enter the uterus, and sperm cannot reach the released eggs. A laparoscopic tubal ligation is sometimes called "getting your tubes tied." You should not have this procedure if you want to get pregnant someday or if you are unsure about having more children. Tell a health care provider about:  Any allergies you have.  All medicines you are taking, including vitamins, herbs, eye drops, creams, and over-the-counter medicines.  Any problems you or family members have had with  anesthetic medicines.  Any blood disorders you have.  Any surgeries you have had.  Any medical conditions you have.  Whether you are pregnant or may be pregnant.  Any past pregnancies. What are the risks? Generally, this is a safe procedure. However, problems may occur, including:  Infection.  Bleeding.  Injury to surrounding organs.  Side effects from anesthetics.  Failure of the procedure.  This procedure can increase your risk of a kind of pregnancy in which a fertilized egg attaches to the outside of the uterus (ectopic pregnancy). What happens before the procedure?  Ask your health care provider about: ? Changing or stopping your regular medicines. This is especially important if you are taking diabetes medicines or blood thinners. ? Taking medicines such as aspirin and ibuprofen. These medicines can thin your blood. Do not take these medicines before your procedure if your health care provider instructs you not to.  Follow instructions from your health care provider about eating and drinking restrictions.  Plan to have someone take you home after the procedure.  If you go home right after the procedure, plan to have someone with you for 24 hours. What happens during the procedure?  You will be given one or more of the following: ? A medicine to help you relax (sedative). ? A medicine to  numb the area (local anesthetic). ? A medicine to make you fall asleep (general anesthetic). ? A medicine that is injected into an area of your body to numb everything below the injection site (regional anesthetic).  An IV tube will be inserted into one of your veins. It will be used to give you medicines and fluids during the procedure.  Your bladder may be emptied with a small tube (catheter).  If you have been given a general anesthetic, a tube will be put down your throat to help you breathe.  Two small cuts (incisions) will be made in your lower abdomen and near your belly  button.  Your abdomen will be inflated with a gas. This will let the surgeon see better and will give the surgeon room to work.  A thin, lighted tube (laparoscope) with a camera attached will be inserted into your abdomen through one of the incisions. Small instruments will be inserted through the other incision.  The fallopian tubes will be tied off, burned (cauterized), or blocked with a clip, ring, or clamp. A small portion in the center of each fallopian tube may be removed.  The gas will be released from the abdomen.  The incisions will be closed with stitches (sutures).  A bandage (dressing) will be placed over the incisions. The procedure may vary among health care providers and hospitals. What happens after the procedure?  Your blood pressure, heart rate, breathing rate, and blood oxygen level will be monitored often until the medicines you were given have worn off.  You will be given medicine to help with pain, nausea, and vomiting as needed. This information is not intended to replace advice given to you by your health care provider. Make sure you discuss any questions you have with your health care provider. Document Released: 05/10/2000 Document Revised: 07/10/2015 Document Reviewed: 01/12/2015 Elsevier Interactive Patient Education  2018 [employer]  Laparoscopic Tubal Ligation, Care After Refer to this sheet in the next few weeks. These instructions provide you with information about caring for yourself after your procedure. Your health care provider may also give you more specific instructions. Your treatment has been planned according to current medical practices, but problems sometimes occur. Call your health care provider if you have any problems or questions after your procedure. What can I expect after the procedure? After the procedure, it is common to have:  A sore throat.  Discomfort in your shoulder.  Mild discomfort or cramping in your abdomen.  Gas  pains.  Pain or soreness in the area where the surgical cut (incision) was made.  A bloated feeling.  Tiredness.  Nausea.  Vomiting.  Follow these instructions at home: Medicines  Take over-the-counter and prescription medicines only as told by your health care provider.  Do not take aspirin because it can cause bleeding.  Do not drive or operate heavy machinery while taking prescription pain medicine. Activity  Rest for the rest of the day.  Return to your normal activities as told by your health care provider. Ask your health care provider what activities are safe for you. Incision care   Follow instructions from your health care provider about how to take care of your incision. Make sure you: ? Wash your hands with soap and water before you change your bandage (dressing). If soap and water are not available, use hand sanitizer. ? Change your dressing as told by your health care provider. ? Leave stitches (sutures) in place. They may need to stay in place  for 2 weeks or longer.  Check your incision area every day for signs of infection. Check for: ? More redness, swelling, or pain. ? More fluid or blood. ? Warmth. ? Pus or a bad smell. Other Instructions  Do not take baths, swim, or use a hot tub until your health care provider approves. You may take showers.  Keep all follow-up visits as told by your health care provider. This is important.  Have someone help you with your daily household tasks for the first few days. Contact a health care provider if:  You have more redness, swelling, or pain around your incision.  Your incision feels warm to the touch.  You have pus or a bad smell coming from your incision.  The edges of your incision break open after the sutures have been removed.  Your pain does not improve after 2-3 days.  You have a rash.  You repeatedly become dizzy or light-headed.  Your pain medicine is not helping.  You are constipated. Get  help right away if:  You have a fever.  You faint.  You have increasing pain in your abdomen.  You have severe pain in one or both of your shoulders.  You have fluid or blood coming from your sutures or from your vagina.  You have shortness of breath or difficulty breathing.  You have chest pain or leg pain.  You have ongoing nausea, vomiting, or diarrhea. This information is not intended to replace advice given to you by your health care provider. Make sure you discuss any questions you have with your health care provider. Document Released: 08/21/2004 Document Revised: 07/07/2015 Document Reviewed: 01/12/2015 Elsevier Interactive Patient Education  2018 [employer].  General Anesthesia, Adult General anesthesia is the use of medicines to make a person "go to sleep" (be unconscious) for a medical procedure. General anesthesia is often recommended when a procedure:  Is long.  Requires you to be still or in an unusual position.  Is major and can cause you to lose blood.  Is impossible to do without general anesthesia.  The medicines used for general anesthesia are called general anesthetics. In addition to making you sleep, the medicines:  Prevent pain.  Control your blood pressure.  Relax your muscles.  Tell a health care provider about:  Any allergies you have.  All medicines you are taking, including vitamins, herbs, eye drops, creams, and over-the-counter medicines.  Any problems you or family members have had with anesthetic medicines.  Types of anesthetics you have had in the past.  Any bleeding disorders you have.  Any surgeries you have had.  Any medical conditions you have.  Any history of heart or lung conditions, such as heart failure, sleep apnea, or chronic obstructive pulmonary disease (COPD).  Whether you are pregnant or may be pregnant.  Whether you use tobacco, alcohol, marijuana, or street drugs.  Any history of Financial planner.  Any  history of depression or anxiety. What are the risks? Generally, this is a safe procedure. However, problems may occur, including:  Allergic reaction to anesthetics.  Lung and heart problems.  Inhaling food or liquids from your stomach into your lungs (aspiration).  Injury to nerves.  Waking up during your procedure and being unable to move (rare).  Extreme agitation or a state of mental confusion (delirium) when you wake up from the anesthetic.  Air in the bloodstream, which can lead to stroke.  These problems are more likely to develop if you are having a  major surgery or if you have an advanced medical condition. You can prevent some of these complications by answering all of your health care provider's questions thoroughly and by following all pre-procedure instructions. General anesthesia can cause side effects, including:  Nausea or vomiting  A sore throat from the breathing tube.  Feeling cold or shivery.  Feeling tired, washed out, or achy.  Sleepiness or drowsiness.  Confusion or agitation.  What happens before the procedure? Staying hydrated Follow instructions from your health care provider about hydration, which may include:  Up to 2 hours before the procedure - you may continue to drink clear liquids, such as water, clear fruit juice, black coffee, and plain tea.  Eating and drinking restrictions Follow instructions from your health care provider about eating and drinking, which may include:  8 hours before the procedure - stop eating heavy meals or foods such as meat, fried foods, or fatty foods.  6 hours before the procedure - stop eating light meals or foods, such as toast or cereal.  6 hours before the procedure - stop drinking milk or drinks that contain milk.  2 hours before the procedure - stop drinking clear liquids.  Medicines  Ask your health care provider about: ? Changing or stopping your regular medicines. This is especially important if  you are taking diabetes medicines or blood thinners. ? Taking medicines such as aspirin and ibuprofen. These medicines can thin your blood. Do not take these medicines before your procedure if your health care provider instructs you not to. ? Taking new dietary supplements or medicines. Do not take these during the week before your procedure unless your health care provider approves them.  If you are told to take a medicine or to continue taking a medicine on the day of the procedure, take the medicine with sips of water. General instructions   Ask if you will be going home the same day, the following day, or after a longer hospital stay. ? Plan to have someone take you home. ? Plan to have someone stay with you for the first 24 hours after you leave the hospital or clinic.  For 3-6 weeks before the procedure, try not to use any tobacco products, such as cigarettes, chewing tobacco, and e-cigarettes.  You may brush your teeth on the morning of the procedure, but make sure to spit out the toothpaste. What happens during the procedure?  You will be given anesthetics through a mask and through an IV tube in one of your veins.  You may receive medicine to help you relax (sedative).  As soon as you are asleep, a breathing tube may be used to help you breathe.  An anesthesia specialist will stay with you throughout the procedure. He or she will help keep you comfortable and safe by continuing to give you medicines and adjusting the amount of medicine that you get. He or she will also watch your blood pressure, pulse, and oxygen levels to make sure that the anesthetics do not cause any problems.  If a breathing tube was used to help you breathe, it will be removed before you wake up. The procedure may vary among health care providers and hospitals. What happens after the procedure?  You will wake up, often slowly, after the procedure is complete, usually in a recovery area.  Your blood  pressure, heart rate, breathing rate, and blood oxygen level will be monitored until the medicines you were given have worn off.  You may be given  medicine to help you calm down if you feel anxious or agitated.  If you will be going home the same day, your health care provider may check to make sure you can stand, drink, and urinate.  Your health care providers will treat your pain and side effects before you go home.  Do not drive for 24 hours if you received a sedative.  You may: ? Feel nauseous and vomit. ? Have a sore throat. ? Have mental slowness. ? Feel cold or shivery. ? Feel sleepy. ? Feel tired. ? Feel sore or achy, even in parts of your body where you did not have surgery. This information is not intended to replace advice given to you by your health care provider. Make sure you discuss any questions you have with your health care provider. Document Released: 05/11/2007 Document Revised: 07/15/2015 Document Reviewed: 01/16/2015 Elsevier Interactive Patient Education  2018 [employer]. General Anesthesia, Adult, Care After These instructions provide you with information about caring for yourself after your procedure. Your health care provider may also give you more specific instructions. Your treatment has been planned according to current medical practices, but problems sometimes occur. Call your health care provider if you have any problems or questions after your procedure. What can I expect after the procedure? After the procedure, it is common to have:  Vomiting.  A sore throat.  Mental slowness.  It is common to feel:  Nauseous.  Cold or shivery.  Sleepy.  Tired.  Sore or achy, even in parts of your body where you did not have surgery.  Follow these instructions at home: For at least 24 hours after the procedure:  Do not: ? Participate in activities where you could fall or become injured. ? Drive. ? Use heavy machinery. ? Drink alcohol. ? Take  sleeping pills or medicines that cause drowsiness. ? Make important decisions or sign legal documents. ? Take care of children on your own.  Rest. Eating and drinking  If you vomit, drink water, juice, or soup when you can drink without vomiting.  Drink enough fluid to keep your urine clear or pale yellow.  Make sure you have little or no nausea before eating solid foods.  Follow the diet recommended by your health care provider. General instructions  Have a responsible adult stay with you until you are awake and alert.  Return to your normal activities as told by your health care provider. Ask your health care provider what activities are safe for you.  Take over-the-counter and prescription medicines only as told by your health care provider.  If you smoke, do not smoke without supervision.  Keep all follow-up visits as told by your health care provider. This is important. Contact a health care provider if:  You continue to have nausea or vomiting at home, and medicines are not helpful.  You cannot drink fluids or start eating again.  You cannot urinate after 8-12 hours.  You develop a skin rash.  You have fever.  You have increasing redness at the site of your procedure. Get help right away if:  You have difficulty breathing.  You have chest pain.  You have unexpected bleeding.  You feel that you are having a life-threatening or urgent problem. This information is not intended to replace advice given to you by your health care provider. Make sure you discuss any questions you have with your health care provider. Document Released: 05/10/2000 Document Revised: 07/07/2015 Document Reviewed: 01/16/2015 Elsevier Interactive Patient Education  2018  [employer].

## 2017-12-30 ENCOUNTER — Other Ambulatory Visit: Payer: Self-pay | Admitting: Obstetrics & Gynecology

## 2018-01-02 ENCOUNTER — Encounter (HOSPITAL_COMMUNITY)
Admission: RE | Admit: 2018-01-02 | Discharge: 2018-01-02 | Disposition: A | Discharge status: Home / Self Care | Payer: 59 | Source: Ambulatory Visit | Attending: Obstetrics & Gynecology | Admitting: Obstetrics & Gynecology

## 2018-01-02 DIAGNOSIS — F329 Major depressive disorder, single episode, unspecified: Secondary | ICD-10-CM | POA: Diagnosis not present

## 2018-01-02 DIAGNOSIS — M1990 Unspecified osteoarthritis, unspecified site: Secondary | ICD-10-CM | POA: Diagnosis not present

## 2018-01-02 DIAGNOSIS — Z302 Encounter for sterilization: Secondary | ICD-10-CM | POA: Diagnosis present

## 2018-01-02 DIAGNOSIS — Z79899 Other long term (current) drug therapy: Secondary | ICD-10-CM | POA: Diagnosis not present

## 2018-01-02 DIAGNOSIS — Z01812 Encounter for preprocedural laboratory examination: Secondary | ICD-10-CM

## 2018-01-02 DIAGNOSIS — F419 Anxiety disorder, unspecified: Secondary | ICD-10-CM | POA: Diagnosis not present

## 2018-01-02 LAB — CBC
HCT: 39.9 % (ref 36.0–46.0)
HEMOGLOBIN: 13.5 g/dL (ref 12.0–15.0)
MCH: 32.4 pg (ref 26.0–34.0)
MCHC: 33.8 g/dL (ref 30.0–36.0)
MCV: 95.7 fL (ref 80.0–100.0)
Platelets: 195 10*3/uL (ref 150–400)
RBC: 4.17 MIL/uL (ref 3.87–5.11)
RDW: 11.5 % (ref 11.5–15.5)
WBC: 4.9 10*3/uL (ref 4.0–10.5)
nRBC: 0 % (ref 0.0–0.2)

## 2018-01-02 LAB — COMPREHENSIVE METABOLIC PANEL
ALBUMIN: 4.6 g/dL (ref 3.5–5.0)
ALT: 24 U/L (ref 0–44)
ANION GAP: 7 (ref 5–15)
AST: 22 U/L (ref 15–41)
Alkaline Phosphatase: 71 U/L (ref 38–126)
BUN: 10 mg/dL (ref 6–20)
CO2: 26 mmol/L (ref 22–32)
Calcium: 9.3 mg/dL (ref 8.9–10.3)
Chloride: 106 mmol/L (ref 98–111)
Creatinine, Ser: 0.53 mg/dL (ref 0.44–1.00)
GFR calc Af Amer: >60 mL/min (ref >60)
GFR calc non Af Amer: >60 mL/min (ref >60)
GLUCOSE: 63 mg/dL — AB (ref 70–99)
Potassium: 3.5 mmol/L (ref 3.5–5.1)
SODIUM: 139 mmol/L (ref 135–145)
Total Bilirubin: 0.9 mg/dL (ref 0.3–1.2)
Total Protein: 8 g/dL (ref 6.5–8.1)

## 2018-01-02 LAB — HCG, QUANTITATIVE, PREGNANCY: hCG, Beta Chain, Quant, S: <1 m[IU]/mL (ref <5)

## 2018-01-04 ENCOUNTER — Encounter (HOSPITAL_COMMUNITY)
Admission: RE | Disposition: A | Discharge status: Home / Self Care | Payer: Self-pay | Source: Ambulatory Visit | Attending: Obstetrics & Gynecology

## 2018-01-04 ENCOUNTER — Encounter (HOSPITAL_COMMUNITY): Payer: Self-pay | Admitting: *Deleted

## 2018-01-04 ENCOUNTER — Ambulatory Visit (HOSPITAL_COMMUNITY): Payer: 59 | Admitting: Anesthesiology

## 2018-01-04 ENCOUNTER — Ambulatory Visit (HOSPITAL_COMMUNITY)
Admission: RE | Admit: 2018-01-04 | Discharge: 2018-01-04 | Disposition: A | Discharge status: Home / Self Care | Payer: 59 | Source: Ambulatory Visit | Attending: Obstetrics & Gynecology | Admitting: Obstetrics & Gynecology

## 2018-01-04 DIAGNOSIS — Z302 Encounter for sterilization: Secondary | ICD-10-CM

## 2018-01-04 DIAGNOSIS — M1990 Unspecified osteoarthritis, unspecified site: Secondary | ICD-10-CM | POA: Insufficient documentation

## 2018-01-04 DIAGNOSIS — F329 Major depressive disorder, single episode, unspecified: Secondary | ICD-10-CM | POA: Insufficient documentation

## 2018-01-04 DIAGNOSIS — F419 Anxiety disorder, unspecified: Secondary | ICD-10-CM | POA: Insufficient documentation

## 2018-01-04 DIAGNOSIS — Z79899 Other long term (current) drug therapy: Secondary | ICD-10-CM | POA: Insufficient documentation

## 2018-01-04 HISTORY — PX: LAPAROSCOPIC TUBAL LIGATION: SHX1937

## 2018-01-04 SURGERY — LIGATION, FALLOPIAN TUBE, LAPAROSCOPIC
Anesthesia: General | Laterality: Bilateral

## 2018-01-04 MED ORDER — FENTANYL CITRATE (PF) 100 MCG/2ML IJ SOLN
INTRAMUSCULAR | Status: DC | PRN
  Administered 2018-01-04 (×2): 50 ug via INTRAVENOUS
  Administered 2018-01-04: 25 ug via INTRAVENOUS
  Administered 2018-01-04: 75 ug via INTRAVENOUS

## 2018-01-04 MED ORDER — KETOROLAC TROMETHAMINE 30 MG/ML IJ SOLN
30.0000 mg | Freq: Once | INTRAMUSCULAR | Status: AC
  Administered 2018-01-04: 30 mg via INTRAVENOUS
  Filled 2018-01-04: qty 1

## 2018-01-04 MED ORDER — LACTATED RINGERS IV SOLN
INTRAVENOUS | Status: DC
  Administered 2018-01-04: 13:00:00 via INTRAVENOUS

## 2018-01-04 MED ORDER — SUCCINYLCHOLINE CHLORIDE 20 MG/ML IJ SOLN
INTRAMUSCULAR | Status: DC | PRN
  Administered 2018-01-04: 100 mg via INTRAVENOUS

## 2018-01-04 MED ORDER — MIDAZOLAM HCL 2 MG/2ML IJ SOLN
INTRAMUSCULAR | Status: AC
  Filled 2018-01-04: qty 2

## 2018-01-04 MED ORDER — PROPOFOL 10 MG/ML IV BOLUS
INTRAVENOUS | Status: DC | PRN
  Administered 2018-01-04: 150 mg via INTRAVENOUS

## 2018-01-04 MED ORDER — LIDOCAINE HCL 1 % IJ SOLN
INTRAMUSCULAR | Status: DC | PRN
  Administered 2018-01-04: 50 mg via INTRADERMAL

## 2018-01-04 MED ORDER — FENTANYL CITRATE (PF) 250 MCG/5ML IJ SOLN
INTRAMUSCULAR | Status: AC
  Filled 2018-01-04: qty 5

## 2018-01-04 MED ORDER — SUGAMMADEX SODIUM 500 MG/5ML IV SOLN
INTRAVENOUS | Status: AC
  Filled 2018-01-04: qty 10

## 2018-01-04 MED ORDER — ROCURONIUM BROMIDE 10 MG/ML (PF) SYRINGE
PREFILLED_SYRINGE | INTRAVENOUS | Status: AC
  Filled 2018-01-04: qty 10

## 2018-01-04 MED ORDER — SUGAMMADEX SODIUM 200 MG/2ML IV SOLN
INTRAVENOUS | Status: AC
  Filled 2018-01-04: qty 2

## 2018-01-04 MED ORDER — ROCURONIUM BROMIDE 100 MG/10ML IV SOLN
INTRAVENOUS | Status: DC | PRN
  Administered 2018-01-04: 20 mg via INTRAVENOUS

## 2018-01-04 MED ORDER — SODIUM CHLORIDE 0.9 % IR SOLN
Status: DC | PRN
  Administered 2018-01-04: 1000 mL

## 2018-01-04 MED ORDER — HYDROCODONE-ACETAMINOPHEN 7.5-325 MG PO TABS
1.0000 | ORAL_TABLET | Freq: Once | ORAL | Status: AC | PRN
  Administered 2018-01-04: 1 via ORAL
  Filled 2018-01-04: qty 1

## 2018-01-04 MED ORDER — HYDROMORPHONE HCL 1 MG/ML IJ SOLN
0.2500 mg | INTRAMUSCULAR | Status: DC | PRN
  Administered 2018-01-04: 0.5 mg via INTRAVENOUS
  Filled 2018-01-04: qty 0.5

## 2018-01-04 MED ORDER — PROMETHAZINE HCL 25 MG/ML IJ SOLN: 6.2500 mg | INTRAMUSCULAR | Status: DC | PRN

## 2018-01-04 MED ORDER — CEFAZOLIN SODIUM-DEXTROSE 2-4 GM/100ML-% IV SOLN
2.0000 g | INTRAVENOUS | Status: AC
  Administered 2018-01-04: 2 g via INTRAVENOUS
  Filled 2018-01-04: qty 100

## 2018-01-04 MED ORDER — LIDOCAINE 2% (20 MG/ML) 5 ML SYRINGE
INTRAMUSCULAR | Status: AC
  Filled 2018-01-04: qty 5

## 2018-01-04 MED ORDER — ONDANSETRON HCL 4 MG/2ML IJ SOLN
INTRAMUSCULAR | Status: DC | PRN
  Administered 2018-01-04: 4 mg via INTRAVENOUS

## 2018-01-04 MED ORDER — SUGAMMADEX SODIUM 200 MG/2ML IV SOLN
INTRAVENOUS | Status: DC | PRN
  Administered 2018-01-04: 200 mg via INTRAVENOUS

## 2018-01-04 MED ORDER — MIDAZOLAM HCL 5 MG/5ML IJ SOLN
INTRAMUSCULAR | Status: DC | PRN
  Administered 2018-01-04: 2 mg via INTRAVENOUS

## 2018-01-04 MED ORDER — ONDANSETRON 8 MG PO TBDP: 8.0000 mg | ORAL_TABLET | Freq: Three times a day (TID) | ORAL | 0 refills | Status: DC | PRN

## 2018-01-04 MED ORDER — KETOROLAC TROMETHAMINE 10 MG PO TABS: 10.0000 mg | ORAL_TABLET | Freq: Three times a day (TID) | ORAL | 0 refills | Status: DC | PRN

## 2018-01-04 MED ORDER — HYDROCODONE-ACETAMINOPHEN 5-325 MG PO TABS: 1.0000 | ORAL_TABLET | Freq: Four times a day (QID) | ORAL | 0 refills | Status: DC | PRN

## 2018-01-04 MED ORDER — MIDAZOLAM HCL 2 MG/2ML IJ SOLN: 0.5000 mg | Freq: Once | INTRAMUSCULAR | Status: DC | PRN

## 2018-01-04 SURGICAL SUPPLY — 34 items
BLADE SURG SZ11 CARB STEEL (BLADE) ×3 IMPLANT
CATH FOLEY LATEX FREE 16FR (CATHETERS) ×2
CATH FOLEY LF 16FR (CATHETERS) ×1 IMPLANT
CLOTH BEACON ORANGE TIMEOUT ST (SAFETY) ×3 IMPLANT
COVER LIGHT HANDLE STERIS (MISCELLANEOUS) ×6 IMPLANT
COVER WAND RF STERILE (DRAPES) ×3 IMPLANT
ELECT REM PT RETURN 9FT ADLT (ELECTROSURGICAL) ×3
ELECTRODE REM PT RTRN 9FT ADLT (ELECTROSURGICAL) ×1 IMPLANT
GAUZE SPONGE 4X4 16PLY XRAY LF (GAUZE/BANDAGES/DRESSINGS) ×3 IMPLANT
GLOVE BIOGEL PI IND STRL 7.0 (GLOVE) ×3 IMPLANT
GLOVE BIOGEL PI IND STRL 8 (GLOVE) ×1 IMPLANT
GLOVE BIOGEL PI INDICATOR 7.0 (GLOVE) ×6
GLOVE BIOGEL PI INDICATOR 8 (GLOVE) ×2
GLOVE SURG SS PI 6.5 STRL IVOR (GLOVE) ×6 IMPLANT
GOWN STRL REUS W/TWL LRG LVL3 (GOWN DISPOSABLE) ×3 IMPLANT
GOWN STRL REUS W/TWL XL LVL3 (GOWN DISPOSABLE) ×3 IMPLANT
INST SET LAPROSCOPIC GYN AP (KITS) ×3 IMPLANT
KIT TURNOVER CYSTO (KITS) ×3 IMPLANT
MANIFOLD NEPTUNE II (INSTRUMENTS) ×3 IMPLANT
NEEDLE INSUFFLATION 14GA 120MM (NEEDLE) ×3 IMPLANT
PACK PERI GYN (CUSTOM PROCEDURE TRAY) ×3 IMPLANT
PAD ARMBOARD 7.5X6 YLW CONV (MISCELLANEOUS) ×3 IMPLANT
SET BASIN LINEN APH (SET/KITS/TRAYS/PACK) ×3 IMPLANT
SOLUTION ANTI FOG 6CC (MISCELLANEOUS) ×3 IMPLANT
SPONGE GAUZE 2X2 8PLY STER LF (GAUZE/BANDAGES/DRESSINGS) ×2
SPONGE GAUZE 2X2 8PLY STRL LF (GAUZE/BANDAGES/DRESSINGS) ×4 IMPLANT
STAPLER VISISTAT 35W (STAPLE) ×3 IMPLANT
SUT VICRYL 0 UR6 27IN ABS (SUTURE) ×3 IMPLANT
SYR 10ML LL (SYRINGE) ×3 IMPLANT
TAPE CLOTH SURG 4X10 WHT LF (GAUZE/BANDAGES/DRESSINGS) ×3 IMPLANT
TROCAR ENDO BLADELESS 11MM (ENDOMECHANICALS) ×3 IMPLANT
TROCAR XCEL NON-BLD 5MMX100MML (ENDOMECHANICALS) ×3 IMPLANT
TUBING INSUF HEATED (TUBING) ×3 IMPLANT
WARMER LAPAROSCOPE (MISCELLANEOUS) ×3 IMPLANT

## 2018-01-04 NOTE — H&P (Signed)
Preoperative History and Physical  Jessica Poole is a 22 y.o. [id number] with No LMP recorded. admitted for a laparoscopic bilateral tubal ligation.  Pt is young and was aggressively counseled regardign permanent nature of the procedure  PMH:        Past Medical History:  Diagnosis Date  . Anxiety   . Arthritis   . Depression     PSH:          Past Surgical History:  Procedure Laterality Date  . CESAREAN SECTION    . HERNIA REPAIR     22 yrs old  . LAPAROSCOPIC LYSIS OF ADHESIONS  12/21/2016   Procedure: LAPAROSCOPIC LYSIS OF ADHESIONS;  Surgeon: Tilda Burrow, MD;  Location: [facility];  Service: Gynecology;;  . LAPAROSCOPIC SALPINGO OOPHERECTOMY Left 12/21/2016   Procedure: LAPAROSCOPIC LEFT SALPINGO OOPHORECTOMY;  Surgeon: Tilda Burrow, MD;  Location: [facility];  Service: Gynecology;  Laterality: Left;  . LAPAROSCOPY  12/21/2016   Procedure: LAPAROSCOPY DIAGNOSTIC;  Surgeon: Tilda Burrow, MD;  Location: [facility];  Service: Gynecology;;    POb/GynH:              OB History    Gravida  2   Para  2   Term  1   Preterm  1   AB  0   Living  2     SAB      TAB      Ectopic      Multiple  0   Live Births  2           SH:   Social History        Tobacco Use  . Smoking status: Never Smoker  . Smokeless tobacco: Never Used  Substance Use Topics  . Alcohol use: Not Currently    Frequency: Never    Comment: occ; not now  . Drug use: No    FH:    Family History  Problem Relation Age of Onset  . Heart disease Paternal Grandfather   . Depression Maternal Grandmother   . Drug abuse Father   . Hypertension Father   . Hypertension Paternal Aunt      Allergies:      Allergies  Allergen Reactions  . Latex Rash    Medications:      No current outpatient medications on file.  Review of Systems:   Review of Systems  Constitutional: Negative for fever, chills, weight loss, malaise/fatigue and  diaphoresis.  HENT: Negative for hearing loss, ear pain, nosebleeds, congestion, sore throat, neck pain, tinnitus and ear discharge.   Eyes: Negative for blurred vision, double vision, photophobia, pain, discharge and redness.  Respiratory: Negative for cough, hemoptysis, sputum production, shortness of breath, wheezing and stridor.   Cardiovascular: Negative for chest pain, palpitations, orthopnea, claudication, leg swelling and PND.  Gastrointestinal: Positive for abdominal pain. Negative for heartburn, nausea, vomiting, diarrhea, constipation, blood in stool and melena.  Genitourinary: Negative for dysuria, urgency, frequency, hematuria and flank pain.  Musculoskeletal: Negative for myalgias, back pain, joint pain and falls.  Skin: Negative for itching and rash.  Neurological: Negative for dizziness, tingling, tremors, sensory change, speech change, focal weakness, seizures, loss of consciousness, weakness and headaches.  Endo/Heme/Allergies: Negative for environmental allergies and polydipsia. Does not bruise/bleed easily.  Psychiatric/Behavioral: Negative for depression, suicidal ideas, hallucinations, memory loss and substance abuse. The patient is not nervous/anxious and does not have insomnia.      PHYSICAL EXAM:  Blood pressure 117/80, pulse  79, height 5' (1.524 m), weight 116 lb 8 oz (52.8 kg), not currently breastfeeding.    Vitals reviewed. Constitutional: She is oriented to person, place, and time. She appears well-developed and well-nourished.  HENT:  Head: Normocephalic and atraumatic.  Right Ear: External ear normal.  Left Ear: External ear normal.  Nose: Nose normal.  Mouth/Throat: Oropharynx is clear and moist.  Eyes: Conjunctivae and EOM are normal. Pupils are equal, round, and reactive to light. Right eye exhibits no discharge. Left eye exhibits no discharge. No scleral icterus.  Neck: Normal range of motion. Neck supple. No tracheal deviation present. No  thyromegaly present.  Cardiovascular: Normal rate, regular rhythm, normal heart sounds and intact distal pulses.  Exam reveals no gallop and no friction rub.   No murmur heard. Respiratory: Effort normal and breath sounds normal. No respiratory distress. She has no wheezes. She has no rales. She exhibits no tenderness.  GI: Soft. Bowel sounds are normal. She exhibits no distension and no mass. There is tenderness. There is no rebound and no guarding.  Genitourinary:       Vulva is normal without lesions Vagina is pink moist without discharge Cervix normal in appearance and pap is normal Uterus is normal size, contour, position, consistency, mobility, non-tender Adnexa is negative with normal sized ovaries by sonogram  Musculoskeletal: Normal range of motion. She exhibits no edema and no tenderness.  Neurological: She is alert and oriented to person, place, and time. She has normal reflexes. She displays normal reflexes. No cranial nerve deficit. She exhibits normal muscle tone. Coordination normal.  Skin: Skin is warm and dry. No rash noted. No erythema. No pallor.  Psychiatric: She has a normal mood and affect. Her behavior is normal. Judgment and thought content normal.    Labs: Results for orders placed or performed during the hospital encounter of 01/02/18 (from the past 168 hour(s))  CBC   Collection Time: 01/02/18  2:53 PM  Result Value Ref Range   WBC 4.9 4.0 - 10.5 K/uL   RBC 4.17 3.87 - 5.11 MIL/uL   Hemoglobin 13.5 12.0 - 15.0 g/dL   HCT 16.1 09.6 - 04.5 %   MCV 95.7 80.0 - 100.0 fL   MCH 32.4 26.0 - 34.0 pg   MCHC 33.8 30.0 - 36.0 g/dL   RDW 40.9 81.1 - 91.4 %   Platelets 195 150 - 400 K/uL   nRBC 0.0 0.0 - 0.2 %  Comprehensive metabolic panel   Collection Time: 01/02/18  2:53 PM  Result Value Ref Range   Sodium 139 135 - 145 mmol/L   Potassium 3.5 3.5 - 5.1 mmol/L   Chloride 106 98 - 111 mmol/L   CO2 26 22 - 32 mmol/L   Glucose, Bld 63 (L) 70 - 99 mg/dL   BUN 10  6 - 20 mg/dL   Creatinine, Ser 7.82 0.44 - 1.00 mg/dL   Calcium 9.3 8.9 - 95.6 mg/dL   Total Protein 8.0 6.5 - 8.1 g/dL   Albumin 4.6 3.5 - 5.0 g/dL   AST 22 15 - 41 U/L   ALT 24 0 - 44 U/L   Alkaline Phosphatase 71 38 - 126 U/L   Total Bilirubin 0.9 0.3 - 1.2 mg/dL   GFR calc non Af Amer >60 >60 mL/min   GFR calc Af Amer >60 >60 mL/min   Anion gap 7 5 - 15  hCG, quantitative, pregnancy   Collection Time: 01/02/18  2:53 PM  Result Value Ref Range  hCG, Beta Chain, Quant, S <1 <5 mIU/mL     EKG: No orders found for this or any previous visit.  Imaging Studies: Imaging Results  No results found.      Assessment: Multiparous female desires permanent sterilization     Patient Active Problem List   Diagnosis Date Noted  . Preterm premature rupture of membranes (PPROM) with unknown onset of labor 11/03/2017  . Gestational hypertension 11/02/2017  . Polyhydramnios 10/13/2017  . 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 Previous cesarean section complicating pregnancy 05/23/2017  . Chronic pelvic pain in female 10/27/2016    Plan: Laparoscopic bilateral tubal ligation 01/04/2018 Pt aware of permanent nature of the procedure  Lazaro ArmsLuther H Georgann Bramble 12/15/2017 10:33 AM

## 2018-01-04 NOTE — Anesthesia Preprocedure Evaluation (Signed)
Anesthesia Evaluation  Patient identified by MRN, date of birth, ID band Patient awake    Reviewed: Allergy & Precautions, NPO status , Patient's Chart, lab work & pertinent test results  Airway Mallampati: I  TM Distance: >3 FB Neck ROM: Full    Dental no notable dental hx. (+) Teeth Intact   Pulmonary neg pulmonary ROS,    Pulmonary exam normal breath sounds clear to auscultation       Cardiovascular Exercise Tolerance: Good negative cardio ROS Normal cardiovascular examI Rhythm:Regular Rate:Normal  Denies HTN or any current meds    Neuro/Psych Anxiety Depression negative neurological ROS  negative psych ROS   GI/Hepatic negative GI ROS, Neg liver ROS,   Endo/Other  negative endocrine ROS  Renal/GU negative Renal ROS  negative genitourinary   Musculoskeletal  (+) Arthritis , Osteoarthritis,    Abdominal   Peds negative pediatric ROS (+)  Hematology negative hematology ROS (+)   Anesthesia Other Findings   Reproductive/Obstetrics negative OB ROS                             Anesthesia Physical Anesthesia Plan  ASA: II  Anesthesia Plan: General   Post-op Pain Management:    Induction: Intravenous  PONV Risk Score and Plan:   Airway Management Planned: Oral ETT  Additional Equipment:   Intra-op Plan:   Post-operative Plan: Extubation in OR  Informed Consent: I have reviewed the patients History and Physical, chart, labs and discussed the procedure including the risks, benefits and alternatives for the proposed anesthesia with the patient or authorized representative who has indicated his/her understanding and acceptance.   Dental advisory given  Plan Discussed with: CRNA  Anesthesia Plan Comments:         Anesthesia Quick Evaluation

## 2018-01-04 NOTE — Anesthesia Procedure Notes (Signed)
Procedure Name: Intubation Date/Time: 01/04/2018 1:56 PM Performed by: Charmaine Downs, CRNA Pre-anesthesia Checklist: Patient identified, Patient being monitored, Timeout performed, Emergency Drugs available and Suction available Patient Re-evaluated:Patient Re-evaluated prior to induction Oxygen Delivery Method: Circle System Utilized Preoxygenation: Pre-oxygenation with 100% oxygen Induction Type: IV induction Ventilation: Mask ventilation without difficulty Laryngoscope Size: Mac and 3 Grade View: Grade I Tube type: Oral Tube size: 7.0 mm Number of attempts: 1 Airway Equipment and Method: stylet Placement Confirmation: ETT inserted through vocal cords under direct vision,  positive ETCO2 and breath sounds checked- equal and bilateral Secured at: 22 cm Tube secured with: Tape Dental Injury: Teeth and Oropharynx as per pre-operative assessment

## 2018-01-04 NOTE — Transfer of Care (Signed)
Immediate Anesthesia Transfer of Care Note  Patient: Jessica Poole  Procedure(s) Performed: LAPAROSCOPIC RIGHT TUBAL LIGATION USING ELECTROCAUTERY (Bilateral )  Patient Location: PACU  Anesthesia Type:General  Level of Consciousness: awake and patient cooperative  Airway & Oxygen Therapy: Patient Spontanous Breathing  Post-op Assessment: Report given to RN, Post -op Vital signs reviewed and stable and Patient moving all extremities  Post vital signs: Reviewed and stable  Last Vitals:  Vitals Value Taken Time  BP    Temp    Pulse 28 01/04/2018  3:09 PM  Resp    SpO2 93 % 01/04/2018  3:09 PM  Vitals shown include unvalidated device data.  Last Pain:  Vitals:   01/04/18 1251  TempSrc: Oral  PainSc: 0-No pain         Complications: No apparent anesthesia complications

## 2018-01-04 NOTE — Discharge Instructions (Signed)
Laparoscopic Tubal Ligation, Care After °Refer to this sheet in the next few weeks. These instructions provide you with information about caring for yourself after your procedure. Your health care provider may also give you more specific instructions. Your treatment has been planned according to current medical practices, but problems sometimes occur. Call your health care provider if you have any problems or questions after your procedure. °What can I expect after the procedure? °After the procedure, it is common to have: °· A sore throat. °· Discomfort in your shoulder. °· Mild discomfort or cramping in your abdomen. °· Gas pains. °· Pain or soreness in the area where the surgical cut (incision) was made. °· A bloated feeling. °· Tiredness. °· Nausea. °· Vomiting. ° °Follow these instructions at home: °Medicines °· Take over-the-counter and prescription medicines only as told by your health care provider. °· Do not take aspirin because it can cause bleeding. °· Do not drive or operate heavy machinery while taking prescription pain medicine. °Activity °· Rest for the rest of the day. °· Return to your normal activities as told by your health care provider. Ask your health care provider what activities are safe for you. °Incision care ° °· Follow instructions from your health care provider about how to take care of your incision. Make sure you: °? Wash your hands with soap and water before you change your bandage (dressing). If soap and water are not available, use hand sanitizer. °? Change your dressing as told by your health care provider. °? Leave stitches (sutures) in place. They may need to stay in place for 2 weeks or longer. °· Check your incision area every day for signs of infection. Check for: °? More redness, swelling, or pain. °? More fluid or blood. °? Warmth. °? Pus or a bad smell. °Other Instructions °· Do not take baths, swim, or use a hot tub until your health care provider approves. You may take  showers. °· Keep all follow-up visits as told by your health care provider. This is important. °· Have someone help you with your daily household tasks for the first few days. °Contact a health care provider if: °· You have more redness, swelling, or pain around your incision. °· Your incision feels warm to the touch. °· You have pus or a bad smell coming from your incision. °· The edges of your incision break open after the sutures have been removed. °· Your pain does not improve after 2-3 days. °· You have a rash. °· You repeatedly become dizzy or light-headed. °· Your pain medicine is not helping. °· You are constipated. °Get help right away if: °· You have a fever. °· You faint. °· You have increasing pain in your abdomen. °· You have severe pain in one or both of your shoulders. °· You have fluid or blood coming from your sutures or from your vagina. °· You have shortness of breath or difficulty breathing. °· You have chest pain or leg pain. °· You have ongoing nausea, vomiting, or diarrhea. °This information is not intended to replace advice given to you by your health care provider. Make sure you discuss any questions you have with your health care provider. °Document Released: 08/21/2004 Document Revised: 07/07/2015 Document Reviewed: 01/12/2015 °Elsevier Interactive Patient Education © 2018 [employer] ° °

## 2018-01-04 NOTE — Op Note (Signed)
Preoperative Diagnosis:  Multiparous female desires permanent sterilization  Postoperative Diagnosis:  Same as above  Procedure:  Laparoscopic Right Tubal Ligation using bipolar cautery(pt is s/p LSO in the past)  Surgeon:  Rockne CoonsLuther H Eure  Jr MD  Anaesthesia:  LMA  Findings:  Patient had normal pelvic anatomy and no intraperitoneal abnormalities. Of course her left tube and ovary were surgically absent  Description of Operation:  Patient was taken to the OR and placed into supine position where she underwent LMA anaesthesia.  She was placed in the dorsal lithotomy position and prepped and draped in the usual sterile fashion.  An incision was made in the umbilicus and dissection taken down to the rectus fascia which was incised and opened.  The non bladed trocar was then placed and the peritoneal cavity was insufflated.  The above noted findings were observed.  A 5 mm trocar was placed in the LLQ under direct visualization. The Bipolar cautery unit  was employed and the right tube were burned to no resistance and beyond in the distal ishtmic and ampullary regions, approximately a 3.5 cm segment bilaterally.  There was good hemostasis.  The fascia, peritoneum and subcutaneous tissue were closed using 0 vicryl.  The skin was closed using staples.  The patient was awakened from anaesthesia and taken to the PACU with all counts being correct x 3.  The patient received Ancef 2 gram and Toradol 30 mg IV preoperatively.   Lazaro ArmsLuther H Eure, MD 01/04/2018 2:58 PM

## 2018-01-04 NOTE — Anesthesia Postprocedure Evaluation (Signed)
Anesthesia Post Note  Patient: Jessica Poole  Procedure(s) Performed: LAPAROSCOPIC RIGHT TUBAL LIGATION USING ELECTROCAUTERY (Bilateral )  Patient location during evaluation: PACU Anesthesia Type: General Level of consciousness: awake and alert and patient cooperative Pain management: pain level controlled Vital Signs Assessment: post-procedure vital signs reviewed and stable Respiratory status: spontaneous breathing, nonlabored ventilation and respiratory function stable Cardiovascular status: blood pressure returned to baseline Postop Assessment: adequate PO intake and no apparent nausea or vomiting Anesthetic complications: no     Last Vitals:  Vitals:   01/04/18 1251 01/04/18 1511  BP: 127/88 96/67  Pulse: 76 62  Resp: 18 16  Temp: 37 C 36.6 C  SpO2: 98% 98%    Last Pain:  Vitals:   01/04/18 1511  TempSrc:   PainSc: 0-No pain                 Deng Kemler J

## 2018-01-05 ENCOUNTER — Encounter (HOSPITAL_COMMUNITY): Payer: Self-pay | Admitting: Obstetrics & Gynecology

## 2018-01-11 ENCOUNTER — Encounter: Payer: Self-pay | Admitting: Obstetrics & Gynecology

## 2018-01-11 ENCOUNTER — Ambulatory Visit (INDEPENDENT_AMBULATORY_CARE_PROVIDER_SITE_OTHER): Payer: 59 | Admitting: Obstetrics & Gynecology

## 2018-01-11 ENCOUNTER — Other Ambulatory Visit: Payer: Self-pay

## 2018-01-11 VITALS — BP 100/73 | HR 76 | Ht 60.0 in | Wt 119.0 lb

## 2018-01-11 DIAGNOSIS — Z9889 Other specified postprocedural states: Secondary | ICD-10-CM

## 2018-01-11 NOTE — Progress Notes (Signed)
Patient ID: Jessica Poole, female   DOB: 09/20/1995, 22 y.o.   MRN: [id number]  HPI: Patient returns for routine postoperative follow-up having undergone lap BTL on 01/04/2018.  The patient's immediate postoperative recovery has been unremarkable. Since hospital discharge the patient reports no problems.   No current outpatient medications on file. No current facility-administered medications for this visit.     Blood pressure 100/73, pulse 76, height 5' (1.524 m), weight 119 lb (54 kg), last menstrual period 01/02/2018, not currently breastfeeding.  Physical Exam: Incision clean dry  Diagnostic Tests:   Pathology: n/a  Impression: S/p laparoscopic tubal ligation  Plan:   Follow up: 1  years  Lazaro ArmsLuther H Eure, MD

## 2018-03-27 ENCOUNTER — Other Ambulatory Visit: Payer: Self-pay

## 2018-03-27 ENCOUNTER — Ambulatory Visit (INDEPENDENT_AMBULATORY_CARE_PROVIDER_SITE_OTHER): Payer: 59 | Admitting: Obstetrics & Gynecology

## 2018-03-27 ENCOUNTER — Encounter: Payer: Self-pay | Admitting: Obstetrics & Gynecology

## 2018-03-27 VITALS — BP 115/92 | HR 89 | Ht 60.0 in | Wt 116.0 lb

## 2018-03-27 DIAGNOSIS — Z113 Encounter for screening for infections with a predominantly sexual mode of transmission: Secondary | ICD-10-CM

## 2018-03-27 DIAGNOSIS — N814 Uterovaginal prolapse, unspecified: Secondary | ICD-10-CM | POA: Diagnosis not present

## 2018-03-27 DIAGNOSIS — N941 Unspecified dyspareunia: Secondary | ICD-10-CM

## 2018-03-27 MED ORDER — DOXYCYCLINE HYCLATE 100 MG PO TABS: 100.0000 mg | ORAL_TABLET | Freq: Two times a day (BID) | ORAL | 0 refills | Status: DC

## 2018-03-27 NOTE — Progress Notes (Signed)
Chief Complaint  Patient presents with  . Vaginal Discharge    "feels like something is in vagina; pain with intercourse      23 y.o. [id number] Patient's last menstrual period was 03/03/2018. The current method of family planning is tubal ligation.  Outpatient Encounter Medications as of 03/27/2018  Medication Sig  . doxycycline (VIBRA-TABS) 100 MG tablet Take 1 tablet (100 mg total) by mouth 2 (two) times daily.   No facility-administered encounter medications on file as of 03/27/2018.     Subjective Pt states she has had pain wth intercourse about 2-3 months after birth of last child Feels like something is in her vagina Pain is positional Has a pulling with coughing Minimal incontinence Pain is moderate, has to stop that position  Past Medical History:  Diagnosis Date  . Anxiety   . Arthritis   . Depression     Past Surgical History:  Procedure Laterality Date  . CESAREAN SECTION    . HERNIA REPAIR     23 yrs old  . LAPAROSCOPIC LYSIS OF ADHESIONS  12/21/2016   Procedure: LAPAROSCOPIC LYSIS OF ADHESIONS;  Surgeon: Tilda Burrow, MD;  Location: [facility];  Service: Gynecology;;  . LAPAROSCOPIC SALPINGO OOPHERECTOMY Left 12/21/2016   Procedure: LAPAROSCOPIC LEFT SALPINGO OOPHORECTOMY;  Surgeon: Tilda Burrow, MD;  Location: [facility];  Service: Gynecology;  Laterality: Left;  . LAPAROSCOPIC TUBAL LIGATION Bilateral 01/04/2018   Procedure: LAPAROSCOPIC RIGHT TUBAL LIGATION USING ELECTROCAUTERY;  Surgeon: Lazaro Arms, MD;  Location: [facility];  Service: Gynecology;  Laterality: Bilateral;  . LAPAROSCOPY  12/21/2016   Procedure: LAPAROSCOPY DIAGNOSTIC;  Surgeon: Tilda Burrow, MD;  Location: [facility];  Service: Gynecology;;    OB History    Gravida  2   Para  2   Term  1   Preterm  1   AB  0   Living  2     SAB      TAB      Ectopic      Multiple  0   Live Births  2           Allergies  Allergen Reactions  . Latex Rash    Social  History   Socioeconomic History  . Marital status: Divorced    Spouse name: Not on file  . Number of children: Not on file  . Years of education: Not on file  . Highest education level: Not on file  Occupational History  . Not on file  Social Needs  . Financial resource strain: Not on file  . Food insecurity:    Worry: Not on file    Inability: Not on file  . Transportation needs:    Medical: Not on file    Non-medical: Not on file  Tobacco Use  . Smoking status: Never Smoker  . Smokeless tobacco: Never Used  Substance and Sexual Activity  . Alcohol use: Not Currently    Frequency: Never    Comment: occ; not now  . Drug use: No  . Sexual activity: Yes    Birth control/protection: Surgical  Lifestyle  . Physical activity:    Days per week: Not on file    Minutes per session: Not on file  . Stress: Not on file  Relationships  . Social connections:    Talks on phone: Not on file    Gets together: Not on file    Attends religious service: Not on file  Active member of club or organization: Not on file    Attends meetings of clubs or organizations: Not on file    Relationship status: Not on file  Other Topics Concern  . Not on file  Social History Narrative  . Not on file    Family History  Problem Relation Age of Onset  . Heart disease Paternal Grandfather   . Depression Maternal Grandmother   . Drug abuse Father   . Hypertension Father   . Hypertension Paternal Aunt     Medications:       Current Outpatient Medications:  .  doxycycline (VIBRA-TABS) 100 MG tablet, Take 1 tablet (100 mg total) by mouth 2 (two) times daily., Disp: 20 tablet, Rfl: 0  Objective Blood pressure (!) 115/92, pulse 89, height 5' (1.524 m), weight 116 lb (52.6 kg), last menstrual period 03/03/2018, not currently breastfeeding.  General WDWN female NAD Vulva:  normal appearing vulva with no masses, tenderness or lesions Vagina:  normal mucosa, no discharge Cervix:  Normal no  lesions Uterus:  Grade II uterine prolapse Adnexa: ovaries:present,  normal adnexa in size, nontender and no masses   Pertinent ROS No burning with urination, frequency or urgency No nausea, vomiting or diarrhea Nor fever chills or other constitutional symptoms   Labs or studies     Impression Diagnoses this Encounter::   ICD-10-CM   1. Dyspareunia, female N94.10   2. Screening for STD (sexually transmitted disease) Z11.3 GC/Chlamydia Probe Amp  3. Uterine prolapse N81.4     Established relevant diagnosis(es):   Plan/Recommendations: Meds ordered this encounter  Medications  . doxycycline (VIBRA-TABS) 100 MG tablet    Sig: Take 1 tablet (100 mg total) by mouth 2 (two) times daily.    Dispense:  20 tablet    Refill:  0    Labs or Scans Ordered: Orders Placed This Encounter  Procedures  . GC/Chlamydia Probe Amp    Management:: >empiric course of dosycycline  Follow up Return in about 3 weeks (around 04/17/2018) for Follow up, with Dr Despina Hidden.    All questions were answered.

## 2018-03-31 LAB — GC/CHLAMYDIA PROBE AMP
Chlamydia trachomatis, NAA: NEGATIVE
Neisseria gonorrhoeae by PCR: NEGATIVE

## 2018-04-17 ENCOUNTER — Encounter: Payer: Self-pay | Admitting: Obstetrics & Gynecology

## 2018-04-17 ENCOUNTER — Ambulatory Visit (INDEPENDENT_AMBULATORY_CARE_PROVIDER_SITE_OTHER): Payer: 59 | Admitting: Obstetrics & Gynecology

## 2018-04-17 VITALS — BP 124/89 | HR 97 | Ht 60.0 in | Wt 118.0 lb

## 2018-04-17 DIAGNOSIS — N941 Unspecified dyspareunia: Secondary | ICD-10-CM | POA: Diagnosis not present

## 2018-04-17 DIAGNOSIS — N814 Uterovaginal prolapse, unspecified: Secondary | ICD-10-CM

## 2018-04-17 NOTE — Progress Notes (Signed)
Preoperative History and Physical  Jessica Poole is a 23 y.o. [phone] with Patient's last menstrual period was 03/27/2018. admitted for a vaginal hysterectomy, due to symptomatic pelvic prolapse.  Pt has completed child bearing and given options wants to proceed with TVH   PMH:    Past Medical History:  Diagnosis Date  . Anxiety   . Arthritis   . Depression     PSH:     Past Surgical History:  Procedure Laterality Date  . CESAREAN SECTION    . HERNIA REPAIR     23 yrs old  . LAPAROSCOPIC LYSIS OF ADHESIONS  12/21/2016   Procedure: LAPAROSCOPIC LYSIS OF ADHESIONS;  Surgeon: Tilda Burrow, MD;  Location: [facility];  Service: Gynecology;;  . LAPAROSCOPIC SALPINGO OOPHERECTOMY Left 12/21/2016   Procedure: LAPAROSCOPIC LEFT SALPINGO OOPHORECTOMY;  Surgeon: Tilda Burrow, MD;  Location: [facility];  Service: Gynecology;  Laterality: Left;  . LAPAROSCOPIC TUBAL LIGATION Bilateral 01/04/2018   Procedure: LAPAROSCOPIC RIGHT TUBAL LIGATION USING ELECTROCAUTERY;  Surgeon: Lazaro Arms, MD;  Location: [facility];  Service: Gynecology;  Laterality: Bilateral;  . LAPAROSCOPY  12/21/2016   Procedure: LAPAROSCOPY DIAGNOSTIC;  Surgeon: Tilda Burrow, MD;  Location: [facility];  Service: Gynecology;;    POb/GynH:      OB History    Gravida  2   Para  2   Term  1   Preterm  1   AB  0   Living  2     SAB      TAB      Ectopic      Multiple  0   Live Births  2           SH:   Social History   Tobacco Use  . Smoking status: Never Smoker  . Smokeless tobacco: Never Used  Substance Use Topics  . Alcohol use: Yes    Frequency: Never    Comment: occ  . Drug use: No    FH:    Family History  Problem Relation Age of Onset  . Heart disease Paternal Grandfather   . Depression Maternal Grandmother   . Drug abuse Father   . Hypertension Father   . Hypertension Paternal Aunt      Allergies:  Allergies  Allergen Reactions  . Latex Rash    Medications:      No  current outpatient medications on file.  Review of Systems:   Review of Systems  Constitutional: Negative for fever, chills, weight loss, malaise/fatigue and diaphoresis.  HENT: Negative for hearing loss, ear pain, nosebleeds, congestion, sore throat, neck pain, tinnitus and ear discharge.   Eyes: Negative for blurred vision, double vision, photophobia, pain, discharge and redness.  Respiratory: Negative for cough, hemoptysis, sputum production, shortness of breath, wheezing and stridor.   Cardiovascular: Negative for chest pain, palpitations, orthopnea, claudication, leg swelling and PND.  Gastrointestinal: Positive for abdominal pain. Negative for heartburn, nausea, vomiting, diarrhea, constipation, blood in stool and melena.  Genitourinary: Negative for dysuria, urgency, frequency, hematuria and flank pain.  Musculoskeletal: Negative for myalgias, back pain, joint pain and falls.  Skin: Negative for itching and rash.  Neurological: Negative for dizziness, tingling, tremors, sensory change, speech change, focal weakness, seizures, loss of consciousness, weakness and headaches.  Endo/Heme/Allergies: Negative for environmental allergies and polydipsia. Does not bruise/bleed easily.  Psychiatric/Behavioral: Negative for depression, suicidal ideas, hallucinations, memory loss and substance abuse. The patient is not nervous/anxious and does not have insomnia.  PHYSICAL EXAM:  Blood pressure 124/89, pulse 97, height 5' (1.524 m), weight 118 lb (53.5 kg), last menstrual period 03/27/2018, not currently breastfeeding.    Vitals reviewed. Constitutional: She is oriented to person, place, and time. She appears well-developed and well-nourished.  HENT:  Head: Normocephalic and atraumatic.  Right Ear: External ear normal.  Left Ear: External ear normal.  Nose: Nose normal.  Mouth/Throat: Oropharynx is clear and moist.  Eyes: Conjunctivae and EOM are normal. Pupils are equal, round, and  reactive to light. Right eye exhibits no discharge. Left eye exhibits no discharge. No scleral icterus.  Neck: Normal range of motion. Neck supple. No tracheal deviation present. No thyromegaly present.  Cardiovascular: Normal rate, regular rhythm, normal heart sounds and intact distal pulses.  Exam reveals no gallop and no friction rub.   No murmur heard. Respiratory: Effort normal and breath sounds normal. No respiratory distress. She has no wheezes. She has no rales. She exhibits no tenderness.  GI: Soft. Bowel sounds are normal. She exhibits no distension and no mass. There is tenderness. There is no rebound and no guarding.  Genitourinary:       Vulva is normal without lesions Vagina is pink moist without discharge Cervix normal in appearance and pap is normal Uterus is Grade II uterine prolapse NSSC otherwise non tender Adnexa is negative with normal sized ovaries by sonogram  Musculoskeletal: Normal range of motion. She exhibits no edema and no tenderness.  Neurological: She is alert and oriented to person, place, and time. She has normal reflexes. She displays normal reflexes. No cranial nerve deficit. She exhibits normal muscle tone. Coordination normal.  Skin: Skin is warm and dry. No rash noted. No erythema. No pallor.  Psychiatric: She has a normal mood and affect. Her behavior is normal. Judgment and thought content normal.    Labs: No results found for this or any previous visit (from the past 336 hour(s)).  EKG: No orders found for this or any previous visit.  Imaging Studies: No results found.    Assessment: Grade II uterine prolapse with pelvic pressure pain and dyspareunia  Plan: [facility] 05/03/2018  Pt understands the risks of surgery including but not limited t  excessive bleeding requiring transfusion or reoperation, post-operative infection requiring prolonged hospitalization or re-hospitalization and antibiotic therapy, and damage to other organs including bladder,  bowel, ureters and major vessels.  The patient also understands the alternative treatment options which were discussed in full.  All questions were answered.  [name] 04/17/2018 10:07 AM   Lazaro Arms 04/17/2018 10:07 AM     Face to face time:  15 minutes  Greater than 50% of the visit time was spent in counseling and coordination of care with the patient.  The summary and outline of the counseling and care coordination is summarized in the note above.   All questions were answered.

## 2018-04-25 NOTE — Patient Instructions (Signed)
Jessica Poole  04/25/2018     @   Your procedure is scheduled on  05/03/2018   Report to [facility] at  800  A.M.  Call this number if you have problems the morning of surgery:  [phone]   Remember:  Do not eat or drink after midnight.                          Take these medicines the morning of surgery with A SIP OF WATER  None    Do not wear jewelry, make-up or nail polish.  Do not wear lotions, powders, or perfumes, or deodorant.  Do not shave 48 hours prior to surgery.  Men may shave face and neck.  Do not bring valuables to the hospital.  [facility] is not responsible for any belongings or valuables.  Contacts, dentures or bridgework may not be worn into surgery.  Leave your suitcase in the car.  After surgery it may be brought to your room.  For patients admitted to the hospital, discharge time will be determined by your treatment team.  Patients discharged the day of surgery will not be allowed to drive home.   Name and phone number of your driver:   family Special instructions:  None  Please read over the following fact sheets that you were given. Pain Booklet, Coughing and Deep Breathing, Blood Transfusion Information, MRSA Information, Surgical Site Infection Prevention, Anesthesia Post-op Instructions and Care and Recovery After Surgery       Vaginal Hysterectomy, Care After Refer to this sheet in the next few weeks. These instructions provide you with information about caring for yourself after your procedure. Your health care provider may also give you more specific instructions. Your treatment has been planned according to current medical practices, but problems sometimes occur. Call your health care provider if you have any problems or questions after your procedure. What can I expect after the procedure? After the procedure, it is common to have:  Pain.  Soreness and numbness in your incision areas.  Vaginal  bleeding and discharge.  Constipation.  Temporary problems emptying the bladder.  Feelings of sadness or other emotions. Follow these instructions at home: Medicines  Take over-the-counter and prescription medicines only as told by your health care provider.  If you were prescribed an antibiotic medicine, take it as told by your health care provider. Do not stop taking the antibiotic even if you start to feel better.  Do not drive or operate heavy machinery while taking prescription pain medicine. Activity  Return to your normal activities as told by your health care provider. Ask your health care provider what activities are safe for you.  Get regular exercise as told by your health care provider. You may be told to take short walks every day and go farther each time.  Do not lift anything that is heavier than 10 lb (4.5 kg). General instructions   Do not put anything in your vagina for 6 weeks after your surgery or as told by your health care provider. This includes tampons and douches.  Do not have sex until your health care provider says you can.  Do not take baths, swim, or use a hot tub until your health care provider approves.  Drink enough fluid to keep your urine clear or pale yellow.  Do not drive for Jessica SAMPLEYwere given a sedative.  Keep all follow-up  visits as told by your health care provider. This is important. Contact a health care provider if:  Your pain medicine is not helping.  You have a fever.  You have redness, swelling, or pain at your incision site.  You have blood, pus, or a bad-smelling discharge from your vagina.  You continue to have difficulty urinating. Get help right away if:  You have severe abdominal or back pain.  You have heavy bleeding from your vagina.  You have chest pain or shortness of breath. This information is not intended to replace advice given to you by your health care provider. Make sure you discuss any  questions you have with your health care provider. Document Released: 05/26/2015 Document Revised: 07/10/2015 Document Reviewed: 02/16/2015 Elsevier Interactive Patient Education  2019 [employer] General Anesthesia, Adult, Care After This sheet gives you information about how to care for yourself after your procedure. Your health care provider may also give you more specific instructions. If you have problems or questions, contact your health care provider. What can I expect after the procedure? After the procedure, the following side effects are common:  Pain or discomfort at the IV site.  Nausea.  Vomiting.  Sore throat.  Trouble concentrating.  Feeling cold or chills.  Weak or tired.  Sleepiness and fatigue.  Soreness and body aches. These side effects can affect parts of the body that were not involved in surgery. Follow these instructions at home:  For at least 24 hours after the procedure:  Have a responsible adult stay with you. It is important to have someone help care for you until you are awake and alert.  Rest as needed.  Do not: ? Participate in activities in which you could fall or become injured. ? Drive. ? Use heavy machinery. ? Drink alcohol. ? Take sleeping pills or medicines that cause drowsiness. ? Make important decisions or sign legal documents. ? Take care of children on your own. Eating and drinking  Follow any instructions from your health care provider about eating or drinking restrictions.  When you feel hungry, start by eating small amounts of foods that are soft and easy to digest (bland), such as toast. Gradually return to your regular diet.  Drink enough fluid to keep your urine pale yellow.  If you vomit, rehydrate by drinking water, juice, or clear broth. General instructions  If you have sleep apnea, surgery and certain medicines can increase your risk for breathing problems. Follow instructions from your health care provider  about wearing your sleep device: ? Anytime you are sleeping, including during daytime naps. ? While taking prescription pain medicines, sleeping medicines, or medicines that make you drowsy.  Return to your normal activities as told by your health care provider. Ask your health care provider what activities are safe for you.  Take over-the-counter and prescription medicines only as told by your health care provider.  If you smoke, do not smoke without supervision.  Keep all follow-up visits as told by your health care provider. This is important. Contact a health care provider if:  You have nausea or vomiting that does not get better with medicine.  You cannot eat or drink without vomiting.  You have pain that does not get better with medicine.  You are unable to pass urine.  You develop a skin rash.  You have a fever.  You have redness around your IV site that gets worse. Get help right away if:  You have difficulty breathing.  You have  chest pain.  You have blood in your urine or stool, or you vomit blood. Summary  After the procedure, it is common to have a sore throat or nausea. It is also common to feel tired.  Have a responsible adult stay with you for the first 24 hours after general anesthesia. It is important to have someone help care for you until you are awake and alert.  When you feel hungry, start by eating small amounts of foods that are soft and easy to digest (bland), such as toast. Gradually return to your regular diet.  Drink enough fluid to keep your urine pale yellow.  Return to your normal activities as told by your health care provider. Ask your health care provider what activities are safe for you. This information is not intended to replace advice given to you by your health care provider. Make sure you discuss any questions you have with your health care provider. Document Released: 05/10/2000 Document Revised: 09/17/2016 Document Reviewed:  09/17/2016 Elsevier Interactive Patient Education  2019 [employer].

## 2018-04-27 ENCOUNTER — Other Ambulatory Visit: Payer: Self-pay

## 2018-04-27 ENCOUNTER — Encounter (HOSPITAL_COMMUNITY)
Admission: RE | Admit: 2018-04-27 | Discharge: 2018-04-27 | Disposition: A | Discharge status: Home / Self Care | Payer: 59 | Source: Ambulatory Visit | Attending: Obstetrics & Gynecology | Admitting: Obstetrics & Gynecology

## 2018-04-27 ENCOUNTER — Encounter (HOSPITAL_COMMUNITY): Payer: Self-pay

## 2018-04-27 DIAGNOSIS — Z01812 Encounter for preprocedural laboratory examination: Secondary | ICD-10-CM | POA: Diagnosis present

## 2018-04-27 LAB — CBC
HCT: 35.4 % — ABNORMAL LOW (ref 36.0–46.0)
HEMOGLOBIN: 11.3 g/dL — AB (ref 12.0–15.0)
MCH: 29.7 pg (ref 26.0–34.0)
MCHC: 31.9 g/dL (ref 30.0–36.0)
MCV: 93.2 fL (ref 80.0–100.0)
Platelets: 208 10*3/uL (ref 150–400)
RBC: 3.8 MIL/uL — ABNORMAL LOW (ref 3.87–5.11)
RDW: 12.8 % (ref 11.5–15.5)
WBC: 5.5 10*3/uL (ref 4.0–10.5)
nRBC: 0 % (ref 0.0–0.2)

## 2018-04-27 LAB — TYPE AND SCREEN
ABO/RH(D): B POS
Antibody Screen: NEGATIVE

## 2018-04-27 LAB — COMPREHENSIVE METABOLIC PANEL
ALT: 13 U/L (ref 0–44)
AST: 19 U/L (ref 15–41)
Albumin: 4.5 g/dL (ref 3.5–5.0)
Alkaline Phosphatase: 70 U/L (ref 38–126)
Anion gap: 8 (ref 5–15)
BUN: 12 mg/dL (ref 6–20)
CO2: 24 mmol/L (ref 22–32)
CREATININE: 0.82 mg/dL (ref 0.44–1.00)
Calcium: 9.2 mg/dL (ref 8.9–10.3)
Chloride: 108 mmol/L (ref 98–111)
GFR calc Af Amer: >60 mL/min (ref >60)
GFR calc non Af Amer: >60 mL/min (ref >60)
Glucose, Bld: 86 mg/dL (ref 70–99)
Potassium: 4.4 mmol/L (ref 3.5–5.1)
Sodium: 140 mmol/L (ref 135–145)
Total Bilirubin: 0.6 mg/dL (ref 0.3–1.2)
Total Protein: 7.5 g/dL (ref 6.5–8.1)

## 2018-04-27 LAB — HCG, QUANTITATIVE, PREGNANCY: hCG, Beta Chain, Quant, S: <1 m[IU]/mL (ref <5)

## 2018-05-03 ENCOUNTER — Encounter (HOSPITAL_COMMUNITY)
Admission: RE | Disposition: A | Discharge status: Home / Self Care | Payer: Self-pay | Source: Home / Self Care | Attending: Obstetrics & Gynecology

## 2018-05-03 ENCOUNTER — Ambulatory Visit (HOSPITAL_COMMUNITY): Payer: 59 | Admitting: Anesthesiology

## 2018-05-03 ENCOUNTER — Other Ambulatory Visit: Payer: Self-pay

## 2018-05-03 ENCOUNTER — Ambulatory Visit (HOSPITAL_COMMUNITY)
Admission: RE | Admit: 2018-05-03 | Discharge: 2018-05-03 | Disposition: A | Discharge status: Home / Self Care | Payer: 59 | Attending: Obstetrics & Gynecology | Admitting: Obstetrics & Gynecology

## 2018-05-03 ENCOUNTER — Encounter (HOSPITAL_COMMUNITY): Payer: Self-pay

## 2018-05-03 DIAGNOSIS — Z8249 Family history of ischemic heart disease and other diseases of the circulatory system: Secondary | ICD-10-CM | POA: Insufficient documentation

## 2018-05-03 DIAGNOSIS — R102 Pelvic and perineal pain: Secondary | ICD-10-CM | POA: Diagnosis not present

## 2018-05-03 DIAGNOSIS — N812 Incomplete uterovaginal prolapse: Secondary | ICD-10-CM | POA: Insufficient documentation

## 2018-05-03 DIAGNOSIS — Z91040 Latex allergy status: Secondary | ICD-10-CM | POA: Insufficient documentation

## 2018-05-03 DIAGNOSIS — M1990 Unspecified osteoarthritis, unspecified site: Secondary | ICD-10-CM | POA: Diagnosis not present

## 2018-05-03 DIAGNOSIS — N72 Inflammatory disease of cervix uteri: Secondary | ICD-10-CM | POA: Insufficient documentation

## 2018-05-03 DIAGNOSIS — Z90710 Acquired absence of both cervix and uterus: Secondary | ICD-10-CM | POA: Diagnosis present

## 2018-05-03 DIAGNOSIS — N941 Unspecified dyspareunia: Secondary | ICD-10-CM | POA: Insufficient documentation

## 2018-05-03 HISTORY — PX: VAGINAL HYSTERECTOMY: SHX2639

## 2018-05-03 SURGERY — HYSTERECTOMY, VAGINAL
Anesthesia: General | Site: Vagina

## 2018-05-03 MED ORDER — LACTATED RINGERS IV SOLN
INTRAVENOUS | Status: DC
  Administered 2018-05-03 (×2): via INTRAVENOUS

## 2018-05-03 MED ORDER — HYDROCODONE-ACETAMINOPHEN 5-325 MG PO TABS
1.0000 | ORAL_TABLET | ORAL | Status: DC | PRN
  Administered 2018-05-03: 2 via ORAL
  Filled 2018-05-03: qty 2

## 2018-05-03 MED ORDER — FENTANYL CITRATE (PF) 250 MCG/5ML IJ SOLN
INTRAMUSCULAR | Status: AC
  Filled 2018-05-03: qty 5

## 2018-05-03 MED ORDER — CEFAZOLIN SODIUM-DEXTROSE 2-4 GM/100ML-% IV SOLN
INTRAVENOUS | Status: AC
  Filled 2018-05-03: qty 100

## 2018-05-03 MED ORDER — EPHEDRINE 5 MG/ML INJ
INTRAVENOUS | Status: AC
  Filled 2018-05-03: qty 10

## 2018-05-03 MED ORDER — SODIUM CHLORIDE 0.9 % IR SOLN
Status: DC | PRN
  Administered 2018-05-03: 3000 mL

## 2018-05-03 MED ORDER — KETOROLAC TROMETHAMINE 30 MG/ML IJ SOLN
30.0000 mg | Freq: Once | INTRAMUSCULAR | Status: AC
  Administered 2018-05-03: 30 mg via INTRAVENOUS
  Filled 2018-05-03: qty 1

## 2018-05-03 MED ORDER — ROCURONIUM 10MG/ML (10ML) SYRINGE FOR MEDFUSION PUMP - OPTIME
INTRAVENOUS | Status: DC | PRN
  Administered 2018-05-03: 30 mg via INTRAVENOUS

## 2018-05-03 MED ORDER — BUPIVACAINE-EPINEPHRINE (PF) 0.5% -1:200000 IJ SOLN
INTRAMUSCULAR | Status: AC
  Filled 2018-05-03: qty 30

## 2018-05-03 MED ORDER — DEXAMETHASONE SODIUM PHOSPHATE 10 MG/ML IJ SOLN
INTRAMUSCULAR | Status: AC
  Filled 2018-05-03: qty 1

## 2018-05-03 MED ORDER — HYDROCODONE-ACETAMINOPHEN 7.5-325 MG PO TABS: 1.0000 | ORAL_TABLET | Freq: Once | ORAL | Status: DC | PRN

## 2018-05-03 MED ORDER — ONDANSETRON 8 MG PO TBDP: 8.0000 mg | ORAL_TABLET | Freq: Three times a day (TID) | ORAL | 0 refills | Status: DC | PRN

## 2018-05-03 MED ORDER — ENOXAPARIN SODIUM 40 MG/0.4ML ~~LOC~~ SOLN: 40.0000 mg | SUBCUTANEOUS | Status: DC

## 2018-05-03 MED ORDER — PROPOFOL 10 MG/ML IV BOLUS
INTRAVENOUS | Status: DC | PRN
  Administered 2018-05-03: 100 mg via INTRAVENOUS

## 2018-05-03 MED ORDER — ONDANSETRON HCL 4 MG/2ML IJ SOLN
INTRAMUSCULAR | Status: DC | PRN
  Administered 2018-05-03: 4 mg via INTRAVENOUS

## 2018-05-03 MED ORDER — BUPIVACAINE-EPINEPHRINE (PF) 0.5% -1:200000 IJ SOLN
INTRAMUSCULAR | Status: DC | PRN
  Administered 2018-05-03: 20 mL via PERINEURAL

## 2018-05-03 MED ORDER — FENTANYL CITRATE (PF) 100 MCG/2ML IJ SOLN
INTRAMUSCULAR | Status: DC | PRN
  Administered 2018-05-03: 25 ug via INTRAVENOUS
  Administered 2018-05-03 (×2): 50 ug via INTRAVENOUS
  Administered 2018-05-03 (×2): 25 ug via INTRAVENOUS
  Administered 2018-05-03: 50 ug via INTRAVENOUS
  Administered 2018-05-03: 25 ug via INTRAVENOUS

## 2018-05-03 MED ORDER — 0.9 % SODIUM CHLORIDE (POUR BTL) OPTIME
TOPICAL | Status: DC | PRN
  Administered 2018-05-03: 1000 mL

## 2018-05-03 MED ORDER — MIDAZOLAM HCL 2 MG/2ML IJ SOLN: 0.5000 mg | Freq: Once | INTRAMUSCULAR | Status: DC | PRN

## 2018-05-03 MED ORDER — CEFAZOLIN SODIUM-DEXTROSE 2-4 GM/100ML-% IV SOLN
2.0000 g | INTRAVENOUS | Status: AC
  Administered 2018-05-03: 2 g via INTRAVENOUS

## 2018-05-03 MED ORDER — LIDOCAINE 2% (20 MG/ML) 5 ML SYRINGE
INTRAMUSCULAR | Status: AC
  Filled 2018-05-03: qty 5

## 2018-05-03 MED ORDER — MIDAZOLAM HCL 5 MG/5ML IJ SOLN
INTRAMUSCULAR | Status: DC | PRN
  Administered 2018-05-03: 1 mg via INTRAVENOUS

## 2018-05-03 MED ORDER — CIPROFLOXACIN HCL 500 MG PO TABS: 500.0000 mg | ORAL_TABLET | Freq: Two times a day (BID) | ORAL | 0 refills | Status: DC

## 2018-05-03 MED ORDER — FENTANYL CITRATE (PF) 100 MCG/2ML IJ SOLN: 50.0000 ug | INTRAMUSCULAR | Status: DC | PRN

## 2018-05-03 MED ORDER — PROMETHAZINE HCL 25 MG/ML IJ SOLN: 6.2500 mg | INTRAMUSCULAR | Status: DC | PRN

## 2018-05-03 MED ORDER — KETOROLAC TROMETHAMINE 30 MG/ML IJ SOLN: 30.0000 mg | Freq: Four times a day (QID) | INTRAMUSCULAR | Status: DC

## 2018-05-03 MED ORDER — PROPOFOL 10 MG/ML IV BOLUS
INTRAVENOUS | Status: AC
  Filled 2018-05-03: qty 20

## 2018-05-03 MED ORDER — KETOROLAC TROMETHAMINE 10 MG PO TABS: 10.0000 mg | ORAL_TABLET | Freq: Three times a day (TID) | ORAL | 0 refills | Status: DC | PRN

## 2018-05-03 MED ORDER — STERILE WATER FOR IRRIGATION IR SOLN
Status: DC | PRN
  Administered 2018-05-03: 1000 mL

## 2018-05-03 MED ORDER — ROCURONIUM BROMIDE 10 MG/ML (PF) SYRINGE
PREFILLED_SYRINGE | INTRAVENOUS | Status: AC
  Filled 2018-05-03: qty 10

## 2018-05-03 MED ORDER — ARTIFICIAL TEARS OPHTHALMIC OINT
TOPICAL_OINTMENT | OPHTHALMIC | Status: AC
  Filled 2018-05-03: qty 3.5

## 2018-05-03 MED ORDER — ONDANSETRON HCL 4 MG/2ML IJ SOLN
INTRAMUSCULAR | Status: AC
  Filled 2018-05-03: qty 2

## 2018-05-03 MED ORDER — OXYCODONE-ACETAMINOPHEN 5-325 MG PO TABS: 1.0000 | ORAL_TABLET | ORAL | 0 refills | Status: DC | PRN

## 2018-05-03 MED ORDER — PROMETHAZINE HCL 25 MG/ML IJ SOLN: 25.0000 mg | Freq: Four times a day (QID) | INTRAMUSCULAR | Status: DC | PRN

## 2018-05-03 MED ORDER — EPHEDRINE SULFATE 50 MG/ML IJ SOLN
INTRAMUSCULAR | Status: DC | PRN
  Administered 2018-05-03: 10 mg via INTRAVENOUS

## 2018-05-03 MED ORDER — ETOMIDATE 2 MG/ML IV SOLN
INTRAVENOUS | Status: AC
  Filled 2018-05-03: qty 10

## 2018-05-03 MED ORDER — GLYCOPYRROLATE PF 0.2 MG/ML IJ SOSY
PREFILLED_SYRINGE | INTRAMUSCULAR | Status: AC
  Filled 2018-05-03: qty 1

## 2018-05-03 MED ORDER — HYDROMORPHONE HCL 1 MG/ML IJ SOLN
0.2500 mg | INTRAMUSCULAR | Status: DC | PRN
  Administered 2018-05-03 (×2): 0.5 mg via INTRAVENOUS
  Filled 2018-05-03 (×2): qty 0.5

## 2018-05-03 MED ORDER — MIDAZOLAM HCL 2 MG/2ML IJ SOLN
INTRAMUSCULAR | Status: AC
  Filled 2018-05-03: qty 2

## 2018-05-03 SURGICAL SUPPLY — 38 items
APPLIER CLIP 13 LRG OPEN (CLIP)
BLADE SURG SZ10 CARB STEEL (BLADE) IMPLANT
CLIP APPLIE 13 LRG OPEN (CLIP) IMPLANT
CLOTH BEACON ORANGE TIMEOUT ST (SAFETY) ×3 IMPLANT
COVER LIGHT HANDLE STERIS (MISCELLANEOUS) ×6 IMPLANT
COVER WAND RF STERILE (DRAPES) ×3 IMPLANT
DECANTER SPIKE VIAL GLASS SM (MISCELLANEOUS) ×3 IMPLANT
DRAPE HALF SHEET 40X57 (DRAPES) ×3 IMPLANT
DRAPE STERI URO 9X17 APER PCH (DRAPES) ×3 IMPLANT
ELECT REM PT RETURN 9FT ADLT (ELECTROSURGICAL) ×3
ELECTRODE REM PT RTRN 9FT ADLT (ELECTROSURGICAL) ×1 IMPLANT
GAUZE 4X4 16PLY RFD (DISPOSABLE) ×3 IMPLANT
GLOVE BIOGEL PI IND STRL 7.0 (GLOVE) ×4 IMPLANT
GLOVE BIOGEL PI IND STRL 8 (GLOVE) ×1 IMPLANT
GLOVE BIOGEL PI INDICATOR 7.0 (GLOVE) ×8
GLOVE BIOGEL PI INDICATOR 8 (GLOVE) ×2
GLOVE SURG SS PI 7.0 STRL IVOR (GLOVE) ×6 IMPLANT
GLOVE SURG SS PI 8.0 STRL IVOR (GLOVE) ×3 IMPLANT
GOWN STRL REUS W/TWL LRG LVL3 (GOWN DISPOSABLE) ×6 IMPLANT
GOWN STRL REUS W/TWL XL LVL3 (GOWN DISPOSABLE) ×3 IMPLANT
IV NS IRRIG 3000ML ARTHROMATIC (IV SOLUTION) ×3 IMPLANT
KIT BLADEGUARD II DBL (SET/KITS/TRAYS/PACK) ×3 IMPLANT
KIT TURNOVER CYSTO (KITS) ×3 IMPLANT
MANIFOLD NEPTUNE II (INSTRUMENTS) ×3 IMPLANT
NEEDLE HYPO 22GX1.5 SAFETY (NEEDLE) ×3 IMPLANT
NS IRRIG 1000ML POUR BTL (IV SOLUTION) ×3 IMPLANT
PACK PERI GYN (CUSTOM PROCEDURE TRAY) ×3 IMPLANT
PAD ARMBOARD 7.5X6 YLW CONV (MISCELLANEOUS) ×3 IMPLANT
SET BASIN LINEN APH (SET/KITS/TRAYS/PACK) ×3 IMPLANT
SUT MNCRL+ AB 3-0 CT1 36 (SUTURE) IMPLANT
SUT MON AB 3-0 SH 27 (SUTURE) IMPLANT
SUT MONOCRYL AB 3-0 CT1 36IN (SUTURE)
SUT VIC AB 0 CT1 27 (SUTURE) ×4
SUT VIC AB 0 CT1 27XCR 8 STRN (SUTURE) ×2 IMPLANT
SYR CONTROL 10ML LL (SYRINGE) ×3 IMPLANT
TRAY FOLEY SLVR 16FR LF STAT (SET/KITS/TRAYS/PACK) ×3 IMPLANT
VERSALIGHT (MISCELLANEOUS) ×3 IMPLANT
WATER STERILE IRR 1000ML POUR (IV SOLUTION) ×3 IMPLANT

## 2018-05-03 NOTE — Interval H&P Note (Signed)
History and Physical Interval Note:  05/03/2018 7:27 AM  Jessica Poole  has presented today for surgery, with the diagnosis of Uterine Prolapse Dyspareunia.  The various methods of treatment have been discussed with the patient and family. After consideration of risks, benefits and other options for treatment, the patient has consented to  Procedure(s) with comments: HYSTERECTOMY VAGINAL (N/A) - pt knows to arrive at 7:10 as a surgical intervention.  The patient's history has been reviewed, patient examined, no change in status, stable for surgery.  I have reviewed the patient's chart and labs.  Questions were answered to the patient's satisfaction.     Lazaro Arms

## 2018-05-03 NOTE — Transfer of Care (Signed)
Immediate Anesthesia Transfer of Care Note  Patient: Jessica Poole  Procedure(s) Performed: HYSTERECTOMY VAGINAL (N/A Vagina )  Patient Location: PACU  Anesthesia Type:General  Level of Consciousness: awake  Airway & Oxygen Therapy: Patient Spontanous Breathing  Post-op Assessment: Report given to RN  Post vital signs: Reviewed  Last Vitals:  Vitals Value Taken Time  BP 108/78 05/03/2018  9:55 AM  Temp    Pulse 121 05/03/2018  9:58 AM  Resp 15 05/03/2018  9:58 AM  SpO2 100 % 05/03/2018  9:58 AM  Vitals shown include unvalidated device data.  Last Pain:  Vitals:   05/03/18 0725  TempSrc: Oral  PainSc: 0-No pain      Patients Stated Pain Goal: 8 (05/03/18 0725)  Complications: No apparent anesthesia complications

## 2018-05-03 NOTE — Discharge Instructions (Signed)
Vaginal Hysterectomy, Care After  Refer to this sheet in the next few weeks. These instructions provide you with information about caring for yourself after your procedure. Your health care provider may also give you more specific instructions. Your treatment has been planned according to current medical practices, but problems sometimes occur. Call your health care provider if you have any problems or questions after your procedure.  What can I expect after the procedure?  After the procedure, it is common to have:  · Pain.  · Soreness and numbness in your incision areas.  · Vaginal bleeding and discharge.  · Constipation.  · Temporary problems emptying the bladder.  · Feelings of sadness or other emotions.  Follow these instructions at home:  Medicines  · Take over-the-counter and prescription medicines only as told by your health care provider.  · If you were prescribed an antibiotic medicine, take it as told by your health care provider. Do not stop taking the antibiotic even if you start to feel better.  · Do not drive or operate heavy machinery while taking prescription pain medicine.  Activity  · Return to your normal activities as told by your health care provider. Ask your health care provider what activities are safe for you.  · Get regular exercise as told by your health care provider. You may be told to take short walks every day and go farther each time.  · Do not lift anything that is heavier than 10 lb (4.5 kg).  General instructions    · Do not put anything in your vagina for 6 weeks after your surgery or as told by your health care provider. This includes tampons and douches.  · Do not have sex until your health care provider says you can.  · Do not take baths, swim, or use a hot tub until your health care provider approves.  · Drink enough fluid to keep your urine clear or pale yellow.  · Do not drive for 24 hours if you were given a sedative.  · Keep all follow-up visits as told by your health  care provider. This is important.  Contact a health care provider if:  · Your pain medicine is not helping.  · You have a fever.  · You have redness, swelling, or pain at your incision site.  · You have blood, pus, or a bad-smelling discharge from your vagina.  · You continue to have difficulty urinating.  Get help right away if:  · You have severe abdominal or back pain.  · You have heavy bleeding from your vagina.  · You have chest pain or shortness of breath.  This information is not intended to replace advice given to you by your health care provider. Make sure you discuss any questions you have with your health care provider.  Document Released: 05/26/2015 Document Revised: 07/10/2015 Document Reviewed: 02/16/2015  Elsevier Interactive Patient Education © 2019 Elsevier Inc.

## 2018-05-03 NOTE — Op Note (Signed)
Preoperative diagnosis:  1.  Grade 2 uterine prolapse                                         2.  Dyspareunia                                         3.  Pelvic pressure/pain                                         4.    Postoperative diagnosis:  Same as above   Procedure:  Vaginal hysterectomy  Surgeon:  Lazaro Arms MD  Anesthesia:  General Endotracheal  Findings:  Grade 2 uterine prolapse  Intraoperative findings were the same  Description of operation:  The patient was taken from the preoperative area to the operating room in stable condition. She was placed in the supine position and underwent general anesthesia. Once an adequate level of anesthesia was attained she was placed in the dorsal lithotomy position. Patient was prepped and draped in the usual sterile fashion and a Foley catheter was placed.  A weighted speculum was placed and the cervix was grasped with thyroid tenaculums both anteriorly and posteriorly.  0.5% Marcaine plain was injected in a circumferential fashion about the cervix and the electrocautery unit was used to incise the vagina and push at all cervix.  The posterior cul-de-sac was then entered sharply without difficulty.  The uterosacral ligaments were clamped cut and inspection suture ligated and held.  The cardinal ligaments were then clamped cut transfixion suture ligated and cut. The anterior peritoneum was identified the anterior cul-de-sac was entered sharply without difficulty. The anterior and posterior leaves of the broad ligament were plicated and the uterine vessels were clamped cut and suture ligated. Serial pedicles were taken of the fundus with each pedicle being clamped cut and suture ligated. The utero-ovarian ligaments were crossclamped the uterus was removed and both pedicles were transfixion suture ligated. There was good hemostasis of all the pedicles. The peritoneum was then closed in a pursestring fashion using 3-0 Vicryl. The anterior posterior  vagina was closed in interrupted fashion with good resultant hemostasis. Our closure the lower pelvis and vagina were irrigated vigorously.  The sponge needle and instrument counts were correct x 3.  Total blood loss for the procedure was 50 cc.  The patient received 2 g of Ancef and 30 mg of Toradol IV preoperatively prophylactically.  She was taken to the recovery room in good stable condition awake alert doing well.  Lazaro Arms 05/03/2018, 10:35 AM

## 2018-05-03 NOTE — Anesthesia Procedure Notes (Signed)
Procedure Name: Intubation Date/Time: 05/03/2018 8:05 AM Performed by: Ollen Bowl, CRNA Pre-anesthesia Checklist: Patient identified, Patient being monitored, Timeout performed, Emergency Drugs available and Suction available Patient Re-evaluated:Patient Re-evaluated prior to induction Oxygen Delivery Method: Circle system utilized Preoxygenation: Pre-oxygenation with 100% oxygen Induction Type: IV induction Ventilation: Mask ventilation without difficulty Laryngoscope Size: Mac and 3 Grade View: Grade I Tube type: Oral Tube size: 7.0 mm Number of attempts: 1 Airway Equipment and Method: Stylet Placement Confirmation: ETT inserted through vocal cords under direct vision,  positive ETCO2 and breath sounds checked- equal and bilateral Secured at: 22 cm Tube secured with: Tape Dental Injury: Teeth and Oropharynx as per pre-operative assessment

## 2018-05-03 NOTE — H&P (Signed)
Preoperative History and Physical  Jessica Poole is a 23 y.o. [phone] with Patient's last menstrual period was 03/27/2018. admitted for a vaginal hysterectomy, due to symptomatic pelvic prolapse.  Pt has completed child bearing and given options wants to proceed with TVH   PMH:        Past Medical History:  Diagnosis Date  . Anxiety   . Arthritis   . Depression     PSH:          Past Surgical History:  Procedure Laterality Date  . CESAREAN SECTION    . HERNIA REPAIR     23 yrs old  . LAPAROSCOPIC LYSIS OF ADHESIONS  12/21/2016   Procedure: LAPAROSCOPIC LYSIS OF ADHESIONS;  Surgeon: Tilda Burrow, MD;  Location: [facility];  Service: Gynecology;;  . LAPAROSCOPIC SALPINGO OOPHERECTOMY Left 12/21/2016   Procedure: LAPAROSCOPIC LEFT SALPINGO OOPHORECTOMY;  Surgeon: Tilda Burrow, MD;  Location: [facility];  Service: Gynecology;  Laterality: Left;  . LAPAROSCOPIC TUBAL LIGATION Bilateral 01/04/2018   Procedure: LAPAROSCOPIC RIGHT TUBAL LIGATION USING ELECTROCAUTERY;  Surgeon: Lazaro Arms, MD;  Location: [facility];  Service: Gynecology;  Laterality: Bilateral;  . LAPAROSCOPY  12/21/2016   Procedure: LAPAROSCOPY DIAGNOSTIC;  Surgeon: Tilda Burrow, MD;  Location: [facility];  Service: Gynecology;;    POb/GynH:              OB History    Gravida  2   Para  2   Term  1   Preterm  1   AB  0   Living  2     SAB      TAB      Ectopic      Multiple  0   Live Births  2           SH:   Social History        Tobacco Use  . Smoking status: Never Smoker  . Smokeless tobacco: Never Used  Substance Use Topics  . Alcohol use: Yes    Frequency: Never    Comment: occ  . Drug use: No    FH:    Family History  Problem Relation Age of Onset  . Heart disease Paternal Grandfather   . Depression Maternal Grandmother   . Drug abuse Father   . Hypertension Father   . Hypertension Paternal Aunt      Allergies:       Allergies  Allergen Reactions  . Latex Rash    Medications:      No current outpatient medications on file.  Review of Systems:   Review of Systems  Constitutional: Negative for fever, chills, weight loss, malaise/fatigue and diaphoresis.  HENT: Negative for hearing loss, ear pain, nosebleeds, congestion, sore throat, neck pain, tinnitus and ear discharge.   Eyes: Negative for blurred vision, double vision, photophobia, pain, discharge and redness.  Respiratory: Negative for cough, hemoptysis, sputum production, shortness of breath, wheezing and stridor.   Cardiovascular: Negative for chest pain, palpitations, orthopnea, claudication, leg swelling and PND.  Gastrointestinal: Positive for abdominal pain. Negative for heartburn, nausea, vomiting, diarrhea, constipation, blood in stool and melena.  Genitourinary: Negative for dysuria, urgency, frequency, hematuria and flank pain.  Musculoskeletal: Negative for myalgias, back pain, joint pain and falls.  Skin: Negative for itching and rash.  Neurological: Negative for dizziness, tingling, tremors, sensory change, speech change, focal weakness, seizures, loss of consciousness, weakness and headaches.  Endo/Heme/Allergies: Negative for environmental allergies and polydipsia. Does not bruise/bleed  easily.  Psychiatric/Behavioral: Negative for depression, suicidal ideas, hallucinations, memory loss and substance abuse. The patient is not nervous/anxious and does not have insomnia.      PHYSICAL EXAM:  Blood pressure 124/89, pulse 97, height 5' (1.524 m), weight 118 lb (53.5 kg), last menstrual period 03/27/2018, not currently breastfeeding.    Vitals reviewed. Constitutional: She is oriented to person, place, and time. She appears well-developed and well-nourished.  HENT:  Head: Normocephalic and atraumatic.  Right Ear: External ear normal.  Left Ear: External ear normal.  Nose: Nose normal.  Mouth/Throat: Oropharynx is clear  and moist.  Eyes: Conjunctivae and EOM are normal. Pupils are equal, round, and reactive to light. Right eye exhibits no discharge. Left eye exhibits no discharge. No scleral icterus.  Neck: Normal range of motion. Neck supple. No tracheal deviation present. No thyromegaly present.  Cardiovascular: Normal rate, regular rhythm, normal heart sounds and intact distal pulses.  Exam reveals no gallop and no friction rub.   No murmur heard. Respiratory: Effort normal and breath sounds normal. No respiratory distress. She has no wheezes. She has no rales. She exhibits no tenderness.  GI: Soft. Bowel sounds are normal. She exhibits no distension and no mass. There is tenderness. There is no rebound and no guarding.  Genitourinary:       Vulva is normal without lesions Vagina is pink moist without discharge Cervix normal in appearance and pap is normal Uterus is Grade II uterine prolapse NSSC otherwise non tender Adnexa is negative with normal sized ovaries by sonogram  Musculoskeletal: Normal range of motion. She exhibits no edema and no tenderness.  Neurological: She is alert and oriented to person, place, and time. She has normal reflexes. She displays normal reflexes. No cranial nerve deficit. She exhibits normal muscle tone. Coordination normal.  Skin: Skin is warm and dry. No rash noted. No erythema. No pallor.  Psychiatric: She has a normal mood and affect. Her behavior is normal. Judgment and thought content normal.    Labs: No results found for this or any previous visit (from the past 336 hour(s)).  EKG: No orders found for this or any previous visit.  Imaging Studies: Imaging Results  No results found.      Assessment: Grade II uterine prolapse with pelvic pressure pain and dyspareunia  Plan: [facility] 05/03/2018  Pt understands the risks of surgery including but not limited t  excessive bleeding requiring transfusion or reoperation, post-operative infection requiring  prolonged hospitalization or re-hospitalization and antibiotic therapy, and damage to other organs including bladder, bowel, ureters and major vessels.  The patient also understands the alternative treatment options which were discussed in full.  All questions were answered.  Jessica Poole 04/17/2018 10:07 AM   Lazaro Arms 04/17/2018 10:07 AM

## 2018-05-03 NOTE — Anesthesia Postprocedure Evaluation (Signed)
Anesthesia Post Note  Patient: Jessica Poole  Procedure(s) Performed: HYSTERECTOMY VAGINAL (N/A Vagina )  Patient location during evaluation: PACU Anesthesia Type: General Level of consciousness: awake and alert and oriented Pain management: pain level controlled Vital Signs Assessment: post-procedure vital signs reviewed and stable Respiratory status: spontaneous breathing Cardiovascular status: blood pressure returned to baseline Postop Assessment: no apparent nausea or vomiting Anesthetic complications: no     Last Vitals:  Vitals:   05/03/18 1045 05/03/18 1050  BP: (!) 72/63 101/76  Pulse: 80 75  Resp: (!) 24 (!) 21  Temp:    SpO2: 100% 100%    Last Pain:  Vitals:   05/03/18 1050  TempSrc:   PainSc: 4                  Reghan Thul

## 2018-05-03 NOTE — Anesthesia Preprocedure Evaluation (Signed)
Anesthesia Evaluation  Patient identified by MRN, date of birth, ID band Patient awake    Reviewed: Allergy & Precautions, NPO status , Patient's Chart, lab work & pertinent test results  Airway Mallampati: II  TM Distance: >3 FB Neck ROM: Full    Dental no notable dental hx. (+) Teeth Intact   Pulmonary neg pulmonary ROS,    Pulmonary exam normal breath sounds clear to auscultation       Cardiovascular Exercise Tolerance: Good negative cardio ROS Normal cardiovascular examI Rhythm:Regular Rate:Normal  No HTN meds -   Neuro/Psych Anxiety Depression negative neurological ROS  negative psych ROS   GI/Hepatic negative GI ROS, Neg liver ROS,   Endo/Other  negative endocrine ROS  Renal/GU negative Renal ROS  negative genitourinary   Musculoskeletal  (+) Arthritis , Osteoarthritis,    Abdominal   Peds negative pediatric ROS (+)  Hematology negative hematology ROS (+)   Anesthesia Other Findings   Reproductive/Obstetrics negative OB ROS                             Anesthesia Physical Anesthesia Plan  ASA: II  Anesthesia Plan: General   Post-op Pain Management:    Induction: Intravenous  PONV Risk Score and Plan:   Airway Management Planned: Oral ETT  Additional Equipment:   Intra-op Plan:   Post-operative Plan: Extubation in OR  Informed Consent: I have reviewed the patients History and Physical, chart, labs and discussed the procedure including the risks, benefits and alternatives for the proposed anesthesia with the patient or authorized representative who has indicated his/her understanding and acceptance.     Dental advisory given  Plan Discussed with: CRNA  Anesthesia Plan Comments:         Anesthesia Quick Evaluation

## 2018-05-04 ENCOUNTER — Encounter (HOSPITAL_COMMUNITY): Payer: Self-pay | Admitting: Obstetrics & Gynecology

## 2018-05-08 ENCOUNTER — Encounter (HOSPITAL_COMMUNITY): Payer: Self-pay | Admitting: Obstetrics & Gynecology

## 2018-05-10 ENCOUNTER — Telehealth: Payer: Self-pay | Admitting: *Deleted

## 2018-05-10 NOTE — Telephone Encounter (Signed)
Called patient and left detailed message regarding COVID-19 restrictions and screening information.

## 2018-05-11 ENCOUNTER — Other Ambulatory Visit: Payer: Self-pay

## 2018-05-11 ENCOUNTER — Encounter: Payer: Self-pay | Admitting: Obstetrics & Gynecology

## 2018-05-11 ENCOUNTER — Ambulatory Visit (INDEPENDENT_AMBULATORY_CARE_PROVIDER_SITE_OTHER): Admitting: Obstetrics & Gynecology

## 2018-05-11 VITALS — BP 106/77 | HR 79 | Ht 60.0 in | Wt 116.0 lb

## 2018-05-11 DIAGNOSIS — Z9889 Other specified postprocedural states: Secondary | ICD-10-CM

## 2018-05-11 DIAGNOSIS — Z90710 Acquired absence of both cervix and uterus: Secondary | ICD-10-CM

## 2018-05-11 NOTE — Progress Notes (Signed)
  HPI: Patient returns for routine postoperative follow-up having undergone TVH on 05/03/2018.  The patient's immediate postoperative recovery has been unremarkable. Since hospital discharge the patient reports no problems.   No current outpatient medications on file. No current facility-administered medications for this visit.     Blood pressure 106/77, pulse 79, height 5' (1.524 m), weight 116 lb (52.6 kg), last menstrual period 03/27/2018, not currently breastfeeding.  Physical Exam: Vagina suture inplace healing well  Diagnostic Tests:   Pathology: benign  Impression: S/P TVH  Plan: Pelvic rest  Follow up: 5  weeks  Lazaro Arms, MD

## 2018-05-25 ENCOUNTER — Telehealth: Payer: Self-pay | Admitting: *Deleted

## 2018-05-25 NOTE — Telephone Encounter (Signed)
Pt called and stated that she is experiencing pain and bleeding. The bleeding is not heavy but is heavy enough to be in her panties and she sees it when she wipes. She denies sexual activity or lifting or pulling. Advised patient that Dr. Despina Hidden not in office but I would discuss with Dr. Emelda Fear and let her know if she needs to do anything.

## 2018-05-25 NOTE — Telephone Encounter (Signed)
Per Dr. Emelda Fear patient can use iburprofen for pain and if bleeding becomes heavy she will need to go to [facility]. Advised patient that bleeding is likely just due to healing tissue at surgical site. Patient voiced understanding and has no other questions at this time.

## 2018-06-14 ENCOUNTER — Encounter: Payer: Self-pay | Admitting: *Deleted

## 2018-06-15 ENCOUNTER — Encounter: Payer: Self-pay | Admitting: Obstetrics & Gynecology

## 2018-06-15 ENCOUNTER — Ambulatory Visit (INDEPENDENT_AMBULATORY_CARE_PROVIDER_SITE_OTHER): Admitting: Obstetrics & Gynecology

## 2018-06-15 ENCOUNTER — Other Ambulatory Visit: Payer: Self-pay

## 2018-06-15 VITALS — BP 103/78 | HR 74 | Temp 98.2°F | Ht 60.0 in | Wt 117.4 lb

## 2018-06-15 DIAGNOSIS — Z90710 Acquired absence of both cervix and uterus: Secondary | ICD-10-CM

## 2018-06-15 NOTE — Progress Notes (Signed)
  HPI: Patient returns for routine postoperative follow-up having undergone TVH on 05/03/2018.  The patient's immediate postoperative recovery has been unremarkable. Since hospital discharge the patient reports no problems.   No current outpatient medications on file. No current facility-administered medications for this visit.     Blood pressure 103/78, pulse 74, temperature 98.2 F (36.8 C), height 5' (1.524 m), weight 117 lb 6.4 oz (53.3 kg), last menstrual period 03/27/2018, not currently breastfeeding.  Physical Exam: Cuff with suture material still present healing well Normal exam Bimanual normal  Diagnostic Tests:   Pathology: benign  Impression: S/p TVH, 6 weeks post op  Plan: No sex for 3 weeks  Follow up: 1  years  Lazaro Arms, MD

## 2018-07-30 ENCOUNTER — Emergency Department (HOSPITAL_COMMUNITY)
Admission: EM | Admit: 2018-07-30 | Discharge: 2018-07-30 | Disposition: A | Discharge status: Home / Self Care | Attending: Emergency Medicine | Admitting: Emergency Medicine

## 2018-07-30 ENCOUNTER — Other Ambulatory Visit: Payer: Self-pay

## 2018-07-30 ENCOUNTER — Encounter (HOSPITAL_COMMUNITY): Payer: Self-pay | Admitting: Emergency Medicine

## 2018-07-30 DIAGNOSIS — G4709 Other insomnia: Secondary | ICD-10-CM | POA: Diagnosis not present

## 2018-07-30 DIAGNOSIS — F321 Major depressive disorder, single episode, moderate: Secondary | ICD-10-CM | POA: Diagnosis not present

## 2018-07-30 DIAGNOSIS — F600 Paranoid personality disorder: Secondary | ICD-10-CM

## 2018-07-30 DIAGNOSIS — F413 Other mixed anxiety disorders: Secondary | ICD-10-CM | POA: Diagnosis present

## 2018-07-30 DIAGNOSIS — F419 Anxiety disorder, unspecified: Secondary | ICD-10-CM | POA: Insufficient documentation

## 2018-07-30 DIAGNOSIS — F32A Depression, unspecified: Secondary | ICD-10-CM

## 2018-07-30 DIAGNOSIS — Z046 Encounter for general psychiatric examination, requested by authority: Secondary | ICD-10-CM | POA: Diagnosis present

## 2018-07-30 DIAGNOSIS — Z79899 Other long term (current) drug therapy: Secondary | ICD-10-CM | POA: Diagnosis not present

## 2018-07-30 DIAGNOSIS — F331 Major depressive disorder, recurrent, moderate: Secondary | ICD-10-CM | POA: Diagnosis not present

## 2018-07-30 DIAGNOSIS — G4700 Insomnia, unspecified: Secondary | ICD-10-CM | POA: Diagnosis not present

## 2018-07-30 DIAGNOSIS — R440 Auditory hallucinations: Secondary | ICD-10-CM | POA: Insufficient documentation

## 2018-07-30 DIAGNOSIS — F329 Major depressive disorder, single episode, unspecified: Secondary | ICD-10-CM

## 2018-07-30 LAB — BASIC METABOLIC PANEL
Anion gap: 8 (ref 5–15)
BUN: 15 mg/dL (ref 6–20)
CO2: 27 mmol/L (ref 22–32)
Calcium: 9.4 mg/dL (ref 8.9–10.3)
Chloride: 105 mmol/L (ref 98–111)
Creatinine, Ser: 0.7 mg/dL (ref 0.44–1.00)
GFR calc Af Amer: >60 mL/min (ref >60)
GFR calc non Af Amer: >60 mL/min (ref >60)
Glucose, Bld: 91 mg/dL (ref 70–99)
Potassium: 3.3 mmol/L — ABNORMAL LOW (ref 3.5–5.1)
Sodium: 140 mmol/L (ref 135–145)

## 2018-07-30 LAB — CBC WITH DIFFERENTIAL/PLATELET
Abs Immature Granulocytes: 0.01 10*3/uL (ref 0.00–0.07)
Basophils Absolute: 0 10*3/uL (ref 0.0–0.1)
Basophils Relative: 1 %
Eosinophils Absolute: 0.1 10*3/uL (ref 0.0–0.5)
Eosinophils Relative: 2 %
HCT: 37.9 % (ref 36.0–46.0)
Hemoglobin: 12.3 g/dL (ref 12.0–15.0)
Immature Granulocytes: 0 %
Lymphocytes Relative: 19 %
Lymphs Abs: 1.1 10*3/uL (ref 0.7–4.0)
MCH: 28.8 pg (ref 26.0–34.0)
MCHC: 32.5 g/dL (ref 30.0–36.0)
MCV: 88.8 fL (ref 80.0–100.0)
Monocytes Absolute: 0.4 10*3/uL (ref 0.1–1.0)
Monocytes Relative: 7 %
Neutro Abs: 4.2 10*3/uL (ref 1.7–7.7)
Neutrophils Relative %: 71 %
Platelets: 206 10*3/uL (ref 150–400)
RBC: 4.27 MIL/uL (ref 3.87–5.11)
RDW: 14.6 % (ref 11.5–15.5)
WBC: 5.8 10*3/uL (ref 4.0–10.5)
nRBC: 0 % (ref 0.0–0.2)

## 2018-07-30 LAB — ETHANOL: Alcohol, Ethyl (B): <10 mg/dL (ref <10)

## 2018-07-30 LAB — RAPID URINE DRUG SCREEN, HOSP PERFORMED
Amphetamines: NOT DETECTED
Barbiturates: NOT DETECTED
Benzodiazepines: NOT DETECTED
Cocaine: NOT DETECTED
Opiates: NOT DETECTED
Tetrahydrocannabinol: NOT DETECTED

## 2018-07-30 MED ORDER — TRAZODONE HCL 50 MG PO TABS: 50.0000 mg | ORAL_TABLET | Freq: Every day | ORAL | 0 refills | Status: DC

## 2018-07-30 NOTE — ED Triage Notes (Signed)
Pt states she has been suffering with depression for quite some time now. Has been having thoughts of paranoia like someone is watching her at times. Does have occasional thoughts of harming self, but none recently. Feelings have increased over the past 2 years with depression and paranoia. Was hearing sounds last night which kept her from sleeping. Has not sought help for any symptoms in the past except for calling a few places to make appts who did not take her insurance.
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
## 2018-07-30 NOTE — Consult Note (Signed)
Telepsych Consultation   Reason for Consult:  Depression, anxiety, paranoia, insomnia Referring Physician:  EDP Location of Patient:  Location of Provider: Behavioral Health TTS Department  Patient Identification: Jessica Poole MRN:  [id number] Principal Diagnosis: MDD (major depressive disorder), recurrent episode, moderate (HCC) Diagnosis:  Principal Problem:   MDD (major depressive disorder), recurrent episode, moderate (HCC) Active Problems:   Anxiety   Insomnia   Total Time spent with patient: 30 minutes  Subjective:   Jessica Poole is a 23 y.o. female patient reports that she came to the hospital because she felt that she needed help with her depression and her anxiety.  Patient reports that she does have some paranoia as well.  She states that since she was a child she has had heard whispering and still has some auditory and visual hallucinations but states that these are not debilitating to her.  She says they are not severe and she only hears them once in a while.  As for the visual hallucinations they only are occasional.  She denies any substance use.  Patient denies any previous treatment for her mental health disorders.  Patient reports right now 1 of her biggest concerns is lack of sleep and is hoping to get some assistance with that.  Patient also reports that she has attempted to follow-up with multiple outpatient providers but due to her [employer] insurance they have refused her.  Patient currently denies any suicidal or homicidal ideations and denies any hallucinations currently.  Patient states that she is interested in outpatient treatment and would like resources on place to go.  HPI:   Patient is seen by me via tele-psych and have consulted with Dr. Lucianne MussKumar.  At this time patient does not meet inpatient criteria and is psychiatrically cleared.  I have contacted Burgess AmorJulie Idol, PA-C and notified her of the recommendations and requested patient to be prescribed trazodone  50 mg p.o. nightly as needed to assist with sleep. Patient reports that she lives at home with her fianc and her 2 children with the youngest being 659 months old.  She states that her fianc is very supportive and will be there to ensure the children are taking care of if the medication makes her too sedated.  Pt states she has been suffering with depression for quite some time now. Has been having thoughts of paranoia like someone is watching her at times. Does have occasional thoughts of harming self, but none recently. Feelings have increased over the past 2 years with depression and paranoia. Was hearing sounds last night which kept her from sleeping. Has not sought help for any symptoms in the past except for calling a few places to make appts who did not take her insurance.      Past Psychiatric History: MDD, auditory and visual hallucinations, anxiety, insomnia  Risk to Self: Suicidal Ideation: No-Not Currently/Within Last 6 Months(1 month ago) Suicidal Intent: No Is patient at risk for suicide?: No Suicidal Plan?: No Access to Means: No What has been your use of drugs/alcohol within the last 12 months?: Alcohol Triggers for Past Attempts: None known Intentional Self Injurious Behavior: Cutting(more than 6 months ago but less than a yr) Risk to Others: Homicidal Ideation: No Thoughts of Harm to Others: No Current Homicidal Intent: No Current Homicidal Plan: No Access to Homicidal Means: No History of harm to others?: No Assessment of Violence: None Noted Does patient have access to weapons?: No Criminal Charges Pending?: No Does patient have a court date:  No Prior Inpatient Therapy: Prior Inpatient Therapy: No Prior Outpatient Therapy: Prior Outpatient Therapy: No Does patient have an ACCT team?: No Does patient have Intensive In-House Services?  : No Does patient have [facility] services? : No Does patient have P4CC services?: No  Past Medical History:  Past Medical History:   Diagnosis Date  . Anxiety   . Arthritis   . Depression     Past Surgical History:  Procedure Laterality Date  . CESAREAN SECTION    . HERNIA REPAIR Right    23 yrs old-inguinal  . LAPAROSCOPIC LYSIS OF ADHESIONS  12/21/2016   Procedure: LAPAROSCOPIC LYSIS OF ADHESIONS;  Surgeon: Tilda BurrowFerguson, John V, MD;  Location: [facility];  Service: Gynecology;;  . LAPAROSCOPIC SALPINGO OOPHERECTOMY Left 12/21/2016   Procedure: LAPAROSCOPIC LEFT SALPINGO OOPHORECTOMY;  Surgeon: Tilda BurrowFerguson, John V, MD;  Location: [facility];  Service: Gynecology;  Laterality: Left;  . LAPAROSCOPIC TUBAL LIGATION Bilateral 01/04/2018   Procedure: LAPAROSCOPIC RIGHT TUBAL LIGATION USING ELECTROCAUTERY;  Surgeon: Lazaro ArmsEure, Luther H, MD;  Location: [facility];  Service: Gynecology;  Laterality: Bilateral;  . LAPAROSCOPY  12/21/2016   Procedure: LAPAROSCOPY DIAGNOSTIC;  Surgeon: Tilda BurrowFerguson, John V, MD;  Location: [facility];  Service: Gynecology;;  . TUBAL LIGATION    . VAGINAL HYSTERECTOMY N/A 05/03/2018   Procedure: HYSTERECTOMY VAGINAL;  Surgeon: Lazaro ArmsEure, Luther H, MD;  Location: [facility];  Service: Gynecology;  Laterality: N/A;   Family History:  Family History  Problem Relation Age of Onset  . Heart disease Paternal Grandfather   . Depression Maternal Grandmother   . Drug abuse Father   . Hypertension Father   . Hypertension Paternal Aunt    Family Psychiatric  History: Family history of schizophrenia Social History:  Social History   Substance and Sexual Activity  Alcohol Use Yes  . Frequency: Never   Comment: occ     Social History   Substance and Sexual Activity  Drug Use No    Social History   Socioeconomic History  . Marital status: Divorced    Spouse name: Not on file  . Number of children: 2  . Years of education: Not on file  . Highest education level: Not on file  Occupational History  . Not on file  Social Needs  . Financial resource strain: Not on file  . Food insecurity    Worry: Not on file    Inability: Not  on file  . Transportation needs    Medical: Not on file    Non-medical: Not on file  Tobacco Use  . Smoking status: Never Smoker  . Smokeless tobacco: Never Used  Substance and Sexual Activity  . Alcohol use: Yes    Frequency: Never    Comment: occ  . Drug use: No  . Sexual activity: Not Currently    Birth control/protection: Surgical    Comment: tubal  Lifestyle  . Physical activity    Days per week: Not on file    Minutes per session: Not on file  . Stress: Not on file  Relationships  . Social Musicianconnections    Talks on phone: Not on file    Gets together: Not on file    Attends religious service: Not on file    Active member of club or organization: Not on file    Attends meetings of clubs or organizations: Not on file    Relationship status: Not on file  Other Topics Concern  . Not on file  Social History Narrative  . Not  on file   Additional Social History:    Allergies:   Allergies  Allergen Reactions  . Latex Rash    Labs:  Results for orders placed or performed during the hospital encounter of 07/30/18 (from the past 48 hour(s))  CBC with Differential     Status: None   Collection Time: 07/30/18 10:46 AM  Result Value Ref Range   WBC 5.8 4.0 - 10.5 K/uL   RBC 4.27 3.87 - 5.11 MIL/uL   Hemoglobin 12.3 12.0 - 15.0 g/dL   HCT 16.137.9 09.636.0 - 04.546.0 %   MCV 88.8 80.0 - 100.0 fL   MCH 28.8 26.0 - 34.0 pg   MCHC 32.5 30.0 - 36.0 g/dL   RDW 40.914.6 81.111.5 - 91.415.5 %   Platelets 206 150 - 400 K/uL   nRBC 0.0 0.0 - 0.2 %   Neutrophils Relative % 71 %   Neutro Abs 4.2 1.7 - 7.7 K/uL   Lymphocytes Relative 19 %   Lymphs Abs 1.1 0.7 - 4.0 K/uL   Monocytes Relative 7 %   Monocytes Absolute 0.4 0.1 - 1.0 K/uL   Eosinophils Relative 2 %   Eosinophils Absolute 0.1 0.0 - 0.5 K/uL   Basophils Relative 1 %   Basophils Absolute 0.0 0.0 - 0.1 K/uL   Immature Granulocytes 0 %   Abs Immature Granulocytes 0.01 0.00 - 0.07 K/uL    Comment: Performed at [facility], [address] [place], [place] [address]  Basic metabolic panel     Status: Abnormal   Collection Time: 07/30/18 10:46 AM  Result Value Ref Range   Sodium 140 135 - 145 mmol/L   Potassium 3.3 (L) 3.5 - 5.1 mmol/L   Chloride 105 98 - 111 mmol/L   CO2 27 22 - 32 mmol/L   Glucose, Bld 91 70 - 99 mg/dL   BUN 15 6 - 20 mg/dL   Creatinine, Ser 6.210.70 0.44 - 1.00 mg/dL   Calcium 9.4 8.9 - 30.810.3 mg/dL   GFR calc non Af Amer >60 >60 mL/min   GFR calc Af Amer >60 >60 mL/min   Anion gap 8 5 - 15    Comment: Performed at [facility], [address] [place], [place] [address]  Ethanol     Status: None   Collection Time: 07/30/18 10:46 AM  Result Value Ref Range   Alcohol, Ethyl (B) <10 <10 mg/dL    Comment: (NOTE) Lowest detectable limit for serum alcohol is 10 mg/dL. For medical purposes only. Performed at [facility], [address] [place], [place] [address]   Rapid urine drug screen (hospital performed)     Status: None   Collection Time: 07/30/18 10:53 AM  Result Value Ref Range   Opiates NONE DETECTED NONE DETECTED   Cocaine NONE DETECTED NONE DETECTED   Benzodiazepines NONE DETECTED NONE DETECTED   Amphetamines NONE DETECTED NONE DETECTED   Tetrahydrocannabinol NONE DETECTED NONE DETECTED   Barbiturates NONE DETECTED NONE DETECTED    Comment: (NOTE) DRUG SCREEN FOR MEDICAL PURPOSES ONLY.  IF CONFIRMATION IS NEEDED FOR ANY PURPOSE, NOTIFY LAB WITHIN 5 DAYS. LOWEST DETECTABLE LIMITS FOR URINE DRUG SCREEN Drug Class                     Cutoff (ng/mL) Amphetamine and metabolites    1000 Barbiturate and metabolites    200 Benzodiazepine                 200 Tricyclics and metabolites  300 Opiates and metabolites        300 Cocaine and metabolites        300 THC                            50 Performed at [facility], [address] [place], [place] [address]     Medications:  No current facility-administered medications for this encounter.    No current outpatient medications on  file.    Musculoskeletal: Strength & Muscle Tone: within normal limits Gait & Station: normal Patient leans: N/A  Psychiatric Specialty Exam: Physical Exam  Nursing note and vitals reviewed. Constitutional: She is oriented to person, place, and time. She appears well-developed and well-nourished.  Cardiovascular: Normal rate.  Respiratory: Effort normal.  Musculoskeletal: Normal range of motion.  Neurological: She is alert and oriented to person, place, and time.  Skin: Skin is warm.    Review of Systems  Constitutional: Negative.   HENT: Negative.   Eyes: Negative.   Respiratory: Negative.   Cardiovascular: Negative.   Gastrointestinal: Negative.   Genitourinary: Negative.   Musculoskeletal: Negative.   Skin: Negative.   Neurological: Negative.   Endo/Heme/Allergies: Negative.   Psychiatric/Behavioral: Positive for depression. The patient has insomnia.     Blood pressure (!) 124/95, pulse 78, temperature 98.1 F (36.7 C), temperature source Oral, resp. rate 16, height 5' (1.524 m), weight 52.6 kg, last menstrual period 03/27/2018, SpO2 99 %, not currently breastfeeding.Body mass index is 22.65 kg/m.  General Appearance: Casual  Eye Contact:  Good  Speech:  Clear and Coherent and Normal Rate  Volume:  Normal  Mood:  Depressed  Affect:  Congruent  Thought Process:  Coherent and Descriptions of Associations: Intact  Orientation:  Full (Time, Place, and Person)  Thought Content:  WDL  Suicidal Thoughts:  No  Homicidal Thoughts:  No  Memory:  Immediate;   Good Recent;   Good Remote;   Good  Judgement:  Good  Insight:  Good  Psychomotor Activity:  Normal  Concentration:  Concentration: Good and Attention Span: Good  Recall:  Good  Fund of Knowledge:  Good  Language:  Good  Akathisia:  No  Handed:  Right  AIMS (if indicated):     Assets:  Communication Skills Desire for Improvement Financial Resources/Insurance Housing Physical Health Social  Support Transportation  ADL's:  Intact  Cognition:  WNL  Sleep:        Treatment Plan Summary: Plan is to Follow-up with outpatient providers Trial live trazodone 50 mg p.o. nightly as needed to assist with insomnia  Disposition: No evidence of imminent risk to self or others at present.   Patient does not meet criteria for psychiatric inpatient admission. Supportive therapy provided about ongoing stressors. Discussed crisis plan, support from social network, calling 911, coming to the Emergency Department, and calling Suicide Hotline.  This service was provided via telemedicine using a 2-way, interactive audio and video technology.  Names of all persons participating in this telemedicine service and their role in this encounter. Name: Jesse Sans Role: Patient  Name: Marvia Pickles NP Role: Provider  Name:  Role:   Name:  Role:     Lewis Shock, FNP 07/30/2018 1:10 PM

## 2018-07-30 NOTE — ED Notes (Signed)
Pt ready for DC   family outside   [place] discharge

## 2018-07-30 NOTE — ED Provider Notes (Signed)
[facility] EMERGENCY DEPARTMENT Provider Note   CSN: [id number] Arrival date & time: 07/30/18  [id number]    History   Chief Complaint Chief Complaint  Patient presents with  . V70.1  . Depression  . Anxiety    HPI Jessica ClontsSamantha B Poole is a 23 y.o. female with a long standing history of anxiety and depression along with escalating insomnia presenting for evaluation of these symptoms which also includes auditory hallucinations.  She endorses recalling hearing nondescript "whispering" as a child intermittently which has been escalating in frequency.  Last night she did not sleep secondary this sx.  She also endorses possible visual hallucinations, at times sees moving shadows in her periphery but is unsure if this is a visual phenomenon or if she is actually hallucinating.  She denies suicidal or homicidal ideation or plan. She strong family hx of depression, also bipolar and schizophrenia in her mothers family.  She has tried to obtain outpatient care for her symptoms but has been unable to find a provider with her insurance.        The history is provided by the patient.    Past Medical History:  Diagnosis Date  . Anxiety   . Arthritis   . Depression     Patient Active Problem List   Diagnosis Date Noted  . MDD (major depressive disorder), recurrent episode, moderate (HCC) 07/30/2018  . Anxiety 07/30/2018  . Insomnia 07/30/2018  . S/P vaginal hysterectomy 05/03/2018  . Preterm premature rupture of membranes (PPROM) with unknown onset of labor 11/03/2017  . Gestational hypertension 11/02/2017  . Polyhydramnios 10/13/2017  . 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 Previous cesarean section complicating pregnancy 05/23/2017  . Chronic pelvic pain in female 10/27/2016    Past Surgical History:  Procedure Laterality Date  . CESAREAN SECTION    . HERNIA REPAIR Right    23 yrs old-inguinal  . LAPAROSCOPIC LYSIS OF ADHESIONS  12/21/2016   Procedure: LAPAROSCOPIC LYSIS OF ADHESIONS;  Surgeon: Tilda BurrowFerguson, John V, MD;  Location: [facility];  Service: Gynecology;;  . LAPAROSCOPIC SALPINGO OOPHERECTOMY Left 12/21/2016   Procedure: LAPAROSCOPIC LEFT SALPINGO OOPHORECTOMY;  Surgeon: Tilda BurrowFerguson, John V, MD;  Location: [facility];  Service: Gynecology;  Laterality: Left;  . LAPAROSCOPIC TUBAL LIGATION Bilateral 01/04/2018   Procedure: LAPAROSCOPIC RIGHT TUBAL LIGATION USING ELECTROCAUTERY;  Surgeon: Lazaro ArmsEure, Luther H, MD;  Location: [facility];  Service: Gynecology;  Laterality: Bilateral;  . LAPAROSCOPY  12/21/2016   Procedure: LAPAROSCOPY DIAGNOSTIC;  Surgeon: Tilda BurrowFerguson, John V, MD;  Location: [facility];  Service: Gynecology;;  . TUBAL LIGATION    . VAGINAL HYSTERECTOMY N/A 05/03/2018   Procedure: HYSTERECTOMY VAGINAL;  Surgeon: Lazaro ArmsEure, Luther H, MD;  Location: [facility];  Service: Gynecology;  Laterality: N/A;     OB History    Gravida  2   Para  2   Term  1   Preterm  1   AB  0   Living  2     SAB      TAB      Ectopic      Multiple  0   Live Births  2            Home Medications    Prior to Admission medications   Medication Sig Start Date End Date Taking? Authorizing Provider  traZODone (DESYREL) 50 MG tablet Take 1 tablet (50 mg total) by mouth at bedtime. 07/30/18   Burgess AmorIdol, Chalsea Darko, PA-C    Family History  Family History  Problem Relation Age of Onset  . Heart disease Paternal Grandfather   . Depression Maternal Grandmother   . Drug abuse Father   . Hypertension Father   . Hypertension Paternal Aunt     Social History Social History   Tobacco Use  . Smoking status: Never Smoker  . Smokeless tobacco: Never Used  Substance Use Topics  . Alcohol use: Yes    Frequency: Never    Comment: occ  . Drug use: No     Allergies   Latex   Review of Systems Review of Systems  Constitutional: Negative for fever.  HENT: Negative for congestion and sore throat.   Eyes: Negative.   Respiratory: Negative for chest tightness  and shortness of breath.   Cardiovascular: Negative for chest pain.  Gastrointestinal: Negative for abdominal pain and nausea.  Genitourinary: Negative.   Musculoskeletal: Negative for arthralgias, joint swelling and neck pain.  Skin: Negative.  Negative for rash and wound.  Neurological: Negative for dizziness, weakness, light-headedness, numbness and headaches.  Psychiatric/Behavioral: Positive for dysphoric mood and sleep disturbance. Negative for suicidal ideas.     Physical Exam Updated Vital Signs BP 111/83 (BP Location: Left Arm)   Pulse 63   Temp 98 F (36.7 C) (Oral)   Resp 16   Ht 5' (1.524 m)   Wt 52.6 kg   LMP 03/27/2018 (Exact Date)   SpO2 99%   BMI 22.65 kg/m   Physical Exam Vitals signs and nursing note reviewed.  Constitutional:      Appearance: She is well-developed.  HENT:     Head: Normocephalic and atraumatic.  Eyes:     Conjunctiva/sclera: Conjunctivae normal.  Neck:     Musculoskeletal: Normal range of motion.  Cardiovascular:     Rate and Rhythm: Normal rate and regular rhythm.     Heart sounds: Normal heart sounds.  Pulmonary:     Effort: Pulmonary effort is normal.     Breath sounds: Normal breath sounds. No wheezing.  Abdominal:     General: Bowel sounds are normal.     Palpations: Abdomen is soft.     Tenderness: There is no abdominal tenderness.  Musculoskeletal: Normal range of motion.  Skin:    General: Skin is warm and dry.  Neurological:     Mental Status: She is alert.      ED Treatments / Results  Labs (all labs ordered are listed, but only abnormal results are displayed) Labs Reviewed  BASIC METABOLIC PANEL - Abnormal; Notable for the following components:      Result Value   Potassium 3.3 (*)    All other components within normal limits  CBC WITH DIFFERENTIAL/PLATELET  RAPID URINE DRUG SCREEN, HOSP PERFORMED  ETHANOL    EKG None  Radiology No results found.  Procedures Procedures (including critical care  time)  Medications Ordered in ED Medications - No data to display   Initial Impression / Assessment and Plan / ED Course  I have reviewed the triage vital signs and the nursing notes.  Pertinent labs & imaging results that were available during my care of the patient were reviewed by me and considered in my medical decision making (see chart for details).        Pt with depression but no SI/HI with insomnia, h/o long standing mild auditory hallucinations, but without disruption in daily activities.  She has had difficulty obtaining mental health care secondary insurance issues.  She was offered TTS eval which was completed  and [facility] agrees with no need of inpatient tx.  She was given follow up referral with [facility] who does accept her insurance.  Short course of trazadone was also prescribed for prn assistance with insomnia.  Return precautions discussed.    Final Clinical Impressions(s) / ED Diagnoses   Final diagnoses:  Depression, unspecified depression type  Insomnia, unspecified type  Auditory hallucination    ED Discharge Orders         Ordered    traZODone (DESYREL) 50 MG tablet  Daily at bedtime     07/30/18 1337           Evalee Jefferson, PA-C 07/30/18 1418    Noemi Chapel, MD 07/31/18 [phone]

## 2018-07-30 NOTE — ED Notes (Signed)
TTS extender eval

## 2018-07-30 NOTE — Discharge Instructions (Addendum)
You may try the trazodone at night to help with sleep.  Do make sure your fiance is there when you are taking this medicine, as you may sleep very soundly and it will be important for him to be there with your young children.  See the referrals given for outpatient follow up for further treatment of your depression.  In the interim, please do not hesitate to return here if you develop any worsened symptoms.

## 2018-07-30 NOTE — ED Notes (Signed)
TTS completed   Awaiting Physician extender eval from [facility]

## 2018-07-30 NOTE — ED Notes (Signed)
Pt reports "noise all around" since teen  Found father dead several years ago   Noise getting worse   Desires out pt referral and meds ot stop voices

## 2018-07-30 NOTE — ED Notes (Signed)
Call to TTS  Machine in room

## 2018-08-02 ENCOUNTER — Telehealth (HOSPITAL_COMMUNITY): Payer: Self-pay | Admitting: *Deleted

## 2018-08-02 NOTE — Telephone Encounter (Signed)
SELF REFERRAL MAILED NEW PATIENT PPW

## 2018-08-07 ENCOUNTER — Telehealth: Payer: Self-pay | Admitting: Obstetrics & Gynecology

## 2018-08-07 NOTE — Telephone Encounter (Signed)
Patient called, stated that she has a heaviness and pain after intercourse.  She stated this can last all day.  She is also having some spotting through the next day.  [phone]

## 2018-08-08 NOTE — Telephone Encounter (Signed)
Patient states she is still having vaginal heaviness and pressure to the point that she can barely stand lasting all day.  Is also having bleeding after intercourse still. Wants to see Dr Elonda Husky.  Informed he is out of the office until next Monday but could get her in on Tuesday. Ok to wait.  Advised to call back if symptoms worsen.

## 2018-08-14 ENCOUNTER — Encounter: Payer: Self-pay | Admitting: *Deleted

## 2018-08-15 ENCOUNTER — Other Ambulatory Visit: Payer: Self-pay

## 2018-08-15 ENCOUNTER — Encounter: Payer: Self-pay | Admitting: Obstetrics & Gynecology

## 2018-08-15 ENCOUNTER — Ambulatory Visit (INDEPENDENT_AMBULATORY_CARE_PROVIDER_SITE_OTHER): Payer: 59 | Admitting: Obstetrics & Gynecology

## 2018-08-15 VITALS — BP 113/78 | HR 82 | Ht 60.0 in | Wt 118.8 lb

## 2018-08-15 DIAGNOSIS — R102 Pelvic and perineal pain: Secondary | ICD-10-CM

## 2018-08-15 DIAGNOSIS — N930 Postcoital and contact bleeding: Secondary | ICD-10-CM

## 2018-08-15 MED ORDER — ESTROGENS, CONJUGATED 0.625 MG/GM VA CREA: 1.0000 | TOPICAL_CREAM | Freq: Every day | VAGINAL | 11 refills | Status: DC

## 2018-08-15 NOTE — Progress Notes (Signed)
Chief Complaint  Patient presents with  . pain intercourse    spotting / vaginal heaviness      23 y.o. [id number] Patient's last menstrual period was 03/27/2018 (exact date). The current method of family planning is status post hysterectomy.  Outpatient Encounter Medications as of 08/15/2018  Medication Sig  . traZODone (DESYREL) 50 MG tablet Take 1 tablet (50 mg total) by mouth at bedtime. (Patient not taking: Reported on 08/15/2018)   No facility-administered encounter medications on file as of 08/15/2018.     Subjective Pt has been having spotting with and after intercourse since beginning intercourse after her surgery She is also experiencing a new pain, before surgery she was having bump dyspareunia due to uterine prolapse and her cervix was being struck which was unavoidable due to anatomical realities However post op she has little pain during intercourse, alleviate with postiin change, now she has pain which last up to a day post coitally, can be severe doubles her over Past Medical History:  Diagnosis Date  . Anxiety   . Arthritis   . Depression     Past Surgical History:  Procedure Laterality Date  . CESAREAN SECTION    . HERNIA REPAIR Right    23 yrs old-inguinal  . LAPAROSCOPIC LYSIS OF ADHESIONS  12/21/2016   Procedure: LAPAROSCOPIC LYSIS OF ADHESIONS;  Surgeon: Jonnie Kind, MD;  Location: [facility];  Service: Gynecology;;  . LAPAROSCOPIC SALPINGO OOPHERECTOMY Left 12/21/2016   Procedure: LAPAROSCOPIC LEFT SALPINGO OOPHORECTOMY;  Surgeon: Jonnie Kind, MD;  Location: [facility];  Service: Gynecology;  Laterality: Left;  . LAPAROSCOPIC TUBAL LIGATION Bilateral 01/04/2018   Procedure: LAPAROSCOPIC RIGHT TUBAL LIGATION USING ELECTROCAUTERY;  Surgeon: Florian Buff, MD;  Location: [facility];  Service: Gynecology;  Laterality: Bilateral;  . LAPAROSCOPY  12/21/2016   Procedure: LAPAROSCOPY DIAGNOSTIC;  Surgeon: Jonnie Kind, MD;  Location: [facility];  Service:  Gynecology;;  . TUBAL LIGATION    . VAGINAL HYSTERECTOMY N/A 05/03/2018   Procedure: HYSTERECTOMY VAGINAL;  Surgeon: Florian Buff, MD;  Location: [facility];  Service: Gynecology;  Laterality: N/A;    OB History    Gravida  2   Para  2   Term  1   Preterm  1   AB  0   Living  2     SAB      TAB      Ectopic      Multiple  0   Live Births  2           Allergies  Allergen Reactions  . Latex Rash    Social History   Socioeconomic History  . Marital status: Divorced    Spouse name: Not on file  . Number of children: 2  . Years of education: Not on file  . Highest education level: Not on file  Occupational History  . Not on file  Social Needs  . Financial resource strain: Not on file  . Food insecurity    Worry: Not on file    Inability: Not on file  . Transportation needs    Medical: Not on file    Non-medical: Not on file  Tobacco Use  . Smoking status: Never Smoker  . Smokeless tobacco: Never Used  Substance and Sexual Activity  . Alcohol use: Yes    Frequency: Never    Comment: occ  . Drug use: No  . Sexual activity: Yes    Birth control/protection: Surgical  Comment: tubal  Lifestyle  . Physical activity    Days per week: Not on file    Minutes per session: Not on file  . Stress: Not on file  Relationships  . Social Musicianconnections    Talks on phone: Not on file    Gets together: Not on file    Attends religious service: Not on file    Active member of club or organization: Not on file    Attends meetings of clubs or organizations: Not on file    Relationship status: Not on file  Other Topics Concern  . Not on file  Social History Narrative  . Not on file    Family History  Problem Relation Age of Onset  . Heart disease Paternal Grandfather   . Depression Maternal Grandmother   . Drug abuse Father   . Hypertension Father   . Hypertension Paternal Aunt     Medications:       Current Outpatient Medications:  .  traZODone  (DESYREL) 50 MG tablet, Take 1 tablet (50 mg total) by mouth at bedtime. (Patient not taking: Reported on 08/15/2018), Disp: 5 tablet, Rfl: 0  Objective Blood pressure 113/78, pulse 82, height 5' (1.524 m), weight 118 lb 12.8 oz (53.9 kg), last menstrual period 03/27/2018, not currently breastfeeding.  General WDWN female NAD Vulva:  normal appearing vulva with no masses, tenderness or lesions Vagina:  Granulation tissue in left corner of cervix removed without difficulty, vaginal cuff appears normal vosually However to very light palpation with 1 finger she was quite tender along the entire length of the cuff, no nodularity wa felt felt normal except the pain Cervix:  absent Uterus:  uterus absent Adnexa: ovaries:right ovary is present,     Pertinent ROS No burning with urination, frequency or urgency No nausea, vomiting or diarrhea Nor fever chills or other constitutional symptoms   Labs or studies     Impression Diagnoses this Encounter::   ICD-10-CM   1. PCB (post coital bleeding)  N93.0   2. Vaginal pain, for a day or so post intercourse  R10.2    began having pain that lasts up to a day post intercourse, 3-4 weeks ago, minimal discomfort during intercours    Established relevant diagnosis(es):   Plan/Recommendations: No orders of the defined types were placed in this encounter.   Labs or Scans Ordered: No orders of the defined types were placed in this encounter.   Management:: >granulation tissue from the top of the vagina was removed >pain of cuff  Post TVH, will try some vaginal estrogen  Follow up No follow-ups on file.       All questions were answered.

## 2018-08-16 ENCOUNTER — Telehealth: Payer: Self-pay | Admitting: Obstetrics & Gynecology

## 2018-08-16 NOTE — Telephone Encounter (Signed)
1 gram at bedtime

## 2018-08-16 NOTE — Telephone Encounter (Signed)
Patient states that she was given a medication by Dr. Elonda Husky but did not tell how to take it or how much to take. Please contact pt

## 2018-09-01 ENCOUNTER — Encounter: Payer: Self-pay | Admitting: Obstetrics & Gynecology

## 2018-09-01 ENCOUNTER — Other Ambulatory Visit: Payer: Self-pay

## 2018-09-01 ENCOUNTER — Ambulatory Visit (INDEPENDENT_AMBULATORY_CARE_PROVIDER_SITE_OTHER): Admitting: Obstetrics & Gynecology

## 2018-09-01 VITALS — BP 105/73 | HR 73 | Wt 118.0 lb

## 2018-09-01 DIAGNOSIS — N949 Unspecified condition associated with female genital organs and menstrual cycle: Secondary | ICD-10-CM

## 2018-09-01 DIAGNOSIS — R102 Pelvic and perineal pain: Secondary | ICD-10-CM

## 2018-09-01 DIAGNOSIS — N9489 Other specified conditions associated with female genital organs and menstrual cycle: Secondary | ICD-10-CM

## 2018-09-01 NOTE — Progress Notes (Signed)
Chief Complaint  Patient presents with  . Follow-up      23 y.o. [id number] Patient's last menstrual period was 03/27/2018 (exact date). The current method of family planning is status post hysterectomy.  Outpatient Encounter Medications as of 09/01/2018  Medication Sig  . conjugated estrogens (PREMARIN) vaginal cream Place 1 Applicatorful vaginally daily.  . traZODone (DESYREL) 50 MG tablet Take 1 tablet (50 mg total) by mouth at bedtime. (Patient not taking: Reported on 08/15/2018)   No facility-administered encounter medications on file as of 09/01/2018.     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 Anxiety   . Arthritis   . Depression     Past Surgical History:  Procedure Laterality Date  . CESAREAN SECTION    . HERNIA REPAIR Right    23 yrs old-inguinal  . LAPAROSCOPIC LYSIS OF ADHESIONS  12/21/2016   Procedure: LAPAROSCOPIC LYSIS OF ADHESIONS;  Surgeon: Jonnie Kind, MD;  Location: [facility];  Service: Gynecology;;  . LAPAROSCOPIC SALPINGO OOPHERECTOMY Left 12/21/2016   Procedure: LAPAROSCOPIC LEFT SALPINGO OOPHORECTOMY;  Surgeon: Jonnie Kind, MD;  Location: [facility];  Service: Gynecology;  Laterality: Left;  . LAPAROSCOPIC TUBAL LIGATION Bilateral 01/04/2018   Procedure: LAPAROSCOPIC RIGHT TUBAL LIGATION USING ELECTROCAUTERY;  Surgeon: Florian Buff, MD;  Location: [facility];  Service: Gynecology;  Laterality: Bilateral;  .  LAPAROSCOPY  12/21/2016   Procedure: LAPAROSCOPY DIAGNOSTIC;  Surgeon: Jonnie Kind, MD;  Location: [facility];  Service: Gynecology;;  . TUBAL LIGATION    . VAGINAL HYSTERECTOMY N/A 05/03/2018   Procedure: HYSTERECTOMY VAGINAL;  Surgeon: Florian Buff, MD;  Location: [facility];  Service: Gynecology;  Laterality: N/A;    OB History    Gravida  2   Para  2   Term  1   Preterm  1   AB  0   Living  2     SAB      TAB      Ectopic      Multiple  0   Live Births  2           Allergies  Allergen Reactions  . Latex Rash    Social History   Socioeconomic History  . Marital status: Divorced    Spouse name: Not on file  . Number of children: 2  . Years of education: Not on file  . Highest education level: Not on file  Occupational History  . Not on file  Social Needs  . Financial resource strain: Not on file  . Food insecurity    Worry: Not on file    Inability: Not on file  . Transportation needs    Medical: Not on file    Non-medical: Not on file  Tobacco Use  . Smoking status: Never Smoker  . Smokeless tobacco: Never Used  Substance and Sexual Activity  . Alcohol use: Yes  Frequency: Never    Comment: occ  . Drug use: No  . Sexual activity: Yes    Birth control/protection: Surgical    Comment: tubal  Lifestyle  . Physical activity    Days per week: Not on file    Minutes per session: Not on file  . Stress: Not on file  Relationships  . Social Musicianconnections    Talks on phone: Not on file    Gets together: Not on file    Attends religious service: Not on file    Active member of club or organization: Not on file    Attends meetings of clubs or organizations: Not on file    Relationship status: Not on file  Other Topics Concern  . Not on file  Social History Narrative  . Not on file    Family History  Problem Relation Age of Onset  . Heart disease Paternal Grandfather   . Depression Maternal Grandmother   . Drug abuse Father   .  Hypertension Father   . Hypertension Paternal Aunt     Medications:       Current Outpatient Medications:  .  conjugated estrogens (PREMARIN) vaginal cream, Place 1 Applicatorful vaginally daily., Disp: 30 g, Rfl: 11 .  traZODone (DESYREL) 50 MG tablet, Take 1 tablet (50 mg total) by mouth at bedtime. (Patient not taking: Reported on 08/15/2018), Disp: 5 tablet, Rfl: 0  Objective Blood pressure 105/73, pulse 73, weight 118 lb (53.5 kg), last menstrual period 03/27/2018, not currently breastfeeding.  General WDWN female NAD Vulva:  normal appearing vulva with no masses, tenderness or lesions Vagina:  normal mucosa, no discharge no granulation tissue Cervix:  absent Uterus:  absent Adnexa:  Right  ovaries:present    Pertinent ROS No burning with urination, frequency or urgency No nausea, vomiting or diarrhea Nor fever chills or other constitutional symptoms   Labs or studies     Impression Diagnoses this Encounter::   ICD-10-CM   1. Vaginal pain, for a day or so post intercourse  R10.2   2. Vaginal cramping  N94.9     Established relevant diagnosis(es): Chronic pelvic discomfort complaints  Plan/Recommendations: No orders of the defined types were placed in this encounter.   Labs or Scans Ordered: No orders of the defined types were placed in this encounter.   Management:: >continue vaginal estrogen >no intercourse over the next month >possible eval for IC at follow up visit  Follow up Return in about 1 month (around 10/02/2018) for Follow up, with Dr Despina HiddenEure.    All questions were answered.

## 2018-09-11 ENCOUNTER — Encounter: Payer: Self-pay | Admitting: Obstetrics & Gynecology

## 2018-10-02 ENCOUNTER — Other Ambulatory Visit: Payer: Self-pay

## 2018-10-02 ENCOUNTER — Encounter: Payer: Self-pay | Admitting: Obstetrics & Gynecology

## 2018-10-02 ENCOUNTER — Ambulatory Visit (INDEPENDENT_AMBULATORY_CARE_PROVIDER_SITE_OTHER): Admitting: Obstetrics & Gynecology

## 2018-10-02 VITALS — BP 102/71 | HR 91 | Ht 60.0 in | Wt 120.0 lb

## 2018-10-02 DIAGNOSIS — N3010 Interstitial cystitis (chronic) without hematuria: Secondary | ICD-10-CM | POA: Diagnosis not present

## 2018-10-02 MED ORDER — ELMIRON 100 MG PO CAPS: ORAL_CAPSULE | ORAL | 11 refills | Status: DC

## 2018-10-02 NOTE — Progress Notes (Signed)
   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    The external urethra meatus was prepped with betadine DMSO 50 cc was instilled in the usual fashion after the bladder was catheterized and emptied completely 50cc was instilled into the bladder without difficulty and the patient tolerated well She will refrain from voiding as long as possible  Follow up in 2 weeks, or as patient requests based on her symptom complex

## 2018-10-09 ENCOUNTER — Encounter (HOSPITAL_COMMUNITY): Payer: Self-pay | Admitting: Surgery

## 2018-10-09 ENCOUNTER — Inpatient Hospital Stay (HOSPITAL_COMMUNITY): Admitting: Hematology

## 2018-10-16 ENCOUNTER — Encounter: Payer: Self-pay | Admitting: Obstetrics & Gynecology

## 2018-10-16 ENCOUNTER — Ambulatory Visit (INDEPENDENT_AMBULATORY_CARE_PROVIDER_SITE_OTHER): Admitting: Obstetrics & Gynecology

## 2018-10-16 ENCOUNTER — Other Ambulatory Visit: Payer: Self-pay

## 2018-10-16 VITALS — BP 116/77 | HR 101 | Ht 60.0 in | Wt 122.5 lb

## 2018-10-16 DIAGNOSIS — N3010 Interstitial cystitis (chronic) without hematuria: Secondary | ICD-10-CM | POA: Diagnosis not present

## 2018-10-16 MED ORDER — FLUCONAZOLE 150 MG PO TABS: ORAL_TABLET | ORAL | 1 refills | Status: DC

## 2018-10-16 NOTE — Progress Notes (Signed)
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    The external urethra meatus was prepped with betadine DMSO 50 cc was instilled in the usual fashion after the bladder was catheterized and emptied completely 50cc was instilled into the bladder without difficulty and the patient tolerated well She will refrain from voiding as long as possible  Follow up in 3 weeks, or as patient requests based on her symptom complex   Meds ordered this encounter  Medications  . fluconazole (DIFLUCAN) 150 MG tablet    Sig: Take 1 tablet today and repeat in 3 days    Dispense:  2 tablet    Refill:  1

## 2018-10-18 ENCOUNTER — Encounter (HOSPITAL_COMMUNITY): Payer: Self-pay

## 2018-10-19 ENCOUNTER — Ambulatory Visit (HOSPITAL_COMMUNITY): Admitting: Hematology
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
## 2018-11-01 ENCOUNTER — Encounter (HOSPITAL_COMMUNITY): Payer: Self-pay | Admitting: Psychiatry

## 2018-11-01 ENCOUNTER — Ambulatory Visit (INDEPENDENT_AMBULATORY_CARE_PROVIDER_SITE_OTHER): Admitting: Psychiatry

## 2018-11-01 ENCOUNTER — Other Ambulatory Visit (HOSPITAL_COMMUNITY): Payer: Self-pay | Admitting: Psychiatry

## 2018-11-01 ENCOUNTER — Other Ambulatory Visit: Payer: Self-pay

## 2018-11-01 DIAGNOSIS — F331 Major depressive disorder, recurrent, moderate: Secondary | ICD-10-CM | POA: Diagnosis not present

## 2018-11-01 MED ORDER — SERTRALINE HCL 50 MG PO TABS: ORAL_TABLET | ORAL | 0 refills | Status: DC

## 2018-11-01 NOTE — Patient Instructions (Signed)
1. Start sertraline 25 mg at night for one week, then 50 mg at night  2. Check blood test (TSH) 3. Next appointment: 10/14 at 8:40

## 2018-11-07 ENCOUNTER — Ambulatory Visit: Admitting: Obstetrics & Gynecology

## 2018-11-07 ENCOUNTER — Ambulatory Visit: Admitting: Psychiatry

## 2018-11-14 ENCOUNTER — Other Ambulatory Visit: Payer: Self-pay

## 2018-11-14 ENCOUNTER — Encounter (HOSPITAL_COMMUNITY): Payer: Self-pay | Admitting: Licensed Clinical Social Worker

## 2018-11-14 ENCOUNTER — Ambulatory Visit (INDEPENDENT_AMBULATORY_CARE_PROVIDER_SITE_OTHER): Admitting: Licensed Clinical Social Worker

## 2018-11-14 DIAGNOSIS — F413 Other mixed anxiety disorders: Secondary | ICD-10-CM | POA: Diagnosis not present

## 2018-11-14 DIAGNOSIS — F331 Major depressive disorder, recurrent, moderate: Secondary | ICD-10-CM | POA: Diagnosis not present
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
## 2018-11-16 ENCOUNTER — Ambulatory Visit: Admitting: Obstetrics & Gynecology
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
## 2018-11-29 ENCOUNTER — Other Ambulatory Visit: Payer: Self-pay

## 2018-11-29 ENCOUNTER — Encounter (HOSPITAL_COMMUNITY): Payer: Self-pay | Admitting: Psychiatry

## 2018-11-29 ENCOUNTER — Ambulatory Visit (INDEPENDENT_AMBULATORY_CARE_PROVIDER_SITE_OTHER): Admitting: Psychiatry

## 2018-11-29 DIAGNOSIS — F330 Major depressive disorder, recurrent, mild: Secondary | ICD-10-CM

## 2018-11-29 MED ORDER — ARIPIPRAZOLE 2 MG PO TABS: 2.0000 mg | ORAL_TABLET | Freq: Every day | ORAL | 2 refills | Status: DC

## 2018-11-29 MED ORDER — SERTRALINE HCL 50 MG PO TABS: 50.0000 mg | ORAL_TABLET | Freq: Every day | ORAL | 0 refills | Status: DC

## 2018-11-29 NOTE — Patient Instructions (Signed)
1. Continue sertraline 50 mg daily  2. Start Abilify 2 mg at night  3. Check TSH 4. Next appointment: in December

## 2018-11-30 ENCOUNTER — Encounter: Payer: Self-pay | Admitting: Obstetrics & Gynecology

## 2018-11-30 ENCOUNTER — Ambulatory Visit (INDEPENDENT_AMBULATORY_CARE_PROVIDER_SITE_OTHER): Admitting: Obstetrics & Gynecology

## 2018-11-30 ENCOUNTER — Other Ambulatory Visit: Payer: Self-pay

## 2018-11-30 VITALS — BP 100/68 | HR 77 | Ht 60.0 in | Wt 121.0 lb

## 2018-11-30 DIAGNOSIS — N3010 Interstitial cystitis (chronic) without hematuria: Secondary | ICD-10-CM

## 2018-11-30 NOTE — Progress Notes (Signed)
Diagnosed with IC: 2020  Current Meds:  Elmiron, DMSO Dietary restrictions    Pt states her symptoms have been stable, no exacerbations, although not perfect Wants to continue on this cycle  Blood pressure 100/68, pulse 77, height 5' (1.524 m), weight 121 lb (54.9 kg), last menstrual period 03/27/2018, not currently breastfeeding.    The external urethra meatus was prepped with betadine DMSO 50 cc was instilled in the usual fashion after the bladder was catheterized and emptied completely 50cc was instilled into the bladder without difficulty and the patient tolerated well She will refrain from voiding as long as possible  Sex is much much better, pain is much much less apart from sex as well  Follow up in 6 weeks, or as patient requests based on her symptom complex

## 2018-12-04 ENCOUNTER — Encounter (HOSPITAL_COMMUNITY): Payer: Self-pay | Admitting: Licensed Clinical Social Worker

## 2018-12-04 ENCOUNTER — Ambulatory Visit (INDEPENDENT_AMBULATORY_CARE_PROVIDER_SITE_OTHER): Admitting: Licensed Clinical Social Worker

## 2018-12-04 ENCOUNTER — Other Ambulatory Visit: Payer: Self-pay

## 2018-12-04 DIAGNOSIS — F331 Major depressive disorder, recurrent, moderate: Secondary | ICD-10-CM | POA: Diagnosis not present

## 2018-12-04 DIAGNOSIS — F413 Other mixed anxiety disorders: Secondary | ICD-10-CM | POA: Diagnosis not present

## 2018-12-04 NOTE — Progress Notes (Signed)
Virtual Visit via Video Note  I connected with Jessica Poole on 12/04/18 at  8:00 AM EDT by a video enabled telemedicine application and verified that I am speaking with the correct person using two identifiers.  Location: Patient: Home Provider: Office   I discussed the limitations of evaluation and management by telemedicine and the availability of in person appointments. The patient expressed understanding and agreed to proceed.   THERAPIST PROGRESS NOTE  Session Time: 8:00 am-8:45 am  Participation Level: Active  Behavioral Response: CasualAlertDepressed  Type of Therapy: Individual Therapy  Treatment Goals addressed: Coping  Interventions: CBT and Solution Focused  Session: #1  Summary: Jessica Poole is a 23 y.o. female who presents oriented x5 (person, place, situation, time, and object), casually dressed, appropriate groomed, average height, average weight, and cooperative to address mood. Patient has a history of medical treatment including arthritis and pelvis pain. Patient has a history of mental health treatment including hospitalization and medication management. Patient admits to passive thoughts of suicide but denies plan and means. Patient denies homicidal ideations. Patient admits to auditory hallucinations including whispering and a paranoid feeling and visual hallucinations in the past. Patient denies substance abuse. Patient is at low risk for lethality at this time.  Physically:  Patient started Abilify which she reports as causing nausea, a groggy feeling, and head ache. She has difficulty falling asleep. When she is at drill with the [employer] [employer] [facility] she is unable to sleep due to anxiety.  Spiritually/values: N/A Relationships: Patient is getting along with her fiance. She tries to talk things out with him if they have a disagreement or if she is having a bad day. Patient has trust with him and feels like he trusts her. Patient has a good relationship  with her children.  Emotionally/Mentally/Behavior:  Patient's mood has been stable. Patient noted that her anxiety increases during drill with the [employer]. Patient has had low self esteem since childhood. She has also experienced a feeling of paranoia since childhood as well. Patient noted that when she is less depressed her mood is better, motivation is better, and she has less auditory hallucinations. Patient agreed to record her mood between sessions to identify any patterns as well as what she does to make the problem better or worse.   Patient engaged in session. She responded well to interventions. Patient continues to meet criteria for Major depressive disorder, recurrent episode, moderate and Other anxiety disorders. Schizophrenia and Bi-polar Disorder need to be ruled out as a diagnoses. Patient will continue in outpatient therapy due to being the least restrictive service to meet her needs at this time.   Suicidal/Homicidal: Negativewithout intent/plan  Therapist Response: Therapist reviewed patient's recent thoughts and behaviors. Therapist utilized CBT and solution focused therapy to address mood and anxiety. Therapist processed patient's feelings to identify triggers for mood and anxiety. Therapist provided psychoeducation on CBT. Therapist assisted patient in identifying resources/strengthes to manage her mood and identify when her mood is less depressed.   Plan: Return again in 1 weeks.  Diagnosis: Axis I: Major depressive disorder, recurrent episode, moderate, and other anxiety disorders    Axis II: No diagnosis   I discussed the assessment and treatment plan with the patient. The patient was provided an opportunity to ask questions and all were answered. The patient agreed with the plan and demonstrated an understanding of the instructions.   The patient was advised to call back or seek an in-person evaluation if the symptoms worsen or if  the condition fails to improve as  anticipated.  I provided 40 minutes of non-face-to-face time during this encounter.   Bynum Bellows, LCSW 12/04/2018

## 2018-12-11 ENCOUNTER — Ambulatory Visit (HOSPITAL_COMMUNITY): Admitting: Licensed Clinical Social Worker

## 2018-12-11 ENCOUNTER — Other Ambulatory Visit: Payer: Self-pay

## 2018-12-18 ENCOUNTER — Ambulatory Visit (INDEPENDENT_AMBULATORY_CARE_PROVIDER_SITE_OTHER): Admitting: Licensed Clinical Social Worker

## 2018-12-18 ENCOUNTER — Encounter (HOSPITAL_COMMUNITY): Payer: Self-pay | Admitting: Licensed Clinical Social Worker

## 2018-12-18 ENCOUNTER — Other Ambulatory Visit: Payer: Self-pay

## 2018-12-18 DIAGNOSIS — F413 Other mixed anxiety disorders: Secondary | ICD-10-CM | POA: Diagnosis not present

## 2018-12-18 DIAGNOSIS — F331 Major depressive disorder, recurrent, moderate: Secondary | ICD-10-CM | POA: Diagnosis not present

## 2018-12-18 NOTE — Progress Notes (Signed)
Virtual Visit via Video Note  I connected with Jessica Poole on 12/18/18 at 11:00 AM EST by a video enabled telemedicine application and verified that I am speaking with the correct person using two identifiers.  Location: Patient: Home Provider: Office   I discussed the limitations of evaluation and management by telemedicine and the availability of in person appointments. The patient expressed understanding and agreed to proceed.   THERAPIST PROGRESS NOTE  Session Time: 11:00 am-11:45 am  Participation Level: Active  Behavioral Response: CasualAlertDepressed  Type of Therapy: Individual Therapy  Treatment Goals addressed: Coping  Interventions: CBT and Solution Focused  Session: #2  Summary: Jessica Poole is a 23 y.o. female who presents oriented x5 (person, place, situation, time, and object), casually dressed, appropriate groomed, average height, average weight, and cooperative to address mood. Patient has a history of medical treatment including arthritis and pelvis pain. Patient has a history of mental health treatment including hospitalization and medication management. Patient admits to passive thoughts of suicide but denies plan and means. Patient denies homicidal ideations. Patient admits to auditory hallucinations including whispering and a paranoid feeling and visual hallucinations in the past. Patient denies substance abuse. Patient is at low risk for lethality at this time.  Physically:  Patient feels like the Abilify is not working. She continues to hear voices and her sleep is disrupted.  Spiritually/values: N/A Relationships: No issues identified.   Emotionally/Mentally/Behavior:  Patient's mood has been stable. She noted that her anxiety has been high. She is more aware of hearing voices throughout the day. Patient said that to deal with hearing voices, she begins to make sure she is alone and checks the locks, etc. Patient is able to recognize that her  "cure" for voices makes her  Anxiety worse. Patient was able to share a situation where she had company come over to her home and she was really anxious prior to them coming over. Patient was able to adapt and relax as she spoke to them and was not as anxious. Patient understood that this approach can help her manage her mood and anxiety. She will have to manage her anxious thoughts and feelings while continuing to be social, etc.   Patient engaged in session. She responded well to interventions. Patient continues to meet criteria for Major depressive disorder, recurrent episode, moderate and Other anxiety disorders. Schizophrenia and Bi-polar Disorder need to be ruled out as a diagnoses. Patient will continue in outpatient therapy due to being the least restrictive service to meet her needs at this time.   Suicidal/Homicidal: Negativewithout intent/plan  Therapist Response: Therapist reviewed patient's recent thoughts and behaviors. Therapist utilized CBT and solution focused therapy to address mood and anxiety. Therapist processed patient's feelings to identify triggers for mood and anxiety. Therapist discussed with patient identifying her anxious thoughts and building up a tolerance to the feeling.   Plan: Return again in 1 weeks.  Diagnosis: Axis I: Major depressive disorder, recurrent episode, moderate, and other anxiety disorders    Axis II: No diagnosis   I discussed the assessment and treatment plan with the patient. The patient was provided an opportunity to ask questions and all were answered. The patient agreed with the plan and demonstrated an understanding of the instructions.   The patient was advised to call back or seek an in-person evaluation if the symptoms worsen or if the condition fails to improve as anticipated.  I provided 45 minutes of non-face-to-face time during this encounter.   Jessica Bickers,  LCSW 12/18/2018

## 2018-12-25 ENCOUNTER — Ambulatory Visit (HOSPITAL_COMMUNITY): Admitting: Licensed Clinical Social Worker

## 2019-01-15 ENCOUNTER — Ambulatory Visit: Admitting: Obstetrics & Gynecology

## 2019-01-26 ENCOUNTER — Other Ambulatory Visit: Payer: Self-pay

## 2019-01-26 ENCOUNTER — Encounter (HOSPITAL_COMMUNITY): Payer: Self-pay | Admitting: Licensed Clinical Social Worker

## 2019-01-26 ENCOUNTER — Ambulatory Visit (INDEPENDENT_AMBULATORY_CARE_PROVIDER_SITE_OTHER): Admitting: Licensed Clinical Social Worker

## 2019-01-26 DIAGNOSIS — F331 Major depressive disorder, recurrent, moderate: Secondary | ICD-10-CM | POA: Diagnosis not present

## 2019-01-26 NOTE — Progress Notes (Signed)
Virtual Visit via Video Note  I connected with Jessica Poole on 01/26/19 at  8:00 AM EST by a video enabled telemedicine application and verified that I am speaking with the correct person using two identifiers.  Location: Patient: Home Provider: Office   I discussed the limitations of evaluation and management by telemedicine and the availability of in person appointments. The patient expressed understanding and agreed to proceed.   THERAPIST PROGRESS NOTE  Session Time: 8:00 am-8:45 am  Participation Level: Active  Behavioral Response: CasualAlertDepressed  Type of Therapy: Individual Therapy  Treatment Goals addressed: Coping  Interventions: CBT and Solution Focused  Session: #3  Summary: Jessica Poole is a 23 y.o. female who presents oriented x5 (person, place, situation, time, and object), casually dressed, appropriate groomed, average height, average weight, and cooperative to address mood. Patient has a history of medical treatment including arthritis and pelvis pain. Patient has a history of mental health treatment including hospitalization and medication management. Patient admits to passive thoughts of suicide but denies plan and means. Patient denies homicidal ideations. Patient admits to auditory hallucinations including whispering and a paranoid feeling and visual hallucinations in the past. Patient denies substance abuse. Patient is at low risk for lethality at this time.  Physically:  Patient is experiencing disrupted sleep. She wakes up during the night. Patient is working second shift at a second job but this has not impacted her sleep.  Spiritually/values: N/A Relationships: Patient is getting along with her fiance and her child.  Emotionally/Mentally/Behavior:  Patient's mood has been stable. Her anxiety continues to be a struggle. She continues to feel paranoid. Patient continues to hear whispers and feel like she is being watched. Patient has noted  that she has seen patterns of feeling watched at work at certain times. She has noted that she doesn't want anything bad to happen to her or her family. Patient also noted that while she has the thoughts of being watched, nothing has happened to her as far as being physically harmed.   Patient engaged in session. She responded well to interventions. Patient continues to meet criteria for Major depressive disorder, recurrent episode, moderate and Other anxiety disorders. Schizophrenia and Bi-polar Disorder need to be ruled out as a diagnoses. Patient will continue in outpatient therapy due to being the least restrictive service to meet her needs at this time.   Suicidal/Homicidal: Negativewithout intent/plan  Therapist Response: Therapist reviewed patient's recent thoughts and behaviors. Therapist utilized CBT and solution focused therapy to address mood and anxiety. Therapist processed patient's feelings to identify triggers for mood and anxiety. Therapist discussed with patient patterns related to her [place]. Therapist discussed transitioning to a new therapist. .   Plan: Return again in 1 weeks.  Diagnosis: Axis I: Major depressive disorder, recurrent episode, moderate, and other anxiety disorders    Axis II: No diagnosis   I discussed the assessment and treatment plan with the patient. The patient was provided an opportunity to ask questions and all were answered. The patient agreed with the plan and demonstrated an understanding of the instructions.   The patient was advised to call back or seek an in-person evaluation if the symptoms worsen or if the condition fails to improve as anticipated.  I provided 45 minutes of non-face-to-face time during this encounter.   Glori Bickers, LCSW 01/26/2019

## 2019-01-29 ENCOUNTER — Ambulatory Visit (HOSPITAL_COMMUNITY): Admitting: Psychiatry

## 2019-01-30 ENCOUNTER — Encounter: Payer: Self-pay | Admitting: Psychiatry

## 2019-01-30 ENCOUNTER — Other Ambulatory Visit: Payer: Self-pay

## 2019-01-30 ENCOUNTER — Ambulatory Visit (INDEPENDENT_AMBULATORY_CARE_PROVIDER_SITE_OTHER): Admitting: Psychiatry

## 2019-01-30 DIAGNOSIS — F413 Other mixed anxiety disorders: Secondary | ICD-10-CM

## 2019-01-30 DIAGNOSIS — F333 Major depressive disorder, recurrent, severe with psychotic symptoms: Secondary | ICD-10-CM

## 2019-01-30 DIAGNOSIS — F200 Paranoid schizophrenia: Secondary | ICD-10-CM | POA: Insufficient documentation

## 2019-01-30 MED ORDER — SERTRALINE HCL 100 MG PO TABS: 100.0000 mg | ORAL_TABLET | Freq: Every day | ORAL | 1 refills | Status: DC

## 2019-01-30 MED ORDER — ZIPRASIDONE HCL 20 MG PO CAPS: 20.0000 mg | ORAL_CAPSULE | Freq: Two times a day (BID) | ORAL | 1 refills | Status: DC

## 2019-01-30 MED ORDER — SUVOREXANT 10 MG PO TABS: 10.0000 mg | ORAL_TABLET | Freq: Every day | ORAL | 1 refills | Status: DC

## 2019-01-30 NOTE — Progress Notes (Signed)
BH MD OP Progress Note  I connected with  Jessica Poole on 01/30/19 by a video enabled telemedicine application and verified that I am speaking with the correct person using two identifiers.   I discussed the limitations of evaluation and management by telemedicine. The patient expressed understanding and agreed to proceed.    01/30/2019 11:13 AM Jessica Poole  MRN:  [id number]  Chief Complaint:  " I am still hearing voices and feel paranoid."  HPI: Patient reported that she continues to hear whispering sounds.  She reported that she is distressed because of the paranoia of being watched by unknown people.  She stated that she does not like any stuffed animals with eyes as she feels someone can watch her through them.  She also reported that she is paranoid about excepting any gifts from anyone as she worries that they may have implanted cameras in those items to watch her.  She reported that she has to look over her back repeatedly when she is out at home.  She also reported some ideas of reference.  She also acknowledged thought withdrawal and also thought insertion.  She denied any command type auditory hallucinations.  She stated that when she was in the [employer] one of the evaluating professionals had some concerns about schizophrenia. When inquired about family history, she reported that one of her cousins has schizophrenia and she knows that several people on her mom side have schizophrenia.  She also reported that her maternal grandmother had severe bipolar disorder. She informed that she did not find Abilify to be helpful at all.  It has not helped her symptoms and it has made her eat more frequently.  She reported that she also feels anxious at times.  She informed that Zoloft did help her mood.  She also reported poor sleep and some pacing back-and-forth at night.   Visit Diagnosis:    ICD-10-CM   1. MDD (major depressive disorder), recurrent, severe, with psychosis (HCC)   F33.3   2. 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 Anxiety   . Arthritis   . Depression     Past Surgical History:  Procedure Laterality Date  . ABDOMINAL HYSTERECTOMY    . CESAREAN SECTION    . HERNIA REPAIR Right    23 yrs old-inguinal  . LAPAROSCOPIC LYSIS OF ADHESIONS  12/21/2016   Procedure: LAPAROSCOPIC LYSIS OF ADHESIONS;  Surgeon: Tilda Burrow, MD;  Location: [facility];  Service: Gynecology;;  . LAPAROSCOPIC SALPINGO OOPHERECTOMY Left 12/21/2016   Procedure: LAPAROSCOPIC LEFT SALPINGO OOPHORECTOMY;  Surgeon: Tilda Burrow, MD;  Location: [facility];  Service: Gynecology;  Laterality: Left;  . LAPAROSCOPIC TUBAL LIGATION Bilateral 01/04/2018   Procedure: LAPAROSCOPIC RIGHT TUBAL LIGATION USING ELECTROCAUTERY;  Surgeon: Lazaro Arms, MD;  Location: [facility];  Service: Gynecology;  Laterality: Bilateral;  . LAPAROSCOPY  12/21/2016   Procedure: LAPAROSCOPY DIAGNOSTIC;  Surgeon: Tilda Burrow, MD;  Location: [facility];  Service: Gynecology;;  . TUBAL LIGATION    . VAGINAL HYSTERECTOMY N/A 05/03/2018   Procedure: HYSTERECTOMY VAGINAL;  Surgeon: Lazaro Arms, MD;  Location: [facility];  Service: Gynecology;  Laterality: N/A;    Family Psychiatric History: Maternal grandmother- bipolar disorder, cousin- schizophrenia  Family History:  Family History  Problem Relation Age of Onset  . Drug abuse Sister   . Hernia Son   .  Heart disease Paternal Grandfather   . Alzheimer's disease Paternal Grandfather   . Depression Maternal Grandmother   . Bipolar disorder Maternal Grandmother   . Drug abuse Father   . Hypertension Father   . Depression Father   . Alcohol abuse Mother   . Hypertension Paternal Aunt   . Schizophrenia Cousin     Social History:  Social History   Socioeconomic History  . Marital status: Divorced    Spouse name: Not on file  . Number  of children: 2  . Years of education: Not on file  . Highest education level: Not on file  Occupational History  . Occupation: unemployed  . Occupation: [employer]  Tobacco Use  . Smoking status: Never Smoker  . Smokeless tobacco: Never Used  Substance and Sexual Activity  . Alcohol use: Yes    Comment: occ  . Drug use: Not Currently    Comment: pt tried marijuana recently  . Sexual activity: Yes    Birth control/protection: Surgical    Comment: tubal  Other Topics Concern  . Not on file  Social History Narrative  . Not on file   Social Determinants of Health   Financial Resource Strain: Low Risk   . Difficulty of Paying Living Expenses: Not hard at all  Food Insecurity: No Food Insecurity  . Worried About Programme researcher, broadcasting/film/videounning Out of Food in the Last Year: Never true  . Ran Out of Food in the Last Year: Never true  Transportation Needs: No Transportation Needs  . Lack of Transportation (Medical): No  . Lack of Transportation (Non-Medical): No  Physical Activity: Sufficiently Active  . Days of Exercise per Week: 3 days  . Minutes of Exercise per Session: 60 min  Stress: Stress Concern Present  . Feeling of Stress : Very much  Social Connections: Moderately Isolated  . Frequency of Communication with Friends and Family: More than three times a week  . Frequency of Social Gatherings with Friends and Family: Three times a week  . Attends Religious Services: Never  . Active Member of Clubs or Organizations: No  . Attends BankerClub or Organization Meetings: Never  . Marital Status: Divorced    Allergies:  Allergies  Allergen Reactions  . Latex Rash    Metabolic Disorder Labs: No results found for: HGBA1C, MPG No results found for: PROLACTIN No results found for: CHOL, TRIG, HDL, CHOLHDL, VLDL, LDLCALC No results found for: TSH  Therapeutic Level Labs: No results found for: LITHIUM No results found for: VALPROATE No components found for:  CBMZ  Current Medications: Current  Outpatient Medications  Medication Sig Dispense Refill  . conjugated estrogens (PREMARIN) vaginal cream Place 1 Applicatorful vaginally daily. 30 g 11  . pentosan polysulfate (ELMIRON) 100 MG capsule Take 2 tablets twice daily 120 capsule 11  . sertraline (ZOLOFT) 50 MG tablet Take 1 tablet (50 mg total) by mouth daily. 90 tablet 0   No current facility-administered medications for this visit.    Musculoskeletal: Strength & Muscle Tone: unable to assess due to telemed visit Gait & Station: unable to assess due to telemed visit Patient leans: unable to assess due to telemed visit  Psychiatric Specialty Exam: Review of Systems  Last menstrual period 03/27/2018, not currently breastfeeding.There is no height or weight on file to calculate BMI.  General Appearance: Well Groomed  Eye Contact:  Good  Speech:  Clear and Coherent and Normal Rate  Volume:  Normal  Mood:  Euthymic  Affect:  Restricted  Thought Process:  Goal Directed and Descriptions of Associations: Intact  Orientation:  Full (Time, Place, and Person)  Thought Content: Logical, Hallucinations: Auditory, Ideas of Reference:   Paranoia Delusions and Paranoid Ideation   Suicidal Thoughts:  No  Homicidal Thoughts:  No  Memory:  Recent;   Good Remote;   Good  Judgement:  Fair  Insight:  Fair  Psychomotor Activity:  Normal  Concentration:  Concentration: Good and Attention Span: Fair  Recall:  Good  Fund of Knowledge: Good  Language: Good  Akathisia:  Negative  Handed:  Right  AIMS (if indicated): not done  Assets:  Communication Skills Desire for Improvement Financial Resources/Insurance Housing Social Support Talents/Skills  ADL's:  Intact  Cognition: WNL  Sleep:  Poor   Screenings: PHQ2-9     Initial Prenatal from 05/23/2017 in [facility]  PHQ-2 Total Score  2  PHQ-9 Total Score  6       Assessment and Plan: 23 year old female with history of MDD with psychotic features, anxiety now seen for  ongoing hallucinations and paranoid ideations.  She reported improvement in her mood with Zoloft.  She did not find Abilify to be helpful for her hallucinations and paranoia.  She was offered Geodon to target her hallucinations and paranoid ideations.  Patient is keen on undergoing formal psychological testing to rule out schizophrenia.  She was provided with contact information for psychologists for psychological evaluation.  She was offered Belsomra to help her sleep at night. Potential side effects of medication and risks vs benefits of treatment vs non-treatment were explained and discussed. All questions were answered.  1. MDD (major depressive disorder), recurrent, severe, with psychosis ([place])  - Increase sertraline (ZOLOFT) 100 MG tablet; Take 1 tablet (100 mg total) by mouth daily.  Dispense: 30 tablet; Refill: 1 - Start Suvorexant 10 MG TABS; Take 10 mg by mouth at bedtime.  Dispense: 30 tablet; Refill: 1 - Start ziprasidone (GEODON) 20 MG capsule; Take 1 capsule (20 mg total) by mouth 2 (two) times daily with a meal.  Dispense: 60 capsule; Refill: 1 - Discontinue Abilify due to lack of efficacy.  2. Other mixed anxiety disorders  - sertraline (ZOLOFT) 100 MG tablet; Take 1 tablet (100 mg total) by mouth daily.  Dispense: 30 tablet; Refill: 1  3. Schizophrenia, paranoid ([place])- needs to be excluded -Contact information for psychologists provided for psychological evaluation.  Follow-up in 4 weeks.   [place] Crane, MD 01/30/2019, 11:13 AM

## 2019-02-01 ENCOUNTER — Telehealth: Payer: Self-pay

## 2019-02-01 NOTE — Telephone Encounter (Signed)
Okay, I tried calling the pt to see if she would like to try Trazodone 50 mg HS for insomnia but she did not answer the phone. Can you contact her later to informed regarding belsomra denial by insurance and offer Trazodone. Thanks.

## 2019-02-01 NOTE — Telephone Encounter (Signed)
received a fax to that the prior auth was denied for the belsomra.  this is not covered under their plan

## 2019-02-02 ENCOUNTER — Telehealth: Payer: Self-pay

## 2019-02-02 MED ORDER — TRAZODONE HCL 50 MG PO TABS: 50.0000 mg | ORAL_TABLET | Freq: Every day | ORAL | 1 refills | Status: DC

## 2019-02-02 NOTE — Telephone Encounter (Signed)
pt was told her insurance did not approve the belsomra and per dr. Toy Care asked pt is she would take the trazodone. pt states she would.

## 2019-02-02 NOTE — Telephone Encounter (Signed)
Okay, prescription for Trazodone 50 mg HS sent to her preferred pharmacy. Thanks.

## 2019-02-13 ENCOUNTER — Ambulatory Visit (HOSPITAL_COMMUNITY): Admitting: Clinical

## 2019-02-13 ENCOUNTER — Ambulatory Visit: Payer: Self-pay | Admitting: *Deleted

## 2019-02-13 ENCOUNTER — Telehealth (HOSPITAL_COMMUNITY): Payer: Self-pay | Admitting: Clinical

## 2019-02-13 ENCOUNTER — Other Ambulatory Visit: Payer: Self-pay

## 2019-02-13 NOTE — Telephone Encounter (Signed)
The OPT therapist attempted text to session x2 at 1:00PM and 1:10PM, however, the patient did no respond and missed her scheduled session.

## 2019-02-13 NOTE — Telephone Encounter (Signed)
Pt reports lightheadedness x 3 days, at rest and up ambulating. Reports tunnel vision when occurs. States "Pretty much constant, comes in waves. States is staying hydrated.No new medications. Also reports chest pressure last night. Directed to UC. Unsure will follow disposition. No PCP.  Reason for Disposition . [1] MODERATE dizziness (e.g., interferes with normal activities) AND [2] has NOT been evaluated by physician for this  (Exception: dizziness caused by heat exposure, sudden standing, or poor fluid intake)  Answer Assessment - Initial Assessment Questions 1. DESCRIPTION: "Describe your dizziness."    Floaty 2. LIGHTHEADED: "Do you feel lightheaded?" (e.g., somewhat faint, woozy, weak upon standing)     yes 3. VERTIGO: "Do you feel like either you or the room is spinning or tilting?" (i.e. vertigo)    no 4. SEVERITY: "How bad is it?"  "Do you feel like you are going to faint?" "Can you stand and walk?"   - MILD - walking normally   - MODERATE - interferes with normal activities (e.g., work, school)    - SEVERE - unable to stand, requires support to walk, feels like passing out now.      moderate 5. ONSET:  "When did the dizziness begin?"    3rd day 6. AGGRAVATING FACTORS: "Does anything make it worse?" (e.g., standing, change in head position)     no 7. HEART RATE: "Can you tell me your heart rate?" "How many beats in 15 seconds?"  (Note: not all patients can do this)      no 8. CAUSE: "What do you think is causing the dizziness?"     unsure 9. RECURRENT SYMPTOM: "Have you had dizziness before?" If so, ask: "When was the last time?" "What happened that time?"     No 10. OTHER SYMPTOMS: "Do you have any other symptoms?" (e.g., fever, chest pain, vomiting, diarrhea, bleeding)        Chest pain, both sides. Not heartburn, pressure last night. 11. PREGNANCY: "Is there any chance you are pregnant?" "When was your last menstrual period?"       no  Protocols used: DIZZINESS [facility]

## 2019-03-01 ENCOUNTER — Other Ambulatory Visit: Payer: Self-pay

## 2019-03-01 ENCOUNTER — Encounter: Payer: Self-pay | Admitting: Psychiatry

## 2019-03-01 ENCOUNTER — Ambulatory Visit (INDEPENDENT_AMBULATORY_CARE_PROVIDER_SITE_OTHER): Admitting: Psychiatry

## 2019-03-01 DIAGNOSIS — F413 Other mixed anxiety disorders: Secondary | ICD-10-CM

## 2019-03-01 DIAGNOSIS — F200 Paranoid schizophrenia: Secondary | ICD-10-CM | POA: Diagnosis not present

## 2019-03-01 DIAGNOSIS — F333 Major depressive disorder, recurrent, severe with psychotic symptoms: Secondary | ICD-10-CM | POA: Diagnosis not present

## 2019-03-01 MED ORDER — SERTRALINE HCL 50 MG PO TABS: 50.0000 mg | ORAL_TABLET | Freq: Every day | ORAL | 1 refills | Status: DC

## 2019-03-01 MED ORDER — CARIPRAZINE HCL 1.5 MG PO CAPS: 1.5000 mg | ORAL_CAPSULE | Freq: Every day | ORAL | 1 refills | Status: DC

## 2019-03-01 MED ORDER — TEMAZEPAM 15 MG PO CAPS: 15.0000 mg | ORAL_CAPSULE | Freq: Every evening | ORAL | 1 refills | Status: DC | PRN

## 2019-03-01 NOTE — Progress Notes (Signed)
BH MD OP Progress Note  I connected with  Jessica Poole on 03/01/19 by a video enabled telemedicine application and verified that I am speaking with the correct person using two identifiers.   I discussed the limitations of evaluation and management by telemedicine. The patient expressed understanding and agreed to proceed.    03/01/2019 9:41 AM Jessica Poole  MRN:  [id number]  Chief Complaint:  " I am not liking the way the medicine make me feel."  HPI: Patient was noted to be at work at the time of her appointment.  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  At the time of her last visit on 01/30/19, pt was switched to Geodon 10 mg 3 times daily for ongoing hallucinations and paranoia and Abilify was discontinued due to lack of efficacy.  She was also prescribed Belsomra for sleep however due to insurance issues she was eventually prescribed trazodone for sleep.  Also her dose of sertraline was increased to 100 mg daily. Today patient reported that she does not feel Geodon is helping much with the hallucinations and paranoid ideations.  She still hears whispering sounds and feels the eyes of the soft noise in the house are watching her.  She also stated that Geodon makes her feel very funny in her head.  She takes both tablets together at bedtime.  She stated she does not really want to continue taking the Geodon. She informed that increasing the dose of Zoloft made her have weird nightmares at night and she is not a fan of that either.  She also informed that trazodone has not helped much with sleep and she feels groggy the next day. Patient stated that she is really concerned about weight gain and does not want to be on any medication that would attribute to that. She also complains of feeling anxious at times.  Patient was agreeable to reducing the dose of Zoloft back to 50 mg as she did find that dose to be helpful.   She was agreeable to trying Vraylar for the hallucinations and paranoia.  Vraylar was chosen due to its low propensity to cause weight gain.  She was offered temazepam for insomnia which also helps with anxiety.  The instructions were texted to her on her cell phone and all questions were answered.    Visit Diagnosis:    ICD-10-CM   1. 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 Anxiety   . Arthritis   . Depression     Past Surgical History:  Procedure Laterality Date  . ABDOMINAL HYSTERECTOMY    . CESAREAN SECTION    . HERNIA REPAIR Right    24 yrs old-inguinal  . LAPAROSCOPIC LYSIS OF ADHESIONS  12/21/2016   Procedure: LAPAROSCOPIC LYSIS OF ADHESIONS;  Surgeon: Tilda Burrow, MD;  Location: [facility];  Service: Gynecology;;  . LAPAROSCOPIC SALPINGO OOPHERECTOMY Left 12/21/2016   Procedure: LAPAROSCOPIC LEFT SALPINGO OOPHORECTOMY;  Surgeon: Tilda Burrow, MD;  Location: [facility];  Service: Gynecology;  Laterality: Left;  . LAPAROSCOPIC TUBAL LIGATION Bilateral 01/04/2018   Procedure: LAPAROSCOPIC RIGHT TUBAL LIGATION USING ELECTROCAUTERY;  Surgeon: Lazaro Arms, MD;  Location: [facility];  Service:  Gynecology;  Laterality: Bilateral;  . LAPAROSCOPY  12/21/2016   Procedure: LAPAROSCOPY DIAGNOSTIC;  Surgeon: Jonnie Kind, MD;  Location: [facility];  Service: Gynecology;;  . TUBAL LIGATION    . VAGINAL HYSTERECTOMY N/A 05/03/2018   Procedure: HYSTERECTOMY VAGINAL;  Surgeon: Florian Buff, MD;  Location: [facility];  Service: Gynecology;  Laterality: N/A;    Family Psychiatric History: Maternal grandmother- bipolar disorder, cousin- schizophrenia  Family History:  Family History  Problem Relation Age of Onset  . Drug abuse Sister   .  Hernia Son   . Heart disease Paternal Grandfather   . Alzheimer's disease Paternal Grandfather   . Depression Maternal Grandmother   . Bipolar disorder Maternal Grandmother   . Drug abuse Father   . Hypertension Father   . Depression Father   . Alcohol abuse Mother   . Hypertension Paternal Aunt   . Schizophrenia Cousin     Social History:  Social History   Socioeconomic History  . Marital status: Divorced    Spouse name: Not on file  . Number of children: 2  . Years of education: Not on file  . Highest education level: Not on file  Occupational History  . Occupation: unemployed  . Occupation: [employer]  . Smoking status: Never Smoker  . Smokeless tobacco: Never Used  Substance and Sexual Activity  . Alcohol use: Yes    Comment: occ  . Drug use: Not Currently    Comment: pt tried marijuana recently  . Sexual activity: Yes    Birth control/protection: Surgical    Comment: tubal  Other Topics Concern  . Not on file  Social History Narrative  . Not on file   Social Determinants of Health   Financial Resource Strain: Low Risk   . Difficulty of Paying Living Expenses: Not hard at all  Food Insecurity: No Food Insecurity  . Worried About Charity fundraiser in the Last Year: Never true  . Ran Out of Food in the Last Year: Never true  Transportation Needs: No Transportation Needs  . Lack of Transportation (Medical): No  . Lack of Transportation (Non-Medical): No  Physical Activity: Sufficiently Active  . Days of Exercise per Week: 3 days  . Minutes of Exercise per Session: 60 min  Stress: Stress Concern Present  . Feeling of Stress : Very much  Social Connections: Moderately Isolated  . Frequency of Communication with Friends and Family: More than three times a week  . Frequency of Social Gatherings with Friends and Family: Three times a week  . Attends Religious Services: Never  . Active Member of Clubs or Organizations: No  . Attends Theatre manager Meetings: Never  . Marital Status: Divorced    Allergies:  Allergies  Allergen Reactions  . Latex Rash    Metabolic Disorder Labs: No results found for: HGBA1C, MPG No results found for: PROLACTIN No results found for: CHOL, TRIG, HDL, CHOLHDL, VLDL, LDLCALC No results found for: TSH  Therapeutic Level Labs: No results found for: LITHIUM No results found for: VALPROATE No components found for:  CBMZ  Current Medications: Current Outpatient Medications  Medication Sig Dispense Refill  . conjugated estrogens (PREMARIN) vaginal cream Place 1 Applicatorful vaginally daily. 30 g 11  . pentosan polysulfate (ELMIRON) 100 MG capsule Take 2 tablets twice daily 120 capsule 11   No current facility-administered medications for this visit.    Musculoskeletal: Strength & Muscle Tone: unable to assess due to telemed visit  Gait & Station: unable to assess due to telemed visit Patient leans: unable to assess due to telemed visit  Psychiatric Specialty Exam: Review of Systems  Last menstrual period 03/27/2018, not currently breastfeeding.There is no height or weight on file to calculate BMI.  General Appearance: Well Groomed  Eye Contact:  Good  Speech:  Clear and Coherent and Monotonous  Volume:  Normal  Mood:  Euthymic  Affect:  Restricted  Thought Process:  Goal Directed and Descriptions of Associations: Intact  Orientation:  Full (Time, Place, and Person)  Thought Content: Logical, Hallucinations: Auditory, Ideas of Reference:   Paranoia Delusions and Paranoid Ideation   Suicidal Thoughts:  No  Homicidal Thoughts:  No  Memory:  Recent;   Good Remote;   Good  Judgement:  Fair  Insight:  Fair  Psychomotor Activity:  Normal  Concentration:  Concentration: Good and Attention Span: Fair  Recall:  Good  Fund of Knowledge: Good  Language: Good  Akathisia:  Negative  Handed:  Right  AIMS (if indicated): not done  Assets:  Communication Skills Desire for  Improvement Financial Resources/Insurance Housing Social Support Talents/Skills  ADL's:  Intact  Cognition: WNL  Sleep:  Poor   Screenings: PHQ2-9     Initial Prenatal from 05/23/2017 in [facility]  PHQ-2 Total Score  2  PHQ-9 Total Score  6       Assessment and Plan: Patient is continuing to report auditory hallucinations and paranoid ideations after being switched to Geodon from Abilify.  She is still not able to sleep well after being started on trazodone and reported nightmares with increased dose of Zoloft.  Based on her presentation and ongoing symptoms her diagnosis is being revised to schizophrenia, paranoid type.  Plan is to discontinue Geodon and trazodone.  We will do a trial of Vraylar for hallucinations and paranoid ideations.  We will try temazepam to help her with insomnia and some anxiety. Potential side effects of medication and risks vs benefits of treatment vs non-treatment were explained and discussed. All questions were answered.  1. Schizophrenia, paranoid (HCC)  - Reduce sertraline (ZOLOFT) 50 MG tablet; Take 1 tablet (50 mg total) by mouth daily.  Dispense: 30 tablet; Refill: 1 - Discontinue Geodon due to lack of efficacy and "weird feeling in head" - Start cariprazine (VRAYLAR) capsule; Take 1 capsule (1.5 mg total) by mouth daily.  Dispense: 30 capsule; Refill: 1 - Discontinue Trazodone due to lack of efficacy and next day grogginess - Start temazepam (RESTORIL) 15 MG capsule; Take 1 capsule (15 mg total) by mouth at bedtime as needed for sleep.  Dispense: 30 capsule; Refill: 1  2. MDD (major depressive disorder), recurrent, severe, with psychosis (HCC)  - sertraline (ZOLOFT) 50 MG tablet; Take 1 tablet (50 mg total) by mouth daily.  Dispense: 30 tablet; Refill: 1 - cariprazine (VRAYLAR) capsule; Take 1 capsule (1.5 mg total) by mouth daily.  Dispense: 30 capsule; Refill: 1  3. Other mixed anxiety disorders  - Start temazepam (RESTORIL) 15 MG  capsule; Take 1 capsule (15 mg total) by mouth at bedtime as needed for sleep.  Dispense: 30 capsule; Refill: 1   Follow-up in 6 weeks with Dr. Vanetta Shawl. Pt was advised to contact the clinic if symptoms worsen or fail to improve over the next few weeks.   Zena Amos, MD 03/01/2019, 9:41 AM

## 2019-03-26 ENCOUNTER — Ambulatory Visit: Admitting: Obstetrics & Gynecology

## 2019-04-09 ENCOUNTER — Other Ambulatory Visit: Payer: Self-pay

## 2019-04-09 ENCOUNTER — Encounter (HOSPITAL_COMMUNITY): Payer: Self-pay | Admitting: Psychiatry

## 2019-04-09 ENCOUNTER — Ambulatory Visit (INDEPENDENT_AMBULATORY_CARE_PROVIDER_SITE_OTHER): Payer: 59 | Admitting: Psychiatry

## 2019-04-09 DIAGNOSIS — F331 Major depressive disorder, recurrent, moderate: Secondary | ICD-10-CM | POA: Diagnosis not present

## 2019-04-09 DIAGNOSIS — F200 Paranoid schizophrenia: Secondary | ICD-10-CM | POA: Diagnosis not present

## 2019-04-09 MED ORDER — ESCITALOPRAM OXALATE 10 MG PO TABS: ORAL_TABLET | ORAL | 1 refills | Status: DC

## 2019-04-09 MED ORDER — CARIPRAZINE HCL 1.5 MG PO CAPS: 1.5000 mg | ORAL_CAPSULE | Freq: Every day | ORAL | 0 refills | Status: DC
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
## 2019-04-09 NOTE — Patient Instructions (Signed)
1. Decrease sertraeline 25 mg daily for one week, then discontinue 2. Start lexapro 5 mg daily for one week, then 10 mg daily  3. Continue Vraylar 1.5 mg daily  4. Continue temazepam 15 mg at night as needed for insomnia 5. Next appointment: 4/6 at 11 AM

## 2019-04-12 ENCOUNTER — Ambulatory Visit (HOSPITAL_COMMUNITY): Admitting: Psychiatry
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
## 2019-05-22 ENCOUNTER — Encounter (HOSPITAL_COMMUNITY): Payer: Self-pay

## 2019-05-22 ENCOUNTER — Ambulatory Visit (HOSPITAL_COMMUNITY): Payer: 59 | Admitting: Psychiatry

## 2019-05-22 ENCOUNTER — Emergency Department (HOSPITAL_COMMUNITY)
Admission: EM | Admit: 2019-05-22 | Discharge: 2019-05-22 | Disposition: A | Discharge status: Home / Self Care | Payer: 59 | Attending: Emergency Medicine | Admitting: Emergency Medicine

## 2019-05-22 ENCOUNTER — Other Ambulatory Visit: Payer: Self-pay

## 2019-05-22 ENCOUNTER — Telehealth (HOSPITAL_COMMUNITY): Payer: Self-pay | Admitting: Psychiatry

## 2019-05-22 ENCOUNTER — Emergency Department (HOSPITAL_COMMUNITY): Payer: 59

## 2019-05-22 DIAGNOSIS — Z91040 Latex allergy status: Secondary | ICD-10-CM | POA: Diagnosis not present

## 2019-05-22 DIAGNOSIS — Z79899 Other long term (current) drug therapy: Secondary | ICD-10-CM | POA: Insufficient documentation

## 2019-05-22 DIAGNOSIS — Z20822 Contact with and (suspected) exposure to covid-19: Secondary | ICD-10-CM | POA: Diagnosis not present

## 2019-05-22 DIAGNOSIS — R1084 Generalized abdominal pain: Secondary | ICD-10-CM | POA: Diagnosis not present

## 2019-05-22 HISTORY — DX: Major depressive disorder, single episode, unspecified: F32.9

## 2019-05-22 HISTORY — DX: Schizophrenia, unspecified: F20.9

## 2019-05-22 LAB — CBC WITH DIFFERENTIAL/PLATELET
Abs Immature Granulocytes: 0.01 10*3/uL (ref 0.00–0.07)
Basophils Absolute: 0 10*3/uL (ref 0.0–0.1)
Basophils Relative: 1 %
Eosinophils Absolute: 0.1 10*3/uL (ref 0.0–0.5)
Eosinophils Relative: 1 %
HCT: 42.5 % (ref 36.0–46.0)
Hemoglobin: 13.9 g/dL (ref 12.0–15.0)
Immature Granulocytes: 0 %
Lymphocytes Relative: 15 %
Lymphs Abs: 1.2 10*3/uL (ref 0.7–4.0)
MCH: 29.8 pg (ref 26.0–34.0)
MCHC: 32.7 g/dL (ref 30.0–36.0)
MCV: 91.2 fL (ref 80.0–100.0)
Monocytes Absolute: 0.6 10*3/uL (ref 0.1–1.0)
Monocytes Relative: 8 %
Neutro Abs: 6 10*3/uL (ref 1.7–7.7)
Neutrophils Relative %: 75 %
Platelets: 199 10*3/uL (ref 150–400)
RBC: 4.66 MIL/uL (ref 3.87–5.11)
RDW: 13.5 % (ref 11.5–15.5)
WBC: 8 10*3/uL (ref 4.0–10.5)
nRBC: 0 % (ref 0.0–0.2)

## 2019-05-22 LAB — COMPREHENSIVE METABOLIC PANEL
ALT: 15 U/L (ref 0–44)
AST: 17 U/L (ref 15–41)
Albumin: 5 g/dL (ref 3.5–5.0)
Alkaline Phosphatase: 67 U/L (ref 38–126)
Anion gap: 10 (ref 5–15)
BUN: 10 mg/dL (ref 6–20)
CO2: 24 mmol/L (ref 22–32)
Calcium: 9.4 mg/dL (ref 8.9–10.3)
Chloride: 104 mmol/L (ref 98–111)
Creatinine, Ser: 0.71 mg/dL (ref 0.44–1.00)
GFR calc Af Amer: >60 mL/min (ref >60)
GFR calc non Af Amer: >60 mL/min (ref >60)
Glucose, Bld: 94 mg/dL (ref 70–99)
Potassium: 3.1 mmol/L — ABNORMAL LOW (ref 3.5–5.1)
Sodium: 138 mmol/L (ref 135–145)
Total Bilirubin: 0.5 mg/dL (ref 0.3–1.2)
Total Protein: 8.3 g/dL — ABNORMAL HIGH (ref 6.5–8.1)

## 2019-05-22 LAB — URINALYSIS, ROUTINE W REFLEX MICROSCOPIC
Bilirubin Urine: NEGATIVE
Glucose, UA: NEGATIVE mg/dL
Hgb urine dipstick: NEGATIVE
Ketones, ur: NEGATIVE mg/dL
Leukocytes,Ua: NEGATIVE
Nitrite: NEGATIVE
Protein, ur: NEGATIVE mg/dL
Specific Gravity, Urine: 1.027 (ref 1.005–1.030)
pH: 6 (ref 5.0–8.0)

## 2019-05-22 LAB — POC SARS CORONAVIRUS 2 AG -  ED: SARS Coronavirus 2 Ag: NEGATIVE

## 2019-05-22 LAB — LIPASE, BLOOD: Lipase: 23 U/L (ref 11–51)

## 2019-05-22 MED ORDER — SUCRALFATE 1 G PO TABS: 1.0000 g | ORAL_TABLET | Freq: Three times a day (TID) | ORAL | 0 refills | Status: DC

## 2019-05-22 MED ORDER — ONDANSETRON 4 MG PO TBDP: 4.0000 mg | ORAL_TABLET | Freq: Three times a day (TID) | ORAL | 0 refills | Status: DC | PRN

## 2019-05-22 MED ORDER — ALUM & MAG HYDROXIDE-SIMETH 200-200-20 MG/5ML PO SUSP
30.0000 mL | Freq: Once | ORAL | Status: AC
  Administered 2019-05-22: 30 mL via ORAL
  Filled 2019-05-22: qty 30

## 2019-05-22 MED ORDER — ONDANSETRON HCL 4 MG/2ML IJ SOLN
4.0000 mg | Freq: Once | INTRAMUSCULAR | Status: AC
  Administered 2019-05-22: 19:00:00 4 mg via INTRAVENOUS
  Filled 2019-05-22: qty 2

## 2019-05-22 MED ORDER — SODIUM CHLORIDE 0.9 % IV BOLUS
1000.0000 mL | Freq: Once | INTRAVENOUS | Status: AC
  Administered 2019-05-22: 1000 mL via INTRAVENOUS

## 2019-05-22 MED ORDER — SODIUM CHLORIDE 0.9 % IV BOLUS
1000.0000 mL | Freq: Once | INTRAVENOUS | Status: AC
  Administered 2019-05-22: 19:00:00 1000 mL via INTRAVENOUS

## 2019-05-22 MED ORDER — IOHEXOL 300 MG/ML  SOLN
100.0000 mL | Freq: Once | INTRAMUSCULAR | Status: AC | PRN
  Administered 2019-05-22: 100 mL via INTRAVENOUS

## 2019-05-22 MED ORDER — POTASSIUM CHLORIDE CRYS ER 20 MEQ PO TBCR
40.0000 meq | EXTENDED_RELEASE_TABLET | Freq: Once | ORAL | Status: AC
  Administered 2019-05-22: 22:00:00 40 meq via ORAL
  Filled 2019-05-22: qty 2

## 2019-05-22 NOTE — ED Notes (Signed)
Pt in bed, pt denies dizziness during orthostatic vitals, pt denies nausea or vomiting, pt continues to report 5/10 abd pain.

## 2019-05-22 NOTE — ED Triage Notes (Signed)
Pt presents to ED with complaints of mid abdominal pain, weakness, nausea and dizziness x 1 week. Pt states she has no appetite and unable to eat. Pt states when she does eat she can keep it down without vomiting but just feels nauseated.

## 2019-05-22 NOTE — Discharge Instructions (Addendum)
Today your potassium was low.  Please try to eat a banana once or twice a day for at least the next week.  This is bland and should be tolerated well with an upset stomach in addition to being high in potassium.  Please start taking prilosec (also known as omeprazole.)  This is available over-the-counter.  Please take it first thing in the morning before you eat

## 2019-05-22 NOTE — ED Notes (Signed)
Pt in bed, pt denies any pain relief, water given for po challenge.

## 2019-05-22 NOTE — ED Notes (Signed)
Unable to complete orthostatic vital signs, pt states that she is dizzy and can't stand for the three minute standing

## 2019-05-22 NOTE — Telephone Encounter (Signed)
Sent link for video visit through D[facility]. Patient did not sign in. Called the patient  twice for appointment scheduled today. The patient did not answer the phone. Left voice message to contact the office.  

## 2019-05-22 NOTE — ED Provider Notes (Signed)
[facility] EMERGENCY DEPARTMENT Provider Note   CSN: [id number] Arrival date & time: 05/22/19  1553     History Chief Complaint  Patient presents with  . Abdominal Pain    Jessica Poole is a 24 y.o. female with a past medical history of anxiety, depression, schizophrenia, status post hysterectomy, who presents today for evaluation of poor appetite, nausea, and epigastric pain. She reports that approximately 1 week ago she started feeling poorly with minimal appetite, feeling nauseous anytime she eats or drinks.  She states that that progressed to about 2 days ago when she developed epigastric abdominal pain.  She reports she had one brief episode of diarrhea however that was a few days ago.  Her pain is made worse with eating.  Made better with not eating.  Her pain does not radiate or move. She has not had similar pain before. She states that now when she changes position she feels dizzy.  This is clarified as feeling like she is going to pass out, not feeling like things are moving. She denies any fevers.  She denies dysuria, increased frequency or urgency.  She states she has not had any known new medications and the week before her symptoms started.   HPI     Past Medical History:  Diagnosis Date  . Anxiety   . Arthritis   . Depression   . Major depressive disorder   . Schizophrenia [facility])     Patient Active Problem List   Diagnosis Date Noted  . MDD (major depressive disorder), recurrent, severe, with psychosis ([place]) 03/01/2019  . Schizophrenia, paranoid ([place])- needs to be excluded 01/30/2019  . MDD (major depressive disorder), recurrent episode, moderate ([place]) 07/30/2018  . Other mixed anxiety disorders 07/30/2018  . Insomnia 07/30/2018  . S/P vaginal hysterectomy 05/03/2018  . Preterm premature rupture of membranes (PPROM) with unknown onset of labor 11/03/2017  . Gestational hypertension 11/02/2017  . Polyhydramnios 10/13/2017  . 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 Previous cesarean section complicating pregnancy 79/03/4095  . Chronic pelvic pain in female 10/27/2016    Past Surgical History:  Procedure Laterality Date  . ABDOMINAL HYSTERECTOMY    . CESAREAN SECTION    . HERNIA REPAIR Right    24 yrs old-inguinal  . LAPAROSCOPIC LYSIS OF ADHESIONS  12/21/2016   Procedure: LAPAROSCOPIC LYSIS OF ADHESIONS;  Surgeon: Jonnie Kind, MD;  Location: [facility];  Service: Gynecology;;  . LAPAROSCOPIC SALPINGO OOPHERECTOMY Left 12/21/2016   Procedure: LAPAROSCOPIC LEFT SALPINGO OOPHORECTOMY;  Surgeon: Jonnie Kind, MD;  Location: [facility];  Service: Gynecology;  Laterality: Left;  . LAPAROSCOPIC TUBAL LIGATION Bilateral 01/04/2018   Procedure: LAPAROSCOPIC RIGHT TUBAL LIGATION USING ELECTROCAUTERY;  Surgeon: Florian Buff, MD;  Location: [facility];  Service: Gynecology;  Laterality: Bilateral;  . LAPAROSCOPY  12/21/2016   Procedure: LAPAROSCOPY DIAGNOSTIC;  Surgeon: Jonnie Kind, MD;  Location: [facility];  Service: Gynecology;;  . TUBAL LIGATION    . VAGINAL HYSTERECTOMY N/A 05/03/2018   Procedure: HYSTERECTOMY VAGINAL;  Surgeon: Florian Buff, MD;  Location: [facility];  Service: Gynecology;  Laterality: N/A;     OB History    Gravida  2   Para  2   Term  1   Preterm  1   AB  0   Living  2     SAB      TAB      Ectopic      Multiple  0   Live Births  2           Family History  Problem Relation Age of Onset  . Drug abuse Sister   . Hernia Son   . Heart disease Paternal Grandfather   . Alzheimer's disease Paternal Grandfather   . Depression Maternal Grandmother   . Bipolar disorder Maternal Grandmother   . Drug abuse Father   . Hypertension Father   . Depression Father   . Alcohol abuse Mother   . Hypertension Paternal Aunt   . Schizophrenia Cousin     Social History   Tobacco Use  . Smoking status: Never Smoker  . Smokeless tobacco: Never Used  Substance Use Topics  .  Alcohol use: Yes    Comment: occ  . Drug use: Not Currently    Comment: pt tried marijuana recently    Home Medications Prior to Admission medications   Medication Sig Start Date End Date Taking? Authorizing Provider  cariprazine (VRAYLAR) capsule Take 1 capsule (1.5 mg total) by mouth daily. Patient not taking: Reported on 05/22/2019 04/26/19   Neysa Hotter, MD  conjugated estrogens (PREMARIN) vaginal cream Place 1 Applicatorful vaginally daily. Patient not taking: Reported on 05/22/2019 08/15/18   Lazaro Arms, MD  escitalopram (LEXAPRO) 10 MG tablet 5 mg daily for one week, then 10 mg daily Patient not taking: Reported on 05/22/2019 04/09/19   Neysa Hotter, MD  ondansetron (ZOFRAN ODT) 4 MG disintegrating tablet Take 1 tablet (4 mg total) by mouth every 8 (eight) hours as needed for nausea or vomiting. 05/22/19   Cristina Gong, PA-C  pentosan polysulfate (ELMIRON) 100 MG capsule Take 2 tablets twice daily Patient not taking: Reported on 05/22/2019 10/02/18   Lazaro Arms, MD  sucralfate (CARAFATE) 1 g tablet Take 1 tablet (1 g total) by mouth 4 (four) times daily -  with meals and at bedtime for 14 days. 05/22/19 06/05/19  Cristina Gong, PA-C  temazepam (RESTORIL) 15 MG capsule Take 1 capsule (15 mg total) by mouth at bedtime as needed for sleep. Patient not taking: Reported on 05/22/2019 03/01/19   Zena Amos, MD    Allergies    Latex  Review of Systems   Review of Systems  Constitutional: Positive for appetite change and fatigue. Negative for chills and fever.  HENT: Negative for congestion.   Respiratory: Negative for cough, chest tightness and shortness of breath.   Cardiovascular: Negative for chest pain.  Gastrointestinal: Positive for abdominal pain, diarrhea and nausea. Negative for constipation.  Genitourinary: Negative for difficulty urinating, dysuria, flank pain, frequency, urgency, vaginal bleeding, vaginal discharge and vaginal pain.  Musculoskeletal: Negative  for back pain and neck pain.  Skin: Negative for color change.  Neurological: Positive for light-headedness. Negative for weakness and headaches.  All other systems reviewed and are negative.   Physical Exam Updated Vital Signs BP 110/88 (BP Location: Left Arm)   Pulse 78   Temp (!) 97.3 F (36.3 C) (Oral)   Resp 17   Ht 5' (1.524 m)   Wt 59 kg   LMP 03/27/2018 (Exact Date)   SpO2 100%   BMI 25.39 kg/m   Physical Exam Vitals and nursing note reviewed.  Constitutional:      General: She is not in acute distress.    Appearance: She is well-developed.  HENT:     Head: Normocephalic and atraumatic.  Eyes:     Conjunctiva/sclera: Conjunctivae normal.  Cardiovascular:     Rate and Rhythm:  Normal rate and regular rhythm.     Heart sounds: No murmur.  Pulmonary:     Effort: Pulmonary effort is normal. No respiratory distress.     Breath sounds: Normal breath sounds.  Abdominal:     General: Abdomen is flat. Bowel sounds are normal. There is no distension.     Palpations: Abdomen is soft.     Tenderness: There is abdominal tenderness in the right upper quadrant and epigastric area.  Musculoskeletal:     Cervical back: Neck supple.  Skin:    General: Skin is warm and dry.  Neurological:     General: No focal deficit present.     Mental Status: She is alert.     Cranial Nerves: No cranial nerve deficit.  Psychiatric:        Attention and Perception: Attention normal.        Speech: Speech normal.        Behavior: Behavior is cooperative.     ED Results / Procedures / Treatments   Labs (all labs ordered are listed, but only abnormal results are displayed) Labs Reviewed  COMPREHENSIVE METABOLIC PANEL - Abnormal; Notable for the following components:      Result Value   Potassium 3.1 (*)    Total Protein 8.3 (*)    All other components within normal limits  URINALYSIS, ROUTINE W REFLEX MICROSCOPIC - Abnormal; Notable for the following components:   APPearance HAZY  (*)    All other components within normal limits  SARS CORONAVIRUS 2 (TAT 6-24 HRS)  LIPASE, BLOOD  CBC WITH DIFFERENTIAL/PLATELET  POC SARS CORONAVIRUS 2 AG -  ED    EKG None  Radiology CT Abdomen Pelvis W Contrast  Result Date: 05/22/2019 CLINICAL DATA:  Nausea, vomiting, abdominal pain EXAM: CT ABDOMEN AND PELVIS WITH CONTRAST TECHNIQUE: Multidetector CT imaging of the abdomen and pelvis was performed using the standard protocol following bolus administration of intravenous contrast. CONTRAST:  OMNIPAQUE IOHEXOL 300 MG/ML  SOLN COMPARISON:  None. FINDINGS: Lower chest: Lung bases are clear. No effusions. Heart is normal size. Hepatobiliary: Diffuse low-density throughout the liver, likely mild fatty infiltration. No focal abnormality. Gallbladder unremarkable. Pancreas: No focal abnormality or ductal dilatation. Spleen: No focal abnormality.  Normal size. Adrenals/Urinary Tract: No adrenal abnormality. No focal renal abnormality. No stones or hydronephrosis. Urinary bladder is unremarkable. Stomach/Bowel: Normal appendix. Stomach, large and small bowel grossly unremarkable. Vascular/Lymphatic: No evidence of aneurysm or adenopathy. Reproductive: Prior hysterectomy.  No adnexal masses. Other: No free fluid or free air. Musculoskeletal: No acute bony abnormality. IMPRESSION: Normal appendix. Mild diffuse low-density throughout the liver suggests early fatty infiltration. No acute findings in the abdomen or pelvis. Electronically Signed   By: Charlett Nose M.D.   On: 05/22/2019 18:22    Procedures Procedures (including critical care time)  Medications Ordered in ED Medications  sodium chloride 0.9 % bolus 1,000 mL (0 mLs Intravenous Stopped 05/22/19 1914)  sodium chloride 0.9 % bolus 1,000 mL (0 mLs Intravenous Stopped 05/22/19 2111)  iohexol (OMNIPAQUE) 300 MG/ML solution 100 mL (100 mLs Intravenous Contrast Given 05/22/19 1737)  alum & mag hydroxide-simeth (MAALOX/MYLANTA) 200-200-20 MG/5ML  suspension 30 mL (30 mLs Oral Given 05/22/19 1913)  ondansetron (ZOFRAN) injection 4 mg (4 mg Intravenous Given 05/22/19 1908)  potassium chloride SA (KLOR-CON) CR tablet 40 mEq (40 mEq Oral Given 05/22/19 2148)    ED Course  I have reviewed the triage vital signs and the nursing notes.  Pertinent labs & imaging results  that were available during my care of the patient were reviewed by me and considered in my medical decision making (see chart for details).  Clinical Course as of May 21 2329  Tue May 22, 2019  2132 Patient reevaluated, she has tolerated water well.   [EH]    Clinical Course User Index [EH] Cristina Gong, PA-C   Orthostatic VS for the past 24 hrs:  BP- Lying Pulse- Lying BP- Sitting Pulse- Sitting BP- Standing at 0 minutes Pulse- Standing at 0 minutes  05/22/19 2112 96/67 63 102/78 73 100/89 63  05/22/19 1647 105/79 73 94/73 94 98/79 113      MDM Rules/Calculators/A&P                     NISHA DHAMI is a 24 year old woman who presents today for evaluation of multiple symptoms including decreased appetite, nausea with eating, epigastric abdominal pain.  Labs are obtained and reviewed, she has mild hypokalemia with a potassium of 3.1 however CBC, CMP without other significant acute immunohistologic or electrolyte derangements.  Lipase is not elevated.  Urine is without evidence of infection.  CT scan of abdomen and pelvis was obtained without acute abnormality.  Initially patient was very orthostatic and symptomatic.  She was treated with 2 L of IV fluids after which her orthostasis resolved. She was able to p.o. challenge in the emergency room without difficulty.  She is given prescriptions for Carafate and Zofran.  Recommended to Prilosec in addition. Suspect gastritis.  Return precautions were discussed with patient who states their understanding.  At the time of discharge patient denied any unaddressed complaints or concerns.  Patient is agreeable for  discharge home.  Note: Portions of this report may have been transcribed using voice recognition software. Every effort was made to ensure accuracy; however, inadvertent computerized transcription errors may be present  Final Clinical Impression(s) / ED Diagnoses Final diagnoses:  Generalized abdominal pain    Rx / DC Orders ED Discharge Orders         Ordered    sucralfate (CARAFATE) 1 g tablet  3 times daily with meals & bedtime     05/22/19 2134    ondansetron (ZOFRAN ODT) 4 MG disintegrating tablet  Every 8 hours PRN,   Status:  Discontinued     05/22/19 2134    ondansetron (ZOFRAN ODT) 4 MG disintegrating tablet  Every 8 hours PRN     05/22/19 2135           Cristina Gong, PA-C 05/22/19 2332    Bethann Berkshire, MD 05/23/19 1148

## 2019-05-23 LAB — SARS CORONAVIRUS 2 (TAT 6-24 HRS): SARS Coronavirus 2: NEGATIVE

## 2019-05-25 ENCOUNTER — Other Ambulatory Visit: Payer: Self-pay

## 2019-05-25 ENCOUNTER — Other Ambulatory Visit (INDEPENDENT_AMBULATORY_CARE_PROVIDER_SITE_OTHER): Payer: 59 | Admitting: *Deleted

## 2019-05-25 ENCOUNTER — Other Ambulatory Visit (HOSPITAL_COMMUNITY)
Admission: RE | Admit: 2019-05-25 | Discharge: 2019-05-25 | Disposition: A | Discharge status: Home / Self Care | Payer: 59 | Source: Ambulatory Visit | Attending: Obstetrics & Gynecology | Admitting: Obstetrics & Gynecology

## 2019-05-25 DIAGNOSIS — Z202 Contact with and (suspected) exposure to infections with a predominantly sexual mode of transmission: Secondary | ICD-10-CM | POA: Diagnosis not present

## 2019-05-25 DIAGNOSIS — Z113 Encounter for screening for infections with a predominantly sexual mode of transmission: Secondary | ICD-10-CM | POA: Diagnosis not present

## 2019-05-25 NOTE — Progress Notes (Signed)
   NURSE VISIT- STD  SUBJECTIVE:  Jessica Poole is a 24 y.o. [id number] GYN patientfemale here for a vaginal swab for STD screen.  She reports the following symptoms: none for 0 days. Denies abnormal vaginal bleeding, significant pelvic pain, fever, or UTI symptoms.  OBJECTIVE:  LMP 03/27/2018 (Exact Date)   Appears well, in no apparent distress  ASSESSMENT: Vaginal swab for STD screen  PLAN: Self-collected vaginal probe for Gonorrhea, Chlamydia, Trichomonas sent to lab Treatment: to be determined once results are received Follow-up as needed if symptoms persist/worsen, or new symptoms develop  Malachy Mood  05/25/2019 12:38 PM

## 2019-05-25 NOTE — Progress Notes (Signed)
Chart reviewed for nurse visit. Agree with plan of care.  Adline Potter, NP 05/25/2019 12:49 PM

## 2019-05-28 LAB — CERVICOVAGINAL ANCILLARY ONLY
Chlamydia: NEGATIVE
Comment: NEGATIVE
Comment: NEGATIVE
Comment: NORMAL
Neisseria Gonorrhea: NEGATIVE
Trichomonas: NEGATIVE

## 2019-05-30 ENCOUNTER — Other Ambulatory Visit: Payer: Self-pay

## 2019-05-30 ENCOUNTER — Encounter: Payer: Self-pay | Admitting: Adult Health

## 2019-05-30 ENCOUNTER — Ambulatory Visit (INDEPENDENT_AMBULATORY_CARE_PROVIDER_SITE_OTHER): Payer: 59 | Admitting: Adult Health

## 2019-05-30 VITALS — BP 100/74 | HR 96 | Ht 60.0 in | Wt 119.0 lb

## 2019-05-30 DIAGNOSIS — N898 Other specified noninflammatory disorders of vagina: Secondary | ICD-10-CM | POA: Insufficient documentation

## 2019-05-30 DIAGNOSIS — T192XXA Foreign body in vulva and vagina, initial encounter: Secondary | ICD-10-CM | POA: Diagnosis not present

## 2019-05-30 NOTE — Progress Notes (Signed)
  Subjective:     Patient ID: Jessica Poole, female   DOB: 08-11-1995, 25 y.o.   MRN: [id number]  HPI Jessica Poole is a 24 year old white female,divorced, sp hysterectomy for prolapse in complaining of vaginal discharge and itch and condom broke, had negative trich, GC/CHL last week, 05/25/19.  Review of Systems Vaginal discharge and itching Had condom break Reviewed past medical,surgical, social and family history. Reviewed medications and allergies.     Objective:   Physical Exam BP 100/74 (BP Location: Left Arm, Patient Position: Sitting, Cuff Size: Normal)   Pulse 96   Ht 5' (1.524 m)   Wt 119 lb (54 kg)   LMP 03/27/2018 (Exact Date)   BMI 23.24 kg/m   Skin warm and dry.Pelvic: external genitalia is normal in appearance no lesions, vagina: scant discharge without odor,piece o condom in vagina, removed with forceps,urethra has no lesions or masses noted, cervix and uterus are absent, adnexa: no masses or tenderness noted. Bladder is non tender and no masses felt. Nuswab obtained. Examination chaperoned by Stoney Bang LPN    Assessment:     1. Vaginal discharge Nuswab sent  2. Vaginal itching Nuswab sent   3. Retained foreign body of vagina, initial encounter Condom piece removed with forceps    Plan:     Continue condom use Nuswab sent,will call her with results, can not access mychart she says  Follow up prn

## 2019-06-02 LAB — NUSWAB VAGINITIS PLUS (VG+)
Atopobium vaginae: HIGH Score — AB
BVAB 2: HIGH Score — AB
Candida albicans, NAA: NEGATIVE
Candida glabrata, NAA: NEGATIVE
Chlamydia trachomatis, NAA: NEGATIVE
Megasphaera 1: HIGH Score — AB
Neisseria gonorrhoeae, NAA: NEGATIVE
Trich vag by NAA: NEGATIVE

## 2019-06-04 ENCOUNTER — Other Ambulatory Visit: Payer: Self-pay | Admitting: Adult Health

## 2019-06-04 MED ORDER — METRONIDAZOLE 500 MG PO TABS: 500.0000 mg | ORAL_TABLET | Freq: Two times a day (BID) | ORAL | 0 refills | Status: DC

## 2019-06-04 NOTE — Progress Notes (Signed)
rx fllagyl

## 2019-11-30 ENCOUNTER — Ambulatory Visit: Payer: 59 | Admitting: Obstetrics & Gynecology

## 2019-12-17 ENCOUNTER — Other Ambulatory Visit: Payer: Self-pay | Admitting: Obstetrics & Gynecology

## 2019-12-17 NOTE — Patient Instructions (Signed)
Jessica Poole  12/17/2019     @PREFPERIOPPHARMACY @   Your procedure is scheduled on  12/19/2019.  [facility] at  0830  A.M.  Call this number if you have problems the morning of surgery:  [phone]   Remember:  Drink your carb drink at 0700 and then nothing else to drink. No solid foods after midnight 12/18/2019.                        Take these medicines the morning of surgery with A SIP OF WATER None    Do not wear jewelry, make-up or nail polish.  Do not wear lotions, powders, or perfumes. Please wear deodorant and brush your teeth.  Do not shave 48 hours prior to surgery.  Men may shave face and neck.  Do not bring valuables to the hospital.  [facility] is not responsible for any belongings or valuables.  Contacts, dentures or bridgework may not be worn into surgery.  Leave your suitcase in the car.  After surgery it may be brought to your room.  For patients admitted to the hospital, discharge time will be determined by your treatment team.  Patients discharged the day of surgery will not be allowed to drive home.   Name and phone number of your driver:   family Special instructions:   DO NOT smoke the morning of your procedure.  Please read over the following fact sheets that you were given. Anesthesia Post-op Instructions and Care and Recovery After Surgery       Unilateral Salpingo-Oophorectomy, Care After This sheet gives you information about how to care for yourself after your procedure. Your health care provider may also give you more specific instructions. If you have problems or questions, contact your health care provider. What can I expect after the procedure? After the procedure, it is common to have:  Abdominal pain.  Some occasional vaginal bleeding (spotting).  Tiredness. Follow these instructions at home: Incision care   Keep your incision area and your bandage (dressing) clean and dry.  Follow instructions  from your health care provider about how to take care of your incision. Make sure you: ? Wash your hands with soap and water before you change your dressing. If soap and water are not available, use hand sanitizer. ? Change your dressing as told by your health care provider. ? Leave stitches (sutures), staples, skin glue, or adhesive strips in place. These skin closures may need to stay in place for 2 weeks or longer. If adhesive strip edges start to loosen and curl up, you may trim the loose edges. Do not remove adhesive strips completely unless your health care provider tells you to do that.  Check your incision area every day for signs of infection. Check for: ? Redness, swelling, or pain. ? Fluid or blood. ? Warmth. ? Pus or a bad smell. Activity  Do not drive or use heavy machinery while taking prescription pain medicine.  Do not drive for 24 hours if you received a medicine to help you relax (sedative).  Take frequent, short walks throughout the day. Rest when you get tired. Ask your health care provider what activities are safe for you.  Avoid activities that require great effort. Also, avoid heavy lifting. Do not lift anything that is heavier than 5 lb (2.3 kg), or the limit that your health care provider tells you, until he or she says  that it is safe to do so.  Do not douche, use tampons, or have sex until your health care provider approves. General instructions  To prevent or treat constipation while you are taking prescription pain medicine, your health care provider may recommend that you: ? Drink enough fluid to keep your urine pale yellow. ? Take over-the-counter or prescription medicines. ? Eat foods that are high in fiber, such as fresh fruits and vegetables, whole grains, and beans. ? Limit foods that are high in fat and processed sugars, such as fried and sweet foods.  Take over-the-counter and prescription medicines only as told by your health care provider.  Do not  take baths, swim, or use a hot tub until your health care provider approves. Ask your health care provider if you may take showers. You may only be allowed to take sponge baths.  Wear compression stockings as told by your health care provider. These stockings help to prevent blood clots and reduce swelling in your legs.  Keep all follow-up visits as told by your health care provider. This is important. Contact a health care provider if:  You have pain when you urinate.  You have pus or a bad smelling discharge coming from your vagina.  You have redness, swelling, or pain around your incision.  You have fluid or blood coming from your incision.  Your incision feels warm to the touch.  You have pus or a bad smell coming from your incision.  You have a fever.  Your incision starts to break open.  You have abdominal pain that gets worse or does not get better with medicine.  You develop a rash.  You develop nausea and vomiting.  You feel lightheaded. Get help right away if:  You develop pain in your chest or leg.  You develop shortness of breath.  You faint.  You have increased bleeding from your vagina. Summary  After the procedure, it is common to have pain, tiredness, and occasional bleeding from the vagina.  Follow instructions from your health care provider about how to take care of your incision.  Check your incision every day for signs of infection and report any symptoms to your health care provider.  Follow instructions from your health care provider about activities and restrictions. This information is not intended to replace advice given to you by your health care provider. Make sure you discuss any questions you have with your health care provider. Document Revised: 01/14/2017 Document Reviewed: 05/13/2016 Elsevier Patient Education  2020 [employer]  General Anesthesia, Adult, Care After This sheet gives you information about how to care for yourself  after your procedure. Your health care provider may also give you more specific instructions. If you have problems or questions, contact your health care provider. What can I expect after the procedure? After the procedure, the following side effects are common:  Pain or discomfort at the IV site.  Nausea.  Vomiting.  Sore throat.  Trouble concentrating.  Feeling cold or chills.  Weak or tired.  Sleepiness and fatigue.  Soreness and body aches. These side effects can affect parts of the body that were not involved in surgery. Follow these instructions at home:  For at least 24 hours after the procedure:  Have a responsible adult stay with you. It is important to have someone help care for you until you are awake and alert.  Rest as needed.  Do not: ? Participate in activities in which you could fall or become injured. ?  Drive. ? Use heavy machinery. ? Drink alcohol. ? Take sleeping pills or medicines that cause drowsiness. ? Make important decisions or sign legal documents. ? Take care of children on your own. Eating and drinking  Follow any instructions from your health care provider about eating or drinking restrictions.  When you feel hungry, start by eating small amounts of foods that are soft and easy to digest (bland), such as toast. Gradually return to your regular diet.  Drink enough fluid to keep your urine pale yellow.  If you vomit, rehydrate by drinking water, juice, or clear broth. General instructions  If you have sleep apnea, surgery and certain medicines can increase your risk for breathing problems. Follow instructions from your health care provider about wearing your sleep device: ? Anytime you are sleeping, including during daytime naps. ? While taking prescription pain medicines, sleeping medicines, or medicines that make you drowsy.  Return to your normal activities as told by your health care provider. Ask your health care provider what  activities are safe for you.  Take over-the-counter and prescription medicines only as told by your health care provider.  If you smoke, do not smoke without supervision.  Keep all follow-up visits as told by your health care provider. This is important. Contact a health care provider if:  You have nausea or vomiting that does not get better with medicine.  You cannot eat or drink without vomiting.  You have pain that does not get better with medicine.  You are unable to pass urine.  You develop a skin rash.  You have a fever.  You have redness around your IV site that gets worse. Get help right away if:  You have difficulty breathing.  You have chest pain.  You have blood in your urine or stool, or you vomit blood. Summary  After the procedure, it is common to have a sore throat or nausea. It is also common to feel tired.  Have a responsible adult stay with you for the first 24 hours after general anesthesia. It is important to have someone help care for you until you are awake and alert.  When you feel hungry, start by eating small amounts of foods that are soft and easy to digest (bland), such as toast. Gradually return to your regular diet.  Drink enough fluid to keep your urine pale yellow.  Return to your normal activities as told by your health care provider. Ask your health care provider what activities are safe for you. This information is not intended to replace advice given to you by your health care provider. Make sure you discuss any questions you have with your health care provider. Document Revised: 02/04/2017 Document Reviewed: 09/17/2016 Elsevier Patient Education  2020 [employer]. How to Use Chlorhexidine for Bathing Chlorhexidine gluconate (CHG) is a germ-killing (antiseptic) solution that is used to clean the skin. It can get rid of the bacteria that normally live on the skin and can keep them away for about 24 hours. To clean your skin with CHG, you  may be given:  A CHG solution to use in the shower or as part of a sponge bath.  A prepackaged cloth that contains CHG. Cleaning your skin with CHG may help lower the risk for infection:  While you are staying in the intensive care unit of the hospital.  If you have a vascular access, such as a central line, to provide short-term or long-term access to your veins.  If you have a  catheter to drain urine from your bladder.  If you are on a ventilator. A ventilator is a machine that helps you breathe by moving air in and out of your lungs.  After surgery. What are the risks? Risks of using CHG include:  A skin reaction.  Hearing loss, if CHG gets in your ears.  Eye injury, if CHG gets in your eyes and is not rinsed out.  The CHG product catching fire. Make sure that you avoid smoking and flames after applying CHG to your skin. Do not use CHG:  If you have a chlorhexidine allergy or have previously reacted to chlorhexidine.  On babies younger than 79 months of age. How to use CHG solution  Use CHG only as told by your health care provider, and follow the instructions on the label.  Use the full amount of CHG as directed. Usually, this is one bottle. During a shower Follow these steps when using CHG solution during a shower (unless your health care provider gives you different instructions): 1. Start the shower. 2. Use your normal soap and shampoo to wash your face and hair. 3. Turn off the shower or move out of the shower stream. 4. Pour the CHG onto a clean washcloth. Do not use any type of brush or rough-edged sponge. 5. Starting at your neck, lather your body down to your toes. Make sure you follow these instructions: ? If you will be having surgery, pay special attention to the part of your body where you will be having surgery. Scrub this area for at least 1 minute. ? Do not use CHG on your head or face. If the solution gets into your ears or eyes, rinse them well with  water. ? Avoid your genital area. ? Avoid any areas of skin that have broken skin, cuts, or scrapes. ? Scrub your back and under your arms. Make sure to wash skin folds. 6. Let the lather sit on your skin for 1-2 minutes or as long as told by your health care provider. 7. Thoroughly rinse your entire body in the shower. Make sure that all body creases and crevices are rinsed well. 8. Dry off with a clean towel. Do not put any substances on your body afterward--such as powder, lotion, or perfume--unless you are told to do so by your health care provider. Only use lotions that are recommended by the manufacturer. 9. Put on clean clothes or pajamas. 10. If it is the night before your surgery, sleep in clean sheets.  During a sponge bath Follow these steps when using CHG solution during a sponge bath (unless your health care provider gives you different instructions): 1. Use your normal soap and shampoo to wash your face and hair. 2. Pour the CHG onto a clean washcloth. 3. Starting at your neck, lather your body down to your toes. Make sure you follow these instructions: ? If you will be having surgery, pay special attention to the part of your body where you will be having surgery. Scrub this area for at least 1 minute. ? Do not use CHG on your head or face. If the solution gets into your ears or eyes, rinse them well with water. ? Avoid your genital area. ? Avoid any areas of skin that have broken skin, cuts, or scrapes. ? Scrub your back and under your arms. Make sure to wash skin folds. 4. Let the lather sit on your skin for 1-2 minutes or as long as told by your health care  provider. 5. Using a different clean, wet washcloth, thoroughly rinse your entire body. Make sure that all body creases and crevices are rinsed well. 6. Dry off with a clean towel. Do not put any substances on your body afterward--such as powder, lotion, or perfume--unless you are told to do so by your health care provider.  Only use lotions that are recommended by the manufacturer. 7. Put on clean clothes or pajamas. 8. If it is the night before your surgery, sleep in clean sheets. How to use CHG prepackaged cloths  Only use CHG cloths as told by your health care provider, and follow the instructions on the label.  Use the CHG cloth on clean, dry skin.  Do not use the CHG cloth on your head or face unless your health care provider tells you to.  When washing with the CHG cloth: ? Avoid your genital area. ? Avoid any areas of skin that have broken skin, cuts, or scrapes. Before surgery Follow these steps when using a CHG cloth to clean before surgery (unless your health care provider gives you different instructions): 1. Using the CHG cloth, vigorously scrub the part of your body where you will be having surgery. Scrub using a back-and-forth motion for 3 minutes. The area on your body should be completely wet with CHG when you are done scrubbing. 2. Do not rinse. Discard the cloth and let the area air-dry. Do not put any substances on the area afterward, such as powder, lotion, or perfume. 3. Put on clean clothes or pajamas. 4. If it is the night before your surgery, sleep in clean sheets.  For general bathing Follow these steps when using CHG cloths for general bathing (unless your health care provider gives you different instructions). 1. Use a separate CHG cloth for each area of your body. Make sure you wash between any folds of skin and between your fingers and toes. Wash your body in the following order, switching to a new cloth after each step: ? The front of your neck, shoulders, and chest. ? Both of your arms, under your arms, and your hands. ? Your stomach and groin area, avoiding the genitals. ? Your right leg and foot. ? Your left leg and foot. ? The back of your neck, your back, and your buttocks. 2. Do not rinse. Discard the cloth and let the area air-dry. Do not put any substances on your body  afterward--such as powder, lotion, or perfume--unless you are told to do so by your health care provider. Only use lotions that are recommended by the manufacturer. 3. Put on clean clothes or pajamas. Contact a health care provider if:  Your skin gets irritated after scrubbing.  You have questions about using your solution or cloth. Get help right away if:  Your eyes become very red or swollen.  Your eyes itch badly.  Your skin itches badly and is red or swollen.  Your hearing changes.  You have trouble seeing.  You have swelling or tingling in your mouth or throat.  You have trouble breathing.  You swallow any chlorhexidine. Summary  Chlorhexidine gluconate (CHG) is a germ-killing (antiseptic) solution that is used to clean the skin. Cleaning your skin with CHG may help to lower your risk for infection.  You may be given CHG to use for bathing. It may be in a bottle or in a prepackaged cloth to use on your skin. Carefully follow your health care provider's instructions and the instructions on the product label.  Do not use CHG if you have a chlorhexidine allergy.  Contact your health care provider if your skin gets irritated after scrubbing. This information is not intended to replace advice given to you by your health care provider. Make sure you discuss any questions you have with your health care provider. Document Revised: 04/20/2018 Document Reviewed: 12/30/2016 Elsevier Patient Education  2020 [employer].

## 2019-12-18 ENCOUNTER — Other Ambulatory Visit (HOSPITAL_COMMUNITY)
Admission: RE | Admit: 2019-12-18 | Discharge: 2019-12-18 | Disposition: A | Discharge status: Home / Self Care | Payer: 59 | Source: Ambulatory Visit | Attending: Obstetrics & Gynecology | Admitting: Obstetrics & Gynecology

## 2019-12-18 ENCOUNTER — Other Ambulatory Visit: Payer: Self-pay

## 2019-12-18 ENCOUNTER — Encounter (HOSPITAL_COMMUNITY): Payer: Self-pay

## 2019-12-18 ENCOUNTER — Encounter (HOSPITAL_COMMUNITY)
Admission: RE | Admit: 2019-12-18 | Discharge: 2019-12-18 | Disposition: A | Discharge status: Home / Self Care | Payer: 59 | Source: Ambulatory Visit | Attending: Obstetrics & Gynecology | Admitting: Obstetrics & Gynecology

## 2019-12-18 DIAGNOSIS — Z20822 Contact with and (suspected) exposure to covid-19: Secondary | ICD-10-CM | POA: Insufficient documentation

## 2019-12-18 DIAGNOSIS — Z01812 Encounter for preprocedural laboratory examination: Secondary | ICD-10-CM | POA: Diagnosis not present

## 2019-12-18 LAB — COMPREHENSIVE METABOLIC PANEL
ALT: 15 U/L (ref 0–44)
AST: 20 U/L (ref 15–41)
Albumin: 5.3 g/dL — ABNORMAL HIGH (ref 3.5–5.0)
Alkaline Phosphatase: 69 U/L (ref 38–126)
Anion gap: 8 (ref 5–15)
BUN: 12 mg/dL (ref 6–20)
CO2: 25 mmol/L (ref 22–32)
Calcium: 9.8 mg/dL (ref 8.9–10.3)
Chloride: 103 mmol/L (ref 98–111)
Creatinine, Ser: 0.62 mg/dL (ref 0.44–1.00)
GFR, Estimated: >60 mL/min (ref >60)
Glucose, Bld: 94 mg/dL (ref 70–99)
Potassium: 3.5 mmol/L (ref 3.5–5.1)
Sodium: 136 mmol/L (ref 135–145)
Total Bilirubin: 0.9 mg/dL (ref 0.3–1.2)
Total Protein: 8.5 g/dL — ABNORMAL HIGH (ref 6.5–8.1)

## 2019-12-18 LAB — SARS CORONAVIRUS 2 (TAT 6-24 HRS): SARS Coronavirus 2: NEGATIVE

## 2019-12-18 LAB — URINALYSIS, ROUTINE W REFLEX MICROSCOPIC
Bilirubin Urine: NEGATIVE
Glucose, UA: NEGATIVE mg/dL
Hgb urine dipstick: NEGATIVE
Ketones, ur: NEGATIVE mg/dL
Leukocytes,Ua: NEGATIVE
Nitrite: NEGATIVE
Protein, ur: NEGATIVE mg/dL
Specific Gravity, Urine: 1.01 (ref 1.005–1.030)
pH: 7 (ref 5.0–8.0)

## 2019-12-18 LAB — RAPID HIV SCREEN (HIV 1/2 AB+AG)
HIV 1/2 Antibodies: NONREACTIVE
HIV-1 P24 Antigen - HIV24: NONREACTIVE

## 2019-12-18 LAB — TYPE AND SCREEN
ABO/RH(D): B POS
Antibody Screen: NEGATIVE

## 2019-12-18 LAB — CBC
HCT: 42.4 % (ref 36.0–46.0)
Hemoglobin: 14.5 g/dL (ref 12.0–15.0)
MCH: 32.3 pg (ref 26.0–34.0)
MCHC: 34.2 g/dL (ref 30.0–36.0)
MCV: 94.4 fL (ref 80.0–100.0)
Platelets: 227 10*3/uL (ref 150–400)
RBC: 4.49 MIL/uL (ref 3.87–5.11)
RDW: 11.9 % (ref 11.5–15.5)
WBC: 7.7 10*3/uL (ref 4.0–10.5)
nRBC: 0 % (ref 0.0–0.2)

## 2019-12-19 ENCOUNTER — Ambulatory Visit (HOSPITAL_COMMUNITY): Payer: Self-pay | Admitting: Anesthesiology

## 2019-12-19 ENCOUNTER — Ambulatory Visit (HOSPITAL_COMMUNITY)
Admission: RE | Admit: 2019-12-19 | Discharge: 2019-12-19 | Disposition: A | Discharge status: Home / Self Care | Payer: 59 | Attending: Obstetrics & Gynecology | Admitting: Obstetrics & Gynecology

## 2019-12-19 ENCOUNTER — Telehealth: Payer: Self-pay | Admitting: Obstetrics & Gynecology

## 2019-12-19 ENCOUNTER — Encounter (HOSPITAL_COMMUNITY)
Admission: RE | Disposition: A | Discharge status: Home / Self Care | Payer: Self-pay | Source: Home / Self Care | Attending: Obstetrics & Gynecology

## 2019-12-19 ENCOUNTER — Encounter (HOSPITAL_COMMUNITY): Payer: Self-pay | Admitting: Obstetrics & Gynecology

## 2019-12-19 DIAGNOSIS — R1031 Right lower quadrant pain: Secondary | ICD-10-CM | POA: Insufficient documentation

## 2019-12-19 DIAGNOSIS — Z90721 Acquired absence of ovaries, unilateral: Secondary | ICD-10-CM

## 2019-12-19 DIAGNOSIS — N838 Other noninflammatory disorders of ovary, fallopian tube and broad ligament: Secondary | ICD-10-CM | POA: Insufficient documentation

## 2019-12-19 DIAGNOSIS — Z91040 Latex allergy status: Secondary | ICD-10-CM | POA: Insufficient documentation

## 2019-12-19 DIAGNOSIS — Z90710 Acquired absence of both cervix and uterus: Secondary | ICD-10-CM | POA: Insufficient documentation

## 2019-12-19 DIAGNOSIS — Z9079 Acquired absence of other genital organ(s): Secondary | ICD-10-CM | POA: Insufficient documentation

## 2019-12-19 HISTORY — PX: LAPAROSCOPIC UNILATERAL SALPINGO OOPHERECTOMY: SHX5935

## 2019-12-19 SURGERY — SALPINGO-OOPHORECTOMY, UNILATERAL, LAPAROSCOPIC
Anesthesia: General | Site: Vagina | Laterality: Right

## 2019-12-19 MED ORDER — PROPOFOL 10 MG/ML IV BOLUS
INTRAVENOUS | Status: DC | PRN
  Administered 2019-12-19: 120 mg via INTRAVENOUS

## 2019-12-19 MED ORDER — ONDANSETRON 8 MG PO TBDP: 8.0000 mg | ORAL_TABLET | Freq: Three times a day (TID) | ORAL | 0 refills | Status: AC | PRN

## 2019-12-19 MED ORDER — LIDOCAINE 2% (20 MG/ML) 5 ML SYRINGE
INTRAMUSCULAR | Status: AC
  Filled 2019-12-19: qty 10

## 2019-12-19 MED ORDER — FENTANYL CITRATE (PF) 250 MCG/5ML IJ SOLN
INTRAMUSCULAR | Status: AC
  Filled 2019-12-19: qty 5

## 2019-12-19 MED ORDER — BUPIVACAINE LIPOSOME 1.3 % IJ SUSP
INTRAMUSCULAR | Status: AC
  Filled 2019-12-19: qty 20

## 2019-12-19 MED ORDER — FENTANYL CITRATE (PF) 100 MCG/2ML IJ SOLN
25.0000 ug | INTRAMUSCULAR | Status: DC | PRN
  Administered 2019-12-19: 50 ug via INTRAVENOUS
  Filled 2019-12-19: qty 2

## 2019-12-19 MED ORDER — BUPIVACAINE LIPOSOME 1.3 % IJ SUSP: 20.0000 mL | Freq: Once | INTRAMUSCULAR | Status: DC

## 2019-12-19 MED ORDER — MIDAZOLAM HCL 2 MG/2ML IJ SOLN
INTRAMUSCULAR | Status: AC
  Filled 2019-12-19: qty 2

## 2019-12-19 MED ORDER — BUPIVACAINE LIPOSOME 1.3 % IJ SUSP
INTRAMUSCULAR | Status: DC | PRN
  Administered 2019-12-19: 20 mL

## 2019-12-19 MED ORDER — POVIDONE-IODINE 10 % EX SWAB: 2.0000 "application " | Freq: Once | CUTANEOUS | Status: DC

## 2019-12-19 MED ORDER — ROCURONIUM BROMIDE 10 MG/ML (PF) SYRINGE
PREFILLED_SYRINGE | INTRAVENOUS | Status: AC
  Filled 2019-12-19: qty 20

## 2019-12-19 MED ORDER — KETOROLAC TROMETHAMINE 30 MG/ML IJ SOLN
30.0000 mg | Freq: Once | INTRAMUSCULAR | Status: AC
  Administered 2019-12-19: 30 mg via INTRAVENOUS

## 2019-12-19 MED ORDER — DESOGESTREL-ETHINYL ESTRADIOL 0.15-30 MG-MCG PO TABS: 1.0000 | ORAL_TABLET | Freq: Every day | ORAL | 11 refills | Status: DC

## 2019-12-19 MED ORDER — 0.9 % SODIUM CHLORIDE (POUR BTL) OPTIME
TOPICAL | Status: DC | PRN
  Administered 2019-12-19: 1000 mL

## 2019-12-19 MED ORDER — ROCURONIUM BROMIDE 10 MG/ML (PF) SYRINGE
PREFILLED_SYRINGE | INTRAVENOUS | Status: AC
  Filled 2019-12-19: qty 10

## 2019-12-19 MED ORDER — SUGAMMADEX SODIUM 200 MG/2ML IV SOLN
INTRAVENOUS | Status: DC | PRN
  Administered 2019-12-19: 200 mg via INTRAVENOUS

## 2019-12-19 MED ORDER — MIDAZOLAM HCL 5 MG/5ML IJ SOLN
INTRAMUSCULAR | Status: DC | PRN
  Administered 2019-12-19: 2 mg via INTRAVENOUS

## 2019-12-19 MED ORDER — KETOROLAC TROMETHAMINE 30 MG/ML IJ SOLN
INTRAMUSCULAR | Status: AC
  Filled 2019-12-19: qty 1

## 2019-12-19 MED ORDER — ROCURONIUM 10MG/ML (10ML) SYRINGE FOR MEDFUSION PUMP - OPTIME
INTRAVENOUS | Status: DC | PRN
  Administered 2019-12-19: 30 mg via INTRAVENOUS

## 2019-12-19 MED ORDER — KETOROLAC TROMETHAMINE 10 MG PO TABS: 10.0000 mg | ORAL_TABLET | Freq: Three times a day (TID) | ORAL | 0 refills | Status: AC | PRN

## 2019-12-19 MED ORDER — CEFAZOLIN SODIUM-DEXTROSE 2-4 GM/100ML-% IV SOLN
INTRAVENOUS | Status: AC
  Filled 2019-12-19: qty 100

## 2019-12-19 MED ORDER — OXYCODONE-ACETAMINOPHEN 7.5-325 MG PO TABS
1.0000 | ORAL_TABLET | Freq: Four times a day (QID) | ORAL | 0 refills | Status: AC | PRN
Start: 2019-12-19 — End: ?

## 2019-12-19 MED ORDER — ORAL CARE MOUTH RINSE: 15.0000 mL | Freq: Once | OROMUCOSAL | Status: AC

## 2019-12-19 MED ORDER — LACTATED RINGERS IV SOLN
INTRAVENOUS | Status: DC
  Administered 2019-12-19: 1000 mL via INTRAVENOUS

## 2019-12-19 MED ORDER — ONDANSETRON HCL 4 MG/2ML IJ SOLN
INTRAMUSCULAR | Status: DC | PRN
  Administered 2019-12-19: 4 mg via INTRAVENOUS

## 2019-12-19 MED ORDER — LIDOCAINE HCL 1 % IJ SOLN
INTRAMUSCULAR | Status: DC | PRN
  Administered 2019-12-19: 60 mg via INTRADERMAL

## 2019-12-19 MED ORDER — FENTANYL CITRATE (PF) 100 MCG/2ML IJ SOLN
INTRAMUSCULAR | Status: DC | PRN
  Administered 2019-12-19 (×5): 50 ug via INTRAVENOUS

## 2019-12-19 MED ORDER — PROPOFOL 10 MG/ML IV BOLUS
INTRAVENOUS | Status: AC
  Filled 2019-12-19: qty 20

## 2019-12-19 MED ORDER — ONDANSETRON HCL 4 MG/2ML IJ SOLN: 4.0000 mg | Freq: Once | INTRAMUSCULAR | Status: DC | PRN

## 2019-12-19 MED ORDER — CHLORHEXIDINE GLUCONATE 0.12 % MT SOLN
15.0000 mL | Freq: Once | OROMUCOSAL | Status: AC
  Administered 2019-12-19: 15 mL via OROMUCOSAL

## 2019-12-19 MED ORDER — CEFAZOLIN SODIUM-DEXTROSE 2-4 GM/100ML-% IV SOLN
2.0000 g | INTRAVENOUS | Status: AC
  Administered 2019-12-19: 2 g via INTRAVENOUS

## 2019-12-19 MED ORDER — ONDANSETRON HCL 4 MG/2ML IJ SOLN
INTRAMUSCULAR | Status: AC
  Filled 2019-12-19: qty 6

## 2019-12-19 SURGICAL SUPPLY — 52 items
ADH SKN CLS APL DERMABOND .7 (GAUZE/BANDAGES/DRESSINGS) ×1
APPLIER CLIP ROT 10 11.4 M/L (STAPLE)
APPLIER CLIP UNV 5X34 EPIX (ENDOMECHANICALS) IMPLANT
APR CLP MED LRG 11.4X10 (STAPLE)
APR XCLPCLP 20M/L UNV 34X5 (ENDOMECHANICALS)
BAG HAMPER (MISCELLANEOUS) ×3 IMPLANT
BAG RETRIEVAL 10 (BASKET) ×1
BAG RETRIEVAL 10MM (BASKET) ×1
BLADE SURG SZ11 CARB STEEL (BLADE) ×3 IMPLANT
CLIP APPLIE ROT 10 11.4 M/L (STAPLE) IMPLANT
CLOTH BEACON ORANGE TIMEOUT ST (SAFETY) ×3 IMPLANT
COVER LIGHT HANDLE STERIS (MISCELLANEOUS) ×6 IMPLANT
COVER WAND RF STERILE (DRAPES) ×3 IMPLANT
DERMABOND ADVANCED (GAUZE/BANDAGES/DRESSINGS) ×2
DERMABOND ADVANCED .7 DNX12 (GAUZE/BANDAGES/DRESSINGS) ×1 IMPLANT
ELECT REM PT RETURN 9FT ADLT (ELECTROSURGICAL) ×3
ELECTRODE REM PT RTRN 9FT ADLT (ELECTROSURGICAL) ×1 IMPLANT
GAUZE 4X4 16PLY RFD (DISPOSABLE) ×3 IMPLANT
GLOVE BIOGEL PI IND STRL 7.0 (GLOVE) ×2 IMPLANT
GLOVE BIOGEL PI IND STRL 7.5 (GLOVE) ×1 IMPLANT
GLOVE BIOGEL PI IND STRL 8 (GLOVE) ×1 IMPLANT
GLOVE BIOGEL PI INDICATOR 7.0 (GLOVE) ×4
GLOVE BIOGEL PI INDICATOR 7.5 (GLOVE) ×2
GLOVE BIOGEL PI INDICATOR 8 (GLOVE) ×2
GOWN STRL REUS W/TWL LRG LVL3 (GOWN DISPOSABLE) ×3 IMPLANT
GOWN STRL REUS W/TWL XL LVL3 (GOWN DISPOSABLE) ×6 IMPLANT
INST SET LAPROSCOPIC GYN AP (KITS) ×3 IMPLANT
IV NS IRRIG 3000ML ARTHROMATIC (IV SOLUTION) IMPLANT
KIT TURNOVER KIT A (KITS) ×3 IMPLANT
MANIFOLD NEPTUNE II (INSTRUMENTS) ×3 IMPLANT
NEEDLE HYPO 18GX1.5 BLUNT FILL (NEEDLE) ×3 IMPLANT
NEEDLE HYPO 21X1.5 SAFETY (NEEDLE) ×3 IMPLANT
NEEDLE INSUFFLATION 120MM (ENDOMECHANICALS) ×3 IMPLANT
PACK PERI GYN (CUSTOM PROCEDURE TRAY) ×3 IMPLANT
PAD ARMBOARD 7.5X6 YLW CONV (MISCELLANEOUS) ×3 IMPLANT
SET BASIN LINEN APH (SET/KITS/TRAYS/PACK) ×3 IMPLANT
SET TUBE IRRIG SUCTION NO TIP (IRRIGATION / IRRIGATOR) IMPLANT
SET TUBE SMOKE EVAC HIGH FLOW (TUBING) ×3 IMPLANT
SHEARS HARMONIC ACE PLUS 36CM (ENDOMECHANICALS) ×3 IMPLANT
SLEEVE XCEL OPT CAN 5 100 (ENDOMECHANICALS) ×3 IMPLANT
SOL ANTI FOG 6CC (MISCELLANEOUS) ×1 IMPLANT
SOLUTION ANTI FOG 6CC (MISCELLANEOUS) ×2
SPONGE GAUZE 2X2 8PLY STER LF (GAUZE/BANDAGES/DRESSINGS) ×3
SPONGE GAUZE 2X2 8PLY STRL LF (GAUZE/BANDAGES/DRESSINGS) ×6 IMPLANT
STAPLER VISISTAT 35W (STAPLE) IMPLANT
SUT VICRYL 0 UR6 27IN ABS (SUTURE) ×3 IMPLANT
SYR 20ML LL LF (SYRINGE) ×6 IMPLANT
SYS BAG RETRIEVAL 10MM (BASKET) ×1
SYSTEM BAG RETRIEVAL 10MM (BASKET) ×1 IMPLANT
TROCAR XCEL NON-BLD 11X100MML (ENDOMECHANICALS) ×3 IMPLANT
TROCAR XCEL NON-BLD 5MMX100MML (ENDOMECHANICALS) ×3 IMPLANT
WARMER LAPAROSCOPE (MISCELLANEOUS) ×3 IMPLANT

## 2019-12-19 NOTE — Telephone Encounter (Signed)
John, pharmacist at [facility] called to ask if they can change directions on pt's oxyCODONE-acetaminophen (PERCOCET) 7.5-325 MG tablet   Due to this is a 1st fill for patient, the MME has to be 50 & current directions has it at 90  Wants to change directions to 1 every 6 hours  Please advise & notify pharmacy

## 2019-12-19 NOTE — Op Note (Signed)
Preoperative diagnosis:  1.  RLQ pain                                         2.  S/P previous hysterectomy and previous removal of left tube and ovary  Postoperative diagnosis:  Same as above  Procedure:  Laparoscopic right salpingo-oophorectomy  Surgeon:  Rockne Coons MD  Anesthesia:  Gen. Endotracheal  Findings:  Normal right tube ovary, mild filmy adhesions, post operative of omentum   Description of operation:  The patient was taken to the operating room and placed in the supine position where she underwent general endotracheal anesthesia.  She was placed in the low lithotomy position.  She was prepped and draped in the usual sterile fashion and a Foley catheter was placed.  An incision was made in the umbilicus and a Veres needle was placed into the peritoneal cavity with one pass without difficulty.  The peritoneal cavity was then insufflated.  An 11 mm non-bladed direct visualization trocar was then used and placed into the peritoneal cavity with direct laparoscopic visualization again without difficulty.  Incisions were then made in the left lower quadrant and right lower quadrant.  A 5 mm trocar was placed in the left lower quadrant and a 5 mm trocar was placed in the right lower quadrant.  Both were done under direct visualization without difficulty.  The right ovary was grasped.  The harmonic scalpel was used and the infundibulopelvic ligament on the right was coagulated and then transected.  .  An Endo Catch was placed in the right ovary was put in the bag and removed.  The umbilical fascia was then reapproximated with 2-0 Vicryl.     The subcutaneous tissue was reapproximated loosely using 2-0 Vicryl.  The skin was closed using 3-0 vicryl in a subcuticular fashion and dermabond The patient was awakened from anesthesia and taken to the recovery room in good stable condition with all counts being correct x3.  The estimated blood loss for the procedure was minimal, no  intraperitoneal.  She received ancef and toradol preoperatively  She will be discharged from the recovery room.  Lazaro Arms, MD 12/19/2019 11:07 AM

## 2019-12-19 NOTE — Anesthesia Procedure Notes (Signed)
Procedure Name: Intubation Date/Time: 12/19/2019 10:13 AM Performed by: Ollen Bowl, CRNA Pre-anesthesia Checklist: Patient identified, Patient being monitored, Timeout performed, Emergency Drugs available and Suction available Patient Re-evaluated:Patient Re-evaluated prior to induction Oxygen Delivery Method: Circle system utilized Preoxygenation: Pre-oxygenation with 100% oxygen Induction Type: IV induction Ventilation: Mask ventilation without difficulty Laryngoscope Size: Mac and 3 Grade View: Grade I Tube type: Oral Tube size: 7.0 mm Number of attempts: 1 Airway Equipment and Method: Stylet Placement Confirmation: ETT inserted through vocal cords under direct vision,  positive ETCO2 and breath sounds checked- equal and bilateral Secured at: 21 cm Tube secured with: Tape Dental Injury: Teeth and Oropharynx as per pre-operative assessment

## 2019-12-19 NOTE — Transfer of Care (Signed)
Immediate Anesthesia Transfer of Care Note  Patient: Jessica Poole  Procedure(s) Performed: LAPAROSCOPIC RIGHT SALPINGO OOPHORECTOMY (Right Vagina )  Patient Location: PACU  Anesthesia Type:General  Level of Consciousness: awake  Airway & Oxygen Therapy: Patient Spontanous Breathing  Post-op Assessment: Report given to RN  Post vital signs: Reviewed and stable  Last Vitals:  Vitals Value Taken Time  BP 134/118 12/19/19 1118  Temp 36.6 C 12/19/19 1118  Pulse 65 12/19/19 1119  Resp 14 12/19/19 1119  SpO2 95 % 12/19/19 1119  Vitals shown include unvalidated device data.  Last Pain:  Vitals:   12/19/19 0907  TempSrc: Oral  PainSc: 0-No pain      Patients Stated Pain Goal: 8 (12/19/19 9678)  Complications: No complications documented.

## 2019-12-19 NOTE — Discharge Instructions (Signed)
[facility] THE [facility] UNTIL Sunday December 23, 2019. DO NOT USE ANY FURTHER NUMBING MEDICATIONS UNTIL AFTER Sunday December 23, 2019 WITHOUT CONSULTING A PHYSICIAN     Laparoscopic ovary removal, Care After This sheet gives you information about how to care for yourself after your procedure. Your health care provider may also give you more specific instructions. If you have problems or questions, contact your health care provider. What can I expect after the procedure? After the procedure, it is common to have:  A sore throat.  Discomfort in your shoulder.  Mild discomfort or cramping in your abdomen.  Gas pains.  Pain or soreness in the area where the surgical incision was made.  A bloated feeling.  Tiredness.  Nausea.  Vomiting. Follow these instructions at home: Medicines  Take over-the-counter and prescription medicines only as told by your health care provider.  Do not take aspirin because it can cause bleeding.  Ask your health care provider if the medicine prescribed to you: ? Requires you to avoid driving or using heavy machinery. ? Can cause constipation. You may need to take actions to prevent or treat constipation, such as:  Drink enough fluid to keep your urine pale yellow.  Take over-the-counter or prescription medicines.  Eat foods that are high in fiber, such as beans, whole grains, and fresh fruits and vegetables.  Limit foods that are high in fat and processed sugars, such as fried or sweet foods. Incision care      Follow instructions from your health care provider about how to take care of your incision. Make sure you: ? Wash your hands with soap and water before and after you change your bandage (dressing). If soap and water are not available, use hand sanitizer. ? Change your dressing as told by your health care provider. ? Leave stitches (sutures), skin glue, or adhesive strips in place. These skin closures may need to  stay in place for 2 weeks or longer. If adhesive strip edges start to loosen and curl up, you may trim the loose edges. Do not remove adhesive strips completely unless your health care provider tells you to do that.  Check your incision area every day for signs of infection. Check for: ? Redness, swelling, or pain. ? Fluid or blood. ? Warmth. ? Pus or a bad smell. Activity  Rest as told by your health care provider.  Avoid sitting for a long time without moving. Get up to take short walks every 1-2 hours. This is important to improve blood flow and breathing. Ask for help if you feel weak or unsteady.  Return to your normal activities as told by your health care provider. Ask your health care provider what activities are safe for you. General instructions  Do not take baths, swim, or use a hot tub until your health care provider approves. Ask your health care provider if you may take showers. You may only be allowed to take sponge baths.  Have someone help you with your daily household tasks for the first few days.  Keep all follow-up visits as told by your health care provider. This is important. Contact a health care provider if:  You have redness, swelling, or pain around your incision.  Your incision feels warm to the touch.  You have pus or a bad smell coming from your incision.  The edges of your incision break open after the sutures have been removed.  Your pain does not improve after 2-3 days.  You have a rash.  You repeatedly become dizzy or light-headed.  Your pain medicine is not helping. Get help right away if you:  Have a fever.  Faint.  Have increasing pain in your abdomen.  Have severe pain in one or both of your shoulders.  Have fluid or blood coming from your sutures or from your vagina.  Have shortness of breath or difficulty breathing.  Have chest pain or leg pain.  Have ongoing nausea, vomiting, or diarrhea. Summary  After the procedure, it  is common to have mild discomfort or cramping in your abdomen.  Take over-the-counter and prescription medicines only as told by your health care provider.  Watch for symptoms that should prompt you to call your health care provider.  Keep all follow-up visits as told by your health care provider. This is important. This information is not intended to replace advice given to you by your health care provider. Make sure you discuss any questions you have with your health care provider. Document Revised: 07/11/2018 Document Reviewed: 12/27/2017 Elsevier Patient Education  2020 [employer] Inc[employer]     General Anesthesia, Adult, Care After This sheet gives you information about how to care for yourself after your procedure. Your health care provider may also give you more specific instructions. If you have problems or questions, contact your health care provider. What can I expect after the procedure? After the procedure, the following side effects are common:  Pain or discomfort at the IV site.  Nausea.  Vomiting.  Sore throat.  Trouble concentrating.  Feeling cold or chills.  Weak or tired.  Sleepiness and fatigue.  Soreness and body aches. These side effects can affect parts of the body that were not involved in surgery. Follow these instructions at home:  For at least 24 hours after the procedure:  Have a responsible adult stay with you. It is important to have someone help care for you until you are awake and alert.  Rest as needed.  Do not: ? Participate in activities in which you could fall or become injured. ? Drive. ? Use heavy machinery. ? Drink alcohol. ? Take sleeping pills or medicines that cause drowsiness. ? Make important decisions or sign legal documents. ? Take care of children on your own. Eating and drinking  Follow any instructions from your health care provider about eating or drinking restrictions.  When you feel hungry, start by eating small  amounts of foods that are soft and easy to digest (bland), such as toast. Gradually return to your regular diet.  Drink enough fluid to keep your urine pale yellow.  If you vomit, rehydrate by drinking water, juice, or clear broth. General instructions  If you have sleep apnea, surgery and certain medicines can increase your risk for breathing problems. Follow instructions from your health care provider about wearing your sleep device: ? Anytime you are sleeping, including during daytime naps. ? While taking prescription pain medicines, sleeping medicines, or medicines that make you drowsy.  Return to your normal activities as told by your health care provider. Ask your health care provider what activities are safe for you.  Take over-the-counter and prescription medicines only as told by your health care provider.  If you smoke, do not smoke without supervision.  Keep all follow-up visits as told by your health care provider. This is important. Contact a health care provider if:  You have nausea or vomiting that does not get better with medicine.  You cannot eat or drink without  vomiting.  You have pain that does not get better with medicine.  You are unable to pass urine.  You develop a skin rash.  You have a fever.  You have redness around your IV site that gets worse. Get help right away if:  You have difficulty breathing.  You have chest pain.  You have blood in your urine or stool, or you vomit blood. Summary  After the procedure, it is common to have a sore throat or nausea. It is also common to feel tired.  Have a responsible adult stay with you for the first 24 hours after general anesthesia. It is important to have someone help care for you until you are awake and alert.  When you feel hungry, start by eating small amounts of foods that are soft and easy to digest (bland), such as toast. Gradually return to your regular diet.  Drink enough fluid to keep your  urine pale yellow.  Return to your normal activities as told by your health care provider. Ask your health care provider what activities are safe for you. This information is not intended to replace advice given to you by your health care provider. Make sure you discuss any questions you have with your health care provider. Document Revised: 02/04/2017 Document Reviewed: 09/17/2016 Elsevier Patient Education  2020 [employer].

## 2019-12-19 NOTE — Anesthesia Preprocedure Evaluation (Signed)
Anesthesia Evaluation  Patient identified by MRN, date of birth, ID band Patient awake    Reviewed: Allergy & Precautions, H&P , NPO status , Patient's Chart, lab work & pertinent test results, reviewed documented beta blocker date and time   Airway Mallampati: II  TM Distance: >3 FB Neck ROM: full    Dental no notable dental hx. (+) Teeth Intact   Pulmonary neg pulmonary ROS,    Pulmonary exam normal breath sounds clear to auscultation       Cardiovascular Exercise Tolerance: Good negative cardio ROS   Rhythm:regular Rate:Normal     Neuro/Psych PSYCHIATRIC DISORDERS Anxiety Depression Schizophrenia negative neurological ROS     GI/Hepatic negative GI ROS, Neg liver ROS,   Endo/Other  negative endocrine ROS  Renal/GU negative Renal ROS  negative genitourinary   Musculoskeletal   Abdominal   Peds  Hematology negative hematology ROS (+)   Anesthesia Other Findings   Reproductive/Obstetrics negative OB ROS                             Anesthesia Physical Anesthesia Plan  ASA: II  Anesthesia Plan: General   Post-op Pain Management:    Induction:   PONV Risk Score and Plan: Ondansetron  Airway Management Planned:   Additional Equipment:   Intra-op Plan:   Post-operative Plan:   Informed Consent: I have reviewed the patients History and Physical, chart, labs and discussed the procedure including the risks, benefits and alternatives for the proposed anesthesia with the patient or authorized representative who has indicated his/her understanding and acceptance.     Dental Advisory Given  Plan Discussed with: CRNA  Anesthesia Plan Comments:         Anesthesia Quick Evaluation

## 2019-12-19 NOTE — H&P (Signed)
Preoperative History and Physical  Jessica Poole is a 24 y.o. [phone] with Patient's last menstrual period was 03/27/2018 (exact date). admitted for a laparoscopic RSO.  Persistent pain RLQ, sonogram normal No evidence of adhesions on sonogram Pt states same pain as on left which resolved with LSO, strongly desires RSO which she knows will make her menopausal at such a young age  PMH:    Past Medical History:  Diagnosis Date  . Anxiety   . Arthritis   . Depression   . Major depressive disorder   . Schizophrenia (HCC)     PSH:     Past Surgical History:  Procedure Laterality Date  . ABDOMINAL HYSTERECTOMY    . CESAREAN SECTION    . HERNIA REPAIR Right    24 yrs old-inguinal  . LAPAROSCOPIC LYSIS OF ADHESIONS  12/21/2016   Procedure: LAPAROSCOPIC LYSIS OF ADHESIONS;  Surgeon: Tilda Burrow, MD;  Location: [facility];  Service: Gynecology;;  . LAPAROSCOPIC SALPINGO OOPHERECTOMY Left 12/21/2016   Procedure: LAPAROSCOPIC LEFT SALPINGO OOPHORECTOMY;  Surgeon: Tilda Burrow, MD;  Location: [facility];  Service: Gynecology;  Laterality: Left;  . LAPAROSCOPIC TUBAL LIGATION Bilateral 01/04/2018   Procedure: LAPAROSCOPIC RIGHT TUBAL LIGATION USING ELECTROCAUTERY;  Surgeon: Lazaro Arms, MD;  Location: [facility];  Service: Gynecology;  Laterality: Bilateral;  . LAPAROSCOPY  12/21/2016   Procedure: LAPAROSCOPY DIAGNOSTIC;  Surgeon: Tilda Burrow, MD;  Location: [facility];  Service: Gynecology;;  . TUBAL LIGATION    . VAGINAL HYSTERECTOMY N/A 05/03/2018   Procedure: HYSTERECTOMY VAGINAL;  Surgeon: Lazaro Arms, MD;  Location: [facility];  Service: Gynecology;  Laterality: N/A;    POb/GynH:      OB History    Gravida  2   Para  2   Term  1   Preterm  1   AB  0   Living  2     SAB      TAB      Ectopic      Multiple  0   Live Births  2           SH:   Social History   Tobacco Use  . Smoking status: Never Smoker  . Smokeless tobacco: Never Used  Vaping Use  .  Vaping Use: Never used  Substance Use Topics  . Alcohol use: Yes    Comment: occ  . Drug use: Not Currently    Types: Marijuana    Comment: pt tried marijuana recently    FH:    Family History  Problem Relation Age of Onset  . Drug abuse Sister   . Hernia Son   . Heart disease Paternal Grandfather   . Alzheimer's disease Paternal Grandfather   . Depression Maternal Grandmother   . Bipolar disorder Maternal Grandmother   . Drug abuse Father   . Hypertension Father   . Depression Father   . Alcohol abuse Mother   . Hypertension Paternal Aunt   . Schizophrenia Cousin      Allergies:  Allergies  Allergen Reactions  . Latex Rash    Medications:       Current Facility-Administered Medications:  .  bupivacaine liposome (EXPAREL) 1.3 % injection 266 mg, 20 mL, Infiltration, Once, Crytal Pensinger H, MD .  ceFAZolin (ANCEF) IVPB 2g/100 mL premix, 2 g, Intravenous, On Call to OR, Lazaro Arms, MD .  lactated ringers infusion, , Intravenous, Continuous, Kiel, Mosetta Putt, MD, Last Rate: 10 mL/hr at 12/19/19 0930,  1,000 mL at 12/19/19 0930 .  povidone-iodine 10 % swab 2 application, 2 application, Topical, Once, Deboraha Goar, Amaryllis Dyke, MD  Review of Systems:   Review of Systems  Constitutional: Negative for fever, chills, weight loss, malaise/fatigue and diaphoresis.  HENT: Negative for hearing loss, ear pain, nosebleeds, congestion, sore throat, neck pain, tinnitus and ear discharge.   Eyes: Negative for blurred vision, double vision, photophobia, pain, discharge and redness.  Respiratory: Negative for cough, hemoptysis, sputum production, shortness of breath, wheezing and stridor.   Cardiovascular: Negative for chest pain, palpitations, orthopnea, claudication, leg swelling and PND.  Gastrointestinal: Positive for abdominal pain. Negative for heartburn, nausea, vomiting, diarrhea, constipation, blood in stool and melena.  Genitourinary: Negative for dysuria, urgency, frequency, hematuria  and flank pain.  Musculoskeletal: Negative for myalgias, back pain, joint pain and falls.  Skin: Negative for itching and rash.  Neurological: Negative for dizziness, tingling, tremors, sensory change, speech change, focal weakness, seizures, loss of consciousness, weakness and headaches.  Endo/Heme/Allergies: Negative for environmental allergies and polydipsia. Does not bruise/bleed easily.  Psychiatric/Behavioral: Negative for depression, suicidal ideas, hallucinations, memory loss and substance abuse. The patient is not nervous/anxious and does not have insomnia.      PHYSICAL EXAM:  Blood pressure 108/88, pulse 76, temperature 98.2 F (36.8 C), temperature source Oral, resp. rate 20, last menstrual period 03/27/2018, SpO2 100 %.    Vitals reviewed. Constitutional: She is oriented to person, place, and time. She appears well-developed and well-nourished.  HENT:  Head: Normocephalic and atraumatic.  Right Ear: External ear normal.  Left Ear: External ear normal.  Nose: Nose normal.  Mouth/Throat: Oropharynx is clear and moist.  Eyes: Conjunctivae and EOM are normal. Pupils are equal, round, and reactive to light. Right eye exhibits no discharge. Left eye exhibits no discharge. No scleral icterus.  Neck: Normal range of motion. Neck supple. No tracheal deviation present. No thyromegaly present.  Cardiovascular: Normal rate, regular rhythm, normal heart sounds and intact distal pulses.  Exam reveals no gallop and no friction rub.   No murmur heard. Respiratory: Effort normal and breath sounds normal. No respiratory distress. She has no wheezes. She has no rales. She exhibits no tenderness.  GI: Soft. Bowel sounds are normal. She exhibits no distension and no mass. There is tenderness. There is no rebound and no guarding.  Genitourinary:       Vulva is normal without lesions Vagina is pink moist without discharge Cervix normal in appearance and pap is normal Uterus is uterus  absent Adnexa is negativeleft surgically absent Musculoskeletal: Normal range of motion. She exhibits no edema and no tenderness.  Neurological: She is alert and oriented to person, place, and time. She has normal reflexes. She displays normal reflexes. No cranial nerve deficit. She exhibits normal muscle tone. Coordination normal.  Skin: Skin is warm and dry. No rash noted. No erythema. No pallor.  Psychiatric: She has a normal mood and affect. Her behavior is normal. Judgment and thought content normal.    Labs: Results for orders placed or performed during the hospital encounter of 12/18/19 (from the past 336 hour(s))  SARS CORONAVIRUS 2 (TAT 6-24 HRS) Nasopharyngeal Nasopharyngeal Swab   Collection Time: 12/18/19  8:54 AM   Specimen: Nasopharyngeal Swab  Result Value Ref Range   SARS Coronavirus 2 NEGATIVE NEGATIVE  CBC   Collection Time: 12/18/19  8:54 AM  Result Value Ref Range   WBC 7.7 4.0 - 10.5 K/uL   RBC 4.49 3.87 - 5.11 MIL/uL  Hemoglobin 14.5 12.0 - 15.0 g/dL   HCT 62.3 36 - 46 %   MCV 94.4 80.0 - 100.0 fL   MCH 32.3 26.0 - 34.0 pg   MCHC 34.2 30.0 - 36.0 g/dL   RDW 76.2 83.1 - 51.7 %   Platelets 227 150 - 400 K/uL   nRBC 0.0 0.0 - 0.2 %  Comprehensive metabolic panel   Collection Time: 12/18/19  8:54 AM  Result Value Ref Range   Sodium 136 135 - 145 mmol/L   Potassium 3.5 3.5 - 5.1 mmol/L   Chloride 103 98 - 111 mmol/L   CO2 25 22 - 32 mmol/L   Glucose, Bld 94 70 - 99 mg/dL   BUN 12 6 - 20 mg/dL   Creatinine, Ser 6.16 0.44 - 1.00 mg/dL   Calcium 9.8 8.9 - 07.3 mg/dL   Total Protein 8.5 (H) 6.5 - 8.1 g/dL   Albumin 5.3 (H) 3.5 - 5.0 g/dL   AST 20 15 - 41 U/L   ALT 15 0 - 44 U/L   Alkaline Phosphatase 69 38 - 126 U/L   Total Bilirubin 0.9 0.3 - 1.2 mg/dL   GFR, Estimated >71 >06 mL/min   Anion gap 8 5 - 15  Rapid HIV screen (HIV 1/2 Ab+Ag)   Collection Time: 12/18/19  8:54 AM  Result Value Ref Range   HIV-1 P24 Antigen - HIV24 NON REACTIVE NON REACTIVE    HIV 1/2 Antibodies NON REACTIVE NON REACTIVE   Interpretation (HIV Ag Ab)      A non reactive test result means that HIV 1 or HIV 2 antibodies and HIV 1 p24 antigen were not detected in the specimen.  Urinalysis, Routine w reflex microscopic Urine, Clean Catch   Collection Time: 12/18/19  8:54 AM  Result Value Ref Range   Color, Urine STRAW (A) YELLOW   APPearance CLEAR CLEAR   Specific Gravity, Urine 1.010 1.005 - 1.030   pH 7.0 5.0 - 8.0   Glucose, UA NEGATIVE NEGATIVE mg/dL   Hgb urine dipstick NEGATIVE NEGATIVE   Bilirubin Urine NEGATIVE NEGATIVE   Ketones, ur NEGATIVE NEGATIVE mg/dL   Protein, ur NEGATIVE NEGATIVE mg/dL   Nitrite NEGATIVE NEGATIVE   Leukocytes,Ua NEGATIVE NEGATIVE  Type and screen   Collection Time: 12/18/19  8:54 AM  Result Value Ref Range   ABO/RH(D) B POS    Antibody Screen NEG    Sample Expiration 01/01/2020,2359    Extend sample reason      NO TRANSFUSIONS OR PREGNANCY IN THE PAST 3 MONTHS Performed at [facility], [address] [place], [place] [address]     EKG: Orders placed or performed during the hospital encounter of 05/22/19  . ED EKG  . ED EKG  . EKG 12-Lead  . EKG 12-Lead    Imaging Studies: No results found.    Assessment: RLQ pain, similar to pain on left resolved with LSO S/P TVH S/P Laparoscopic LSO    Plan: Laparoscopic removal of right tube and ovary  Lazaro Arms 12/19/2019 9:44 AM

## 2019-12-20 ENCOUNTER — Encounter (HOSPITAL_COMMUNITY): Payer: Self-pay | Admitting: Obstetrics & Gynecology

## 2019-12-20 LAB — SURGICAL PATHOLOGY

## 2019-12-20 NOTE — Anesthesia Postprocedure Evaluation (Signed)
Anesthesia Post Note  Patient: Jessica Poole  Procedure(s) Performed: LAPAROSCOPIC RIGHT SALPINGO OOPHORECTOMY (Right Vagina )  Patient location during evaluation: Phase II Anesthesia Type: General Level of consciousness: awake Pain management: pain level controlled Vital Signs Assessment: post-procedure vital signs reviewed and stable Respiratory status: spontaneous breathing Cardiovascular status: blood pressure returned to baseline Postop Assessment: no headache Anesthetic complications: no   No complications documented.   Last Vitals:  Vitals:   12/19/19 1200 12/19/19 1212  BP: 102/69 109/77  Pulse: (!) 50 74  Resp: 16 18  Temp:  36.5 C  SpO2: 99% 100%    Last Pain:  Vitals:   12/20/19 1620  TempSrc:   PainSc: 0-No pain                 Windell Norfolk

## 2019-12-27 ENCOUNTER — Encounter: Payer: Self-pay | Admitting: Obstetrics & Gynecology

## 2020-01-18 ENCOUNTER — Encounter: Payer: Self-pay | Admitting: Obstetrics & Gynecology

## 2020-01-18 ENCOUNTER — Ambulatory Visit (INDEPENDENT_AMBULATORY_CARE_PROVIDER_SITE_OTHER): Payer: 59 | Admitting: Obstetrics & Gynecology

## 2020-01-18 ENCOUNTER — Other Ambulatory Visit: Payer: Self-pay

## 2020-01-18 VITALS — BP 107/81 | HR 118 | Ht 60.0 in | Wt 110.0 lb

## 2020-01-18 DIAGNOSIS — Z9889 Other specified postprocedural states: Secondary | ICD-10-CM

## 2020-01-18 MED ORDER — ESTRADIOL 2 MG PO TABS: 2.0000 mg | ORAL_TABLET | Freq: Every day | ORAL | 11 refills | Status: AC

## 2020-01-18 NOTE — Progress Notes (Signed)
  HPI: Patient returns for routine postoperative follow-up having undergone lap rso on 01/02/20.  The patient's immediate postoperative recovery has been unremarkable. Since hospital discharge the patient reports no problems except moody on OCP which I take to be secondary to progesterone.   Current Outpatient Medications: desogestrel-ethinyl estradiol (APRI) 0.15-30 MG-MCG tablet, Take 1 tablet by mouth daily., Disp: 1 tablet, Rfl: 11 estradiol (ESTRACE) 2 MG tablet, Take 1 tablet (2 mg total) by mouth daily., Disp: 30 tablet, Rfl: 11 ketorolac (TORADOL) 10 MG tablet, Take 1 tablet (10 mg total) by mouth every 8 (eight) hours as needed. (Patient not taking: Reported on 01/18/2020), Disp: 15 tablet, Rfl: 0 ondansetron (ZOFRAN ODT) 8 MG disintegrating tablet, Take 1 tablet (8 mg total) by mouth every 8 (eight) hours as needed for nausea or vomiting. (Patient not taking: Reported on 01/18/2020), Disp: 8 tablet, Rfl: 0 oxyCODONE-acetaminophen (PERCOCET) 7.5-325 MG tablet, Take 1-2 tablets by mouth every 6 (six) hours as needed. (Patient not taking: Reported on 01/18/2020), Disp: 15 tablet, Rfl: 0  No current facility-administered medications for this visit.    Blood pressure 107/81, pulse (!) 118, height 5' (1.524 m), weight 110 lb (49.9 kg), last menstrual period 03/27/2018.  Physical Exam: 3 incisions all look good Abdomen is normal post op  Diagnostic Tests:   Pathology: bening  Impression: S/p RSO laparoscopic Moody on OCP, try estrogen only with estrace 2 mg orally  Plan: Pt will send mychart message if she needs to regarding her estrogen withdrawal symptoms  Follow up: prn    Lazaro Arms, MD

## 2020-03-29 IMAGING — US US MFM OB TRANSVAGINAL
1 series · 16 of 28 positions shown · non-contrast
Comparison: none

[Series 1: us mfm ob transvaginal · 16 of 104 slices shown]
[im 1/104]
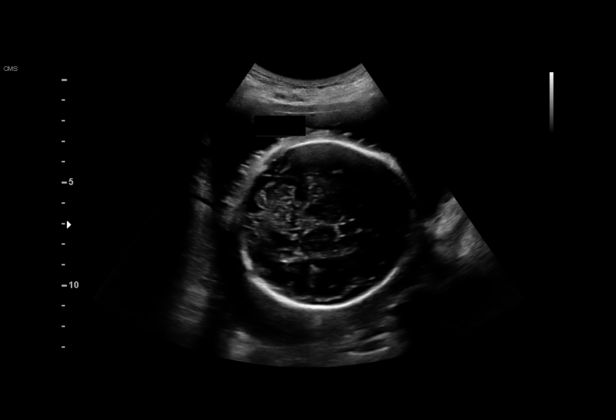
[im 8/104]
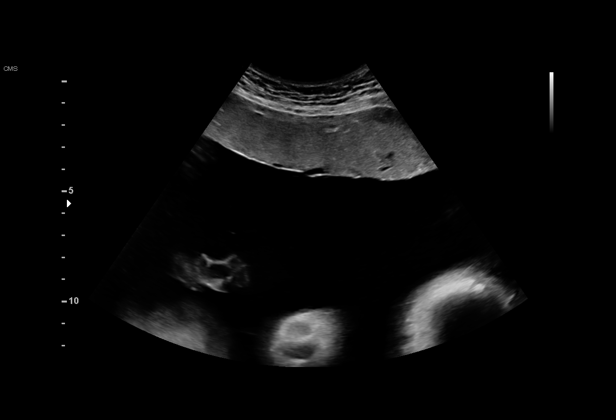
[im 16/104]
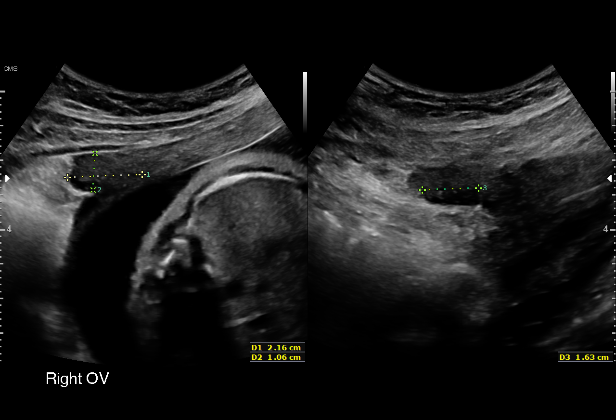
[im 23/104]
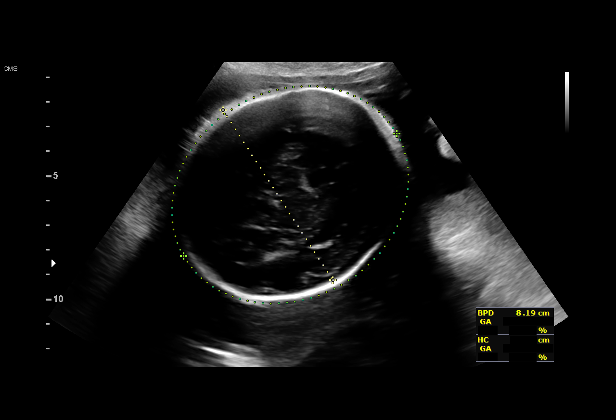
[im 27/104]
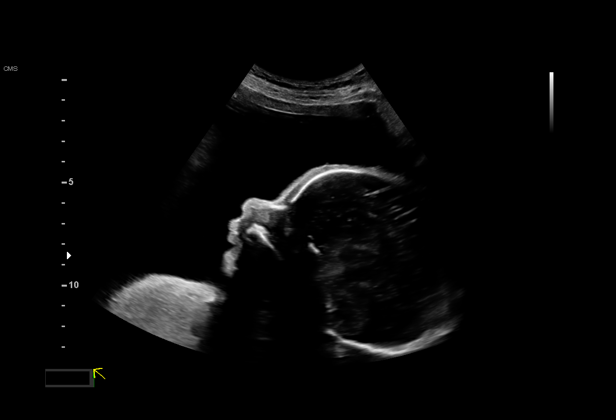
[im 35/104]
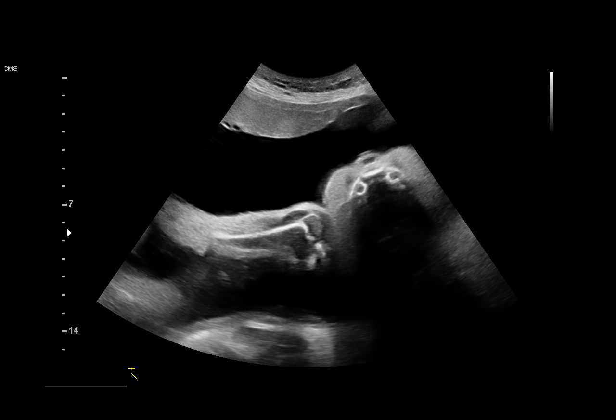
[im 42/104]
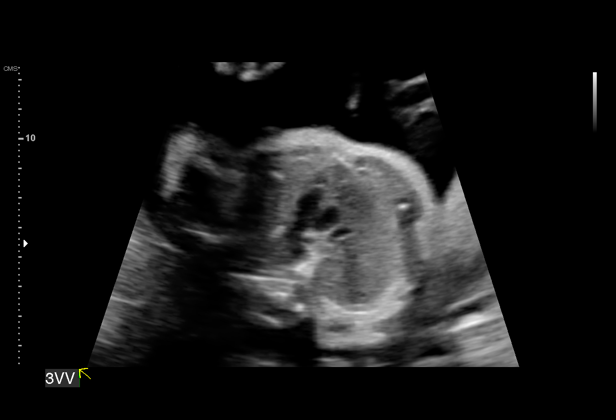
[im 50/104]
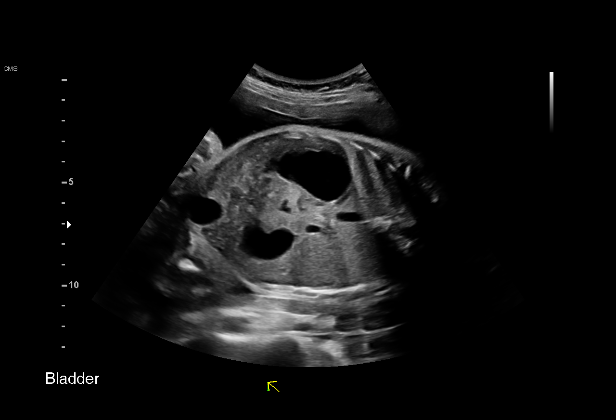
[im 54/104]
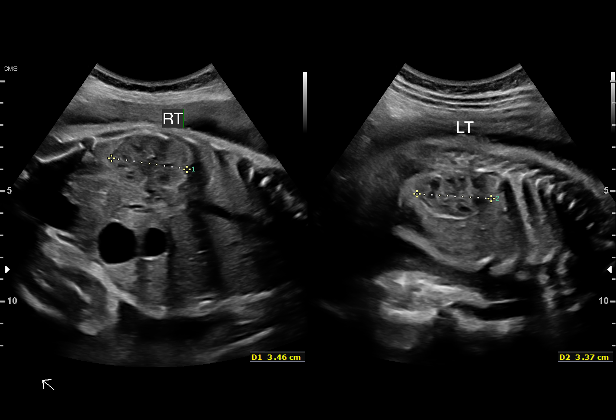
[im 62/104]
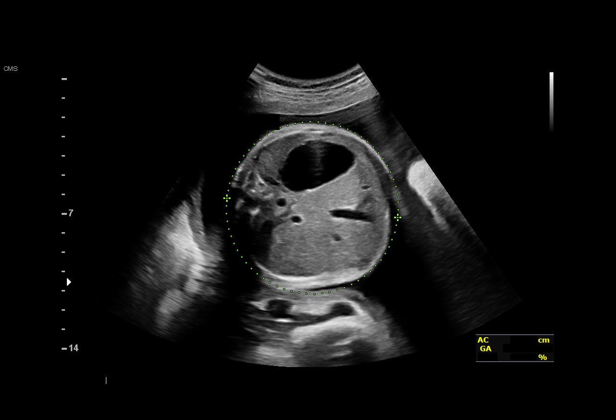
[im 69/104]
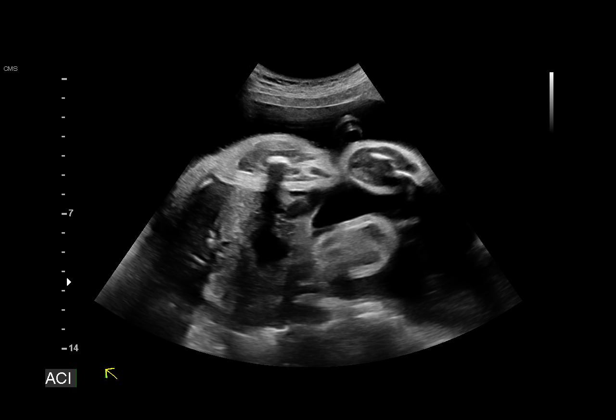
[im 77/104]
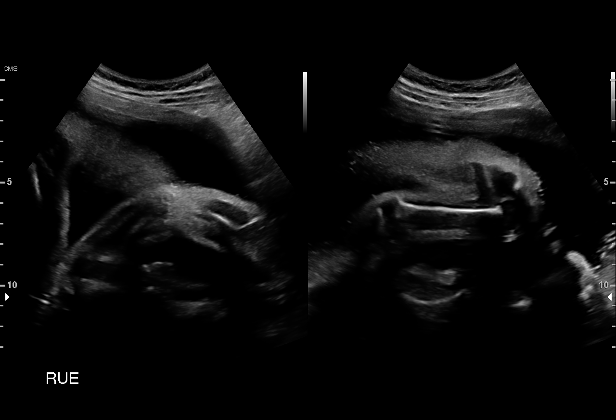
[im 81/104]
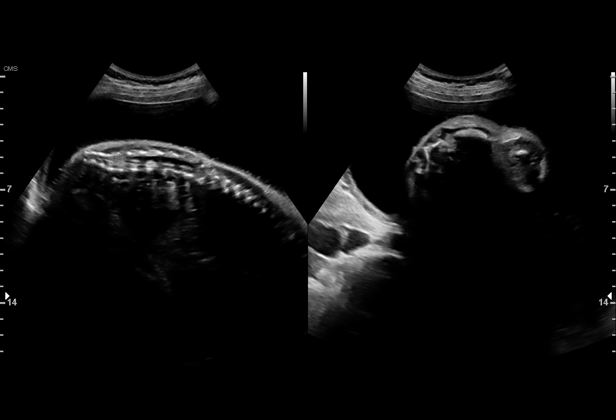
[im 88/104]
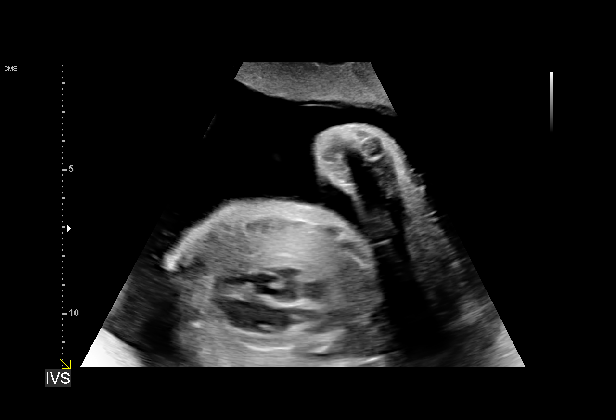
[im 96/104]
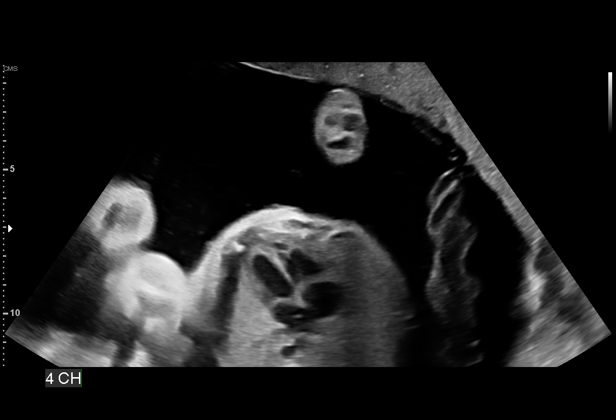
[im 104/104]
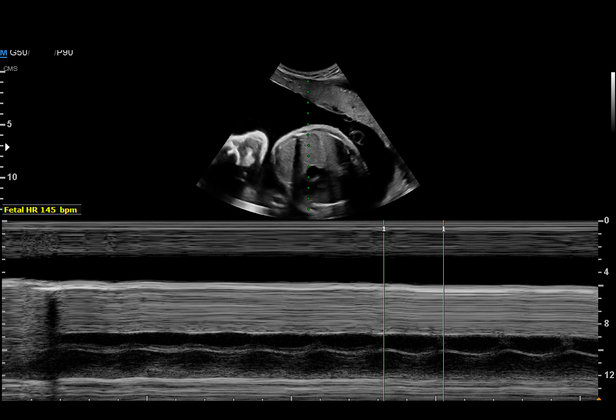

[16 of 28 positions shown; findings below may reference images not displayed]

[HOSPITAL][HOSPITAL]

2  US MFM AMNIOCENTESIS                 76946.01     ESTHEFANY LARREA
3  US MFM OB TRANSVAGINAL               76817.2      ESTHEFANY LARREA

Indications

Encounter for antenatal screening for
malformations
Abnormal fetal ultrasound
Fetal abnormality - other known or
suspected (EIF, double bubble, poly)
Polyhydramnios, third trimester, antepartum
condition or complication, unspecified fetus
Preterm contractions
Previous cesarean delivery, antepartum
30 weeks gestation of pregnancy
Fetal Evaluation

Num Of Fetuses:         1
Fetal Heart Rate(bpm):  154
Cardiac Activity:       Observed
Presentation:           Cephalic
Placenta:               Anterior
P. Cord Insertion:      Visualized

Amniotic Fluid
AFI FV:      Polyhydramnios

AFI Sum(cm)     %Tile       Largest Pocket(cm)
41.75           > 97

RUQ(cm)       RLQ(cm)       LUQ(cm)        LLQ(cm)
8.79
Biometry

BPD:      81.1  mm     G. Age:  32w 4d         93  %    CI:         78.8   %    70 - 86
FL/HC:      20.0   %    19.2 -
HC:      288.9  mm     G. Age:  31w 6d         53  %    HC/AC:      1.02        0.99 -
AC:      282.5  mm     G. Age:  32w 2d         91  %    FL/BPD:     71.3   %    71 - 87
FL:       57.8  mm     G. Age:  30w 2d         31  %    FL/AC:      20.5   %    20 - 24
HUM:        53  mm     G. Age:  30w 6d         61  %
CER:      37.4  mm     G. Age:  32w 1d         72  %

CM:        6.5  mm

Est. FW:    9992  gm           4 lb     76  %
OB History

Gravidity:    2         Term:   1        Prem:   0        SAB:   0
TOP:          0       Ectopic:  0        Living: 1
Gestational Age

LMP:           30w 3d        Date:  03/14/17                 EDD:   12/19/17
U/S Today:     31w 5d                                        EDD:   12/10/17
Best:          30w 3d     Det. By:  LMP  (03/14/17)          EDD:   12/19/17
Anatomy

Cranium:               Appears normal         LVOT:                   Appears normal
Cavum:                 Appears normal         Aortic Arch:            Appears normal
Ventricles:            Appears normal         Ductal Arch:            Appears normal
Choroid Plexus:        Appears normal         Diaphragm:              Appears normal
Cerebellum:            Appears normal         Stomach:                Double bubble
Posterior Fossa:       Appears normal         Abdomen:                Appears normal
Nuchal Fold:           Not applicable (>20    Abdominal Wall:         Appears nml (cord
wks GA)                                        insert, abd wall)
Face:                  Appears normal         Cord Vessels:           Appears normal (3
(orbits and profile)                           vessel cord)
Lips:                  Appears normal         Kidneys:                Appear normal
Palate:                Not well visualized    Bladder:                Appears normal
Thoracic:              Appears normal         Spine:                  Appears normal
Heart:                 Appears normal         Upper Extremities:      Appears normal
(4CH, axis, and
situs)
RVOT:                  Appears normal         Lower Extremities:      Appears normal

Other:  Heels visualized. Nasal bone visualized. Post amnio FHR 145 bpm.
Guided Procedures

Type:   Amniocentesis

FH Post Procedure:     Normal             RH Type:          B Positive
Rh Immune Globulin:    Not required,      Discharge Inst.:  Post-procedure
Rh positive                          instructions
given
Needle Insertions:     22 gauge x 1       Vol. Withdrawn:   33 ml of clear
amniotic fluid
Catheter Passes:       1 pass
Transabdominal:        Yes
Complications:  None
Comment:                    Informed consent was obtained. A "time-out" was performed before the procedure. Patient
tolerated the procedure well. We gave her post-procedure instructions.
Cervix Uterus Adnexa

Cervix
Length:            2.8  cm.

Uterus
No abnormality visualized.

Left Ovary
Surgically absent.

Right Ovary
Within normal limits.

Cul De Sac
No free fluid seen.

Adnexa
No abnormality visualized.
Impression

Ms. Lang, Zara2 P1 at 30-weeks' gestation, is here for a
second opinion. On your office ultrasound, polyhydramnios
and "double-bubble" appearance of stomach were seen.
Obstetric history is significant for a term cesarean delivery.
Patient had opted not to screen for fetal aneuploidies.
She does not have gestational diabetes and her prenatal
course was, otherwise, uneventful. She complained of mild
uterine contractions at your office.

On ultrasound, fetal growth is appropriate for gestational age.
Polyhydramnios is seen (average AFI=39 cm). Abnormal
finding include "double-bubble" appearance in the abdomen.
Cardiac anatomy appears normal. Rest of the fetal anatomy
including spine, intracranial structures and abdominal wall
appear normal.
We performed transvaginal ultrasound to evaluate the cervix.
Shortest cervical length measurement is 2.8 cm with no
funneling (normal).

I counseled the couple on the following:

Duodenal atresia: Ultrasound findings strongly suggest
duodenal atresia that occurs in about 30% of fetal Down
syndrome. I explained intestinal anatomy and the level of
obstruction. Exact mechanism is not known. I informed her
that duodenal atresia leads to swallowing difficulties and
polyhydramnios almost always develops with this condition.

I briefly explained that the neonate requires surgery (in most
cases) immediately after birth. Patient understands that she
needs to deliver in a tertiary-care center with pediatric
surgical facilities.
I discussed and recommended amniocentesis for fetal
karyotype and microarray analysis.
I also discussed amnioreduction that can decrease the
likelihood of preterm delivery especially if she has frequent
uterine contractions. Patient does not have discomfort or
back pain or shortness of breath.
I explained amniocentesis procedure and possible
complications including PPROM and preterm delivery that are
more-likely to occur from polyhydramnios than from the
procedure.
Patient opted to have amniocentesis (not amnioreduction). I
discussed the importance of FISH, karyotype and microarray
that does not detect all genetic conditions.
After informed consent, amniocentesis under ultrasound
guidance was performed by Dr. Tuy and 33 milliliters of
clear amniotic fluid was withdrawn and sent for FISH, fetal
karyotype and microarray analysis. Patient tolerated the
procedure well. Post-procedure fetal heart and NST were
normal. Minimal uterine contractions were seen.
We gave her post-procedure instructions.
Recommendations

-An appointment was made for her to return in 2 weeks for
amniotic fluid assessment.
-Weekly BPP from 33 to 34 weeks.
-Genetic counseling to be discussed on her next visit.
-Patient opted not to have fetal echocardiography.
-Pediatric surgery consultation.

## 2020-06-16 ENCOUNTER — Telehealth: Payer: Self-pay | Admitting: Obstetrics & Gynecology

## 2020-06-16 ENCOUNTER — Other Ambulatory Visit: Payer: Self-pay | Admitting: Obstetrics & Gynecology

## 2020-06-16 NOTE — Telephone Encounter (Signed)
Is she taking the birth control pill or the estradiol? Both are on the list, not sure which one she is taking.

## 2020-06-16 NOTE — Telephone Encounter (Signed)
No there's not an injection  She can take double the estradiol 1 in am and 1 in pm

## 2020-06-16 NOTE — Telephone Encounter (Signed)
Wants increase in Estrogen, states still having sweaty hot flashes at night  [facility] ([address])   Please advise & call pt

## 2020-06-16 NOTE — Telephone Encounter (Signed)
Pt is only on estradiol. She also wants to know if she can do an injection instead.

## 2020-06-17 NOTE — Telephone Encounter (Signed)
Pt informed

## 2020-07-01 ENCOUNTER — Ambulatory Visit: Payer: 59 | Admitting: Obstetrics & Gynecology

## 2020-07-28 ENCOUNTER — Ambulatory Visit: Payer: 59 | Admitting: Adult Health
